# Patient Record
Sex: Female | Born: 1990 | Race: White | Hispanic: No | Marital: Single | Smoking: Current every day smoker
Health system: Southern US, Community
[De-identification: ages and names within clinical notes are randomized; demographics above are authoritative.]

## PROBLEM LIST (undated history)

## (undated) DIAGNOSIS — K589 Irritable bowel syndrome without diarrhea: Secondary | ICD-10-CM

## (undated) DIAGNOSIS — G4700 Insomnia, unspecified: Secondary | ICD-10-CM

## (undated) DIAGNOSIS — F17200 Nicotine dependence, unspecified, uncomplicated: Secondary | ICD-10-CM

## (undated) DIAGNOSIS — F329 Major depressive disorder, single episode, unspecified: Secondary | ICD-10-CM

## (undated) DIAGNOSIS — F32A Depression, unspecified: Secondary | ICD-10-CM

## (undated) DIAGNOSIS — F988 Other specified behavioral and emotional disorders with onset usually occurring in childhood and adolescence: Secondary | ICD-10-CM

## (undated) DIAGNOSIS — F410 Panic disorder [episodic paroxysmal anxiety] without agoraphobia: Secondary | ICD-10-CM

## (undated) DIAGNOSIS — J45909 Unspecified asthma, uncomplicated: Secondary | ICD-10-CM

## (undated) DIAGNOSIS — F419 Anxiety disorder, unspecified: Secondary | ICD-10-CM

## (undated) HISTORY — DX: Major depressive disorder, single episode, unspecified: F32.9

## (undated) HISTORY — PX: COLONOSCOPY: SHX174

## (undated) HISTORY — DX: Insomnia, unspecified: G47.00

## (undated) HISTORY — DX: Unspecified asthma, uncomplicated: J45.909

## (undated) HISTORY — DX: Nicotine dependence, unspecified, uncomplicated: F17.200

## (undated) HISTORY — DX: Irritable bowel syndrome, unspecified: K58.9

## (undated) HISTORY — DX: Other specified behavioral and emotional disorders with onset usually occurring in childhood and adolescence: F98.8

## (undated) HISTORY — DX: Panic disorder (episodic paroxysmal anxiety): F41.0

## (undated) HISTORY — DX: Anxiety disorder, unspecified: F41.9

## (undated) HISTORY — DX: Depression, unspecified: F32.A

---
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
## 2009-05-21 ENCOUNTER — Encounter: Admission: RE | Admit: 2009-05-21 | Discharge: 2009-05-21 | Payer: Self-pay | Admitting: Family Medicine

## 2009-12-24 ENCOUNTER — Ambulatory Visit: Payer: Self-pay | Admitting: Family Medicine

## 2009-12-24 DIAGNOSIS — G4700 Insomnia, unspecified: Secondary | ICD-10-CM | POA: Insufficient documentation

## 2009-12-24 DIAGNOSIS — J45909 Unspecified asthma, uncomplicated: Secondary | ICD-10-CM | POA: Insufficient documentation

## 2009-12-24 DIAGNOSIS — F410 Panic disorder [episodic paroxysmal anxiety] without agoraphobia: Secondary | ICD-10-CM

## 2009-12-24 HISTORY — DX: Insomnia, unspecified: G47.00

## 2009-12-24 HISTORY — DX: Panic disorder (episodic paroxysmal anxiety): F41.0

## 2009-12-24 HISTORY — DX: Unspecified asthma, uncomplicated: J45.909

## 2010-02-08 ENCOUNTER — Ambulatory Visit: Payer: Self-pay | Admitting: Family Medicine

## 2010-02-08 ENCOUNTER — Telehealth: Payer: Self-pay | Admitting: Family Medicine

## 2010-04-22 ENCOUNTER — Encounter: Payer: Self-pay | Admitting: Family Medicine

## 2010-04-22 ENCOUNTER — Ambulatory Visit
Admission: RE | Admit: 2010-04-22 | Discharge: 2010-04-22 | Payer: Self-pay | Source: Home / Self Care | Attending: Family Medicine | Admitting: Family Medicine

## 2010-04-22 ENCOUNTER — Other Ambulatory Visit: Payer: Self-pay | Admitting: Family Medicine

## 2010-04-22 DIAGNOSIS — R1084 Generalized abdominal pain: Secondary | ICD-10-CM | POA: Insufficient documentation

## 2010-04-22 LAB — CBC WITH DIFFERENTIAL/PLATELET
Basophils Absolute: 0 10*3/uL (ref 0.0–0.1)
Basophils Relative: 0.5 % (ref 0.0–3.0)
Eosinophils Absolute: 0.3 10*3/uL (ref 0.0–0.7)
Eosinophils Relative: 3 % (ref 0.0–5.0)
HCT: 43.5 % (ref 36.0–46.0)
Hemoglobin: 14.8 g/dL (ref 12.0–15.0)
Lymphocytes Relative: 33.2 % (ref 12.0–46.0)
Lymphs Abs: 2.8 10*3/uL (ref 0.7–4.0)
MCHC: 34.1 g/dL (ref 30.0–36.0)
MCV: 91.8 fl (ref 78.0–100.0)
Monocytes Absolute: 0.5 10*3/uL (ref 0.1–1.0)
Monocytes Relative: 5.9 % (ref 3.0–12.0)
Neutro Abs: 4.9 10*3/uL (ref 1.4–7.7)
Neutrophils Relative %: 57.4 % (ref 43.0–77.0)
Platelets: 223 10*3/uL (ref 150.0–400.0)
RBC: 4.74 Mil/uL (ref 3.87–5.11)
RDW: 13.2 % (ref 11.5–14.6)
WBC: 8.5 10*3/uL (ref 4.5–10.5)

## 2010-04-22 LAB — SEDIMENTATION RATE: Sed Rate: 4 mm/hr (ref 0–22)

## 2010-04-27 ENCOUNTER — Telehealth: Payer: Self-pay | Admitting: Family Medicine

## 2010-05-05 ENCOUNTER — Encounter: Payer: Self-pay | Admitting: Family Medicine

## 2010-05-05 ENCOUNTER — Ambulatory Visit
Admission: RE | Admit: 2010-05-05 | Discharge: 2010-05-05 | Payer: Self-pay | Source: Home / Self Care | Attending: Family Medicine | Admitting: Family Medicine

## 2010-05-05 ENCOUNTER — Ambulatory Visit
Admission: RE | Admit: 2010-05-05 | Discharge: 2010-05-05 | Payer: Self-pay | Source: Home / Self Care | Attending: Women's Health | Admitting: Women's Health

## 2010-05-05 DIAGNOSIS — R197 Diarrhea, unspecified: Secondary | ICD-10-CM | POA: Insufficient documentation

## 2010-05-05 NOTE — Progress Notes (Unsigned)
  Subjective:    Patient ID: Leslie [name], female    DOB: 17-Dec-1990, 19 y.o.   MRN: [id number]  HPIPatient is seen with persistent frequent loose stools sometimes up to 10 per day. She's had several years of frequent abdominal symptoms including bilateral lower abdominal cramp-like pains in predominantly frequent loose stools. Rare issues of constipation. No bloody stools. Previous EGD and colonoscopy around 2009. We do not have those records at this time. She notices that several things exacerbate her symptoms including smoking, caffeine, carbonated beverages, and dairy products. She has tried align without improvement.  Activity with minimal improvement. She tried dicyclomine with minimal improvement. Had some improvement first couple days but none since then.  Denies any weight changes. Appetite is fair. She is afraid he eats certain foods that exacerbate. Denies any fever, arthralgias, skin rashes.    Review of Systems  Constitutional: Negative for fever, chills, fatigue and unexpected weight change.  Respiratory: Negative for cough and shortness of breath.   Cardiovascular: Negative for chest pain.  Gastrointestinal: Positive for abdominal pain and diarrhea. Negative for nausea (occasional nausea but no vomiting), vomiting, constipation, blood in stool, abdominal distention, anal bleeding and rectal pain.  Musculoskeletal: Negative for arthralgias.  Skin: Negative for color change and rash.       Objective:   Physical Exam  Constitutional: She is oriented to person, place, and time. She appears well-developed and well-nourished.  HENT:  Head: Normocephalic and atraumatic.  Neck: No thyromegaly present.  Cardiovascular: Normal rate and regular rhythm.   Pulmonary/Chest: No respiratory distress. She has no wheezes. She has no rales.  Abdominal: Soft. Bowel sounds are normal. She exhibits no distension and no mass. There is no tenderness. There is no rebound and no guarding.    Lymphadenopathy:    She has no cervical adenopathy.  Neurological: She is alert and oriented to person, place, and time.  Skin: No rash noted.          Assessment & Plan:  Patient has several year history of intermittent abdominal cramping and diarrhea per dominance. Previous endoscopy unrevealing. Question IBS. As previously seen gastroenterologist and not clear if other lab work done. Recent CBC and sedimentation rate unremarkable. No red flags such as weight loss. We'll plan referral back to gastroenterology. Discussed avoidance of dietary triggers. Short-term trial of Lomotil 2 tablets every 6 hours p.r.n. For severe symptoms.  She will call with name of GI she has seen previously.

## 2010-05-05 NOTE — Patient Instructions (Signed)
Irritable Bowel Syndrome   (Spastic Colon)   Irritable Bowel Syndrome (IBS) is caused by a disturbance of normal bowel function. Other terms used are spastic colon, mucous colitis, and irritable colon. It does not require surgery, nor does it lead to cancer. There is no cure for IBS. But with proper diet, stress reduction, and medication, you will find that your problems (symptoms) will gradually disappear or improve. IBS is a common digestive disorder. It usually appears in late adolescence or early adulthood. Women develop it twice as often as men.   CAUSES   After food has been digested and absorbed in the small intestine, waste material is moved into the colon (large intestine). In the colon, water and salts are absorbed from the undigested products coming from the small intestine. The remaining residue, or fecal material, is held for elimination. Under normal circumstances, gentle, rhythmic contractions on the bowel walls push the fecal material along the colon towards the rectum. In IBS, however, these contractions are irregular and poorly coordinated. The fecal material is either retained too long, resulting in constipation, or expelled too soon, producing diarrhea.   SYMPTOMS   The most common symptom of IBS is pain. It is typically in the lower left side of the belly (abdomen). But it may occur anywhere in the abdomen. It can be felt as heartburn, backache, or even as a dull pain in the arms or shoulders. The pain comes from excessive bowel-muscle spasms and from the buildup of gas and fecal material in the colon. This pain:   Can range from sharp belly (abdominal) cramps to a dull, continuous ache.   Usually worsens soon after eating.   Is typically relieved by having a bowel movement or passing gas.   Abdominal pain is usually accompanied by constipation. But it may also produce diarrhea. The diarrhea typically occurs right after a meal or upon arising in the morning. The stools are typically soft and  watery. They are often flecked with secretions (mucus).   Other symptoms of IBS include:   Bloating.   Loss of appetite.  Heartburn.   Feeling sick to your stomach (nausea).  Belching   Vomiting  Gas.    IBS may also cause a number of symptoms that are unrelated to the digestive system:   Fatigue.   Headaches.  Anxiety   Shortness of breath  Difficulty in concentrating.   Dizziness.    These symptoms tend to come and go.   DIAGNOSIS   The symptoms of IBS closely mimic the symptoms of other, more serious digestive disorders. So your caregiver may wish to perform a variety of additional tests to exclude these disorders. He/she wants to be certain of learning what is wrong (diagnosis). The nature and purpose of each test will be explained to you.   TREATMENT   A number of medications are available to help correct bowel function and/or relieve bowel spasms and abdominal pain. Among the drugs available are:   Mild, non-irritating laxatives for severe constipation and to help restore normal bowel habits.   Specific anti-diarrheal medications to treat severe or prolonged diarrhea.   Anti-spasmodic agents to relieve intestinal cramps.   Your caregiver may also decide to treat you with a mild tranquilizer or sedative during unusually stressful periods in your life.   The important thing to remember is that if any drug is prescribed for you, make sure that you take it exactly as directed. Make sure that your caregiver knows how well it worked   for you.   HOME CARE INSTRUCTIONS   Avoid foods that are high in fat or oils. Some examples are:heavy cream, butter, frankfurters, sausage, and other fatty meats.   Avoid foods that have a laxative effect, such as fruit, fruit juice, and dairy products.   Cut out carbonated drinks, chewing gum, and "gassy" foods, such as beans and cabbage. This may help relieve bloating and belching.   Bran taken with plenty of liquids may help relieve constipation.   Keep track of what foods seem to  trigger your symptoms.   Avoid emotionally charged situations or circumstances that produce anxiety.   Start or continue exercising.   Get plenty of rest and sleep.   MAKE SURE YOU:   Understand these instructions.   Will watch your condition.   Will get help right away if you are not doing well or get worse.   Document Released: 03/28/2005 Document Re-Released: 08/14/2008   Exit[employer]® Patient Information ©2011 [employer].

## 2010-05-06 ENCOUNTER — Telehealth: Payer: Self-pay | Admitting: Family Medicine

## 2010-05-10 ENCOUNTER — Telehealth: Payer: Self-pay | Admitting: Family Medicine

## 2010-05-13 NOTE — Progress Notes (Signed)
Summary: wcb FOR APPT 10-31  Phone Note Call from Patient Call back at Home Phone [phone]   Caller: vm Summary of Call: Lately anxiety especially at night.  Have to be up super early for new job.  Hard to take med Xanax & be awake morning.  Requesting Klonipin again, has been on in past.  Don't have same side effects with that.  [employer].     Initial call taken by: Rudy Jew, RN,  February 08, 2010 12:04 PM  Follow-up for Phone Call        needs follow up to discuss. Follow-up by: [name] MD,  February 08, 2010 1:17 PM  Additional Follow-up for Phone Call Additional follow up Details #1::        wcb ABOUT 4:00 OV.   Additional Follow-up by: Rudy Jew, RN,  February 08, 2010 2:04 PM

## 2010-05-13 NOTE — Letter (Signed)
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   [place], [place] [address]   Phone: [phone]  Fax: [phone]    April 22, 2010   Employee:  Kelli Churn, daughter of Katniss Weedman.    To Whom It May Concern:   For Medical reasons, please excuse the above named employee from work for the following dates:  Start:   03/23/2011  End:   03/23/2011  If you need additional information, please feel free to contact our office.         Sincerely,        [name], MD

## 2010-05-13 NOTE — Assessment & Plan Note (Signed)
Summary: med issue/per pam//ccm   Vital Signs:  Patient profile:   20 year old female Menstrual status:  regular Weight:      100 pounds Temp:     98.4 degrees F oral BP sitting:   100 / 64  (left arm) Cuff size:   regular  Vitals Entered By: Sid Falcon LPN (February 08, 2010 4:00 PM)  History of Present Illness: Patient for followup regarding anxiety issues. History of questionable panic attacks. She has been very reluctant to go on antidepressant medication such as sertraline. Has used low-dose alprazolam but recently has felt this made her more sedated when she took at night. In the past she has used clonazepam and had fewer side effects. She is requesting change of medication. No alcohol use.  Allergies (verified): No Known Drug Allergies  Past History:  Past Medical History: Last updated: 12/24/2009 Asthma Panic Attack Insomnia lactose intolerance PMH reviewed for relevance  Review of Systems  The patient denies anorexia, weight loss, and depression.    Physical Exam  General:  Well-developed,well-nourished,in no acute distress; alert,appropriate and cooperative throughout examination Neck:  No deformities, masses, or tenderness noted. Lungs:  Normal respiratory effort, chest expands symmetrically. Lungs are clear to auscultation, no crackles or wheezes. Heart:  Normal rate and regular rhythm. S1 and S2 normal without gallop, murmur, click, rub or other extra sounds. Psych:  normally interactive, good eye contact, not anxious appearing, and not depressed appearing.     Impression & Recommendations:  Problem # 1:  PANIC ATTACK (ICD-300.01)  Her updated medication list for this problem includes:    Clonazepam 0.5 Mg Tabs (Clonazepam) ..... One by mouth two times a day as needed severe anxiety  Complete Medication List: 1)  Clonazepam 0.5 Mg Tabs (Clonazepam) .... One by mouth two times a day as needed severe anxiety  Patient Instructions: 1)  Please schedule  a follow-up appointment in 3 months .  Prescriptions: CLONAZEPAM 0.5 MG TABS (CLONAZEPAM) one by mouth two times a day as needed severe anxiety  #60 x 3   Entered and Authorized by:   [name] MD   Signed by:   [name] MD on 02/08/2010   Method used:   Print then Give to Patient   RxID:   [id number]    Orders Added: 1)  Est. Patient Level III [53664]

## 2010-05-13 NOTE — Letter (Signed)
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         Sincerely,        [name], MD

## 2010-05-13 NOTE — Progress Notes (Signed)
Summary: Ongoing abd pain, GI referral  Phone Note Call from Patient   Caller: Patient Call For: [name] MD Summary of Call: GI MD Charlott Rakes, Deboraha Sprang [facility] Needs appt ASAP to a GI MD Still abd. Pain [phone]  Initial call taken by: [facility] CMA AAMA,  May 06, 2010 3:13 PM  Follow-up for Phone Call        I recommend setting up appt with Dr Bosie Clos to re-evaluate, if she is agreeable.  Will set  up. Follow-up by: [name] MD,  May 06, 2010 5:25 PM  Additional Follow-up for Phone Call Additional follow up Details #1::        Message left fot pt referral has been made Additional Follow-up by: Sid Falcon LPN,  May 06, 2010 5:36 PM

## 2010-05-13 NOTE — Assessment & Plan Note (Signed)
Summary: fup//ccm   Vital Signs:  Patient profile:   20 year old female Menstrual status:  regular Weight:      104 pounds BMI:     18.06 Temp:     98.5 degrees F oral BP sitting:   110 / 70  (left arm) Cuff size:   regular  Vitals Entered By: Sid Falcon LPN (May 05, 2010 4:34 PM) CC: abd pain   History of Present Illness: Patient seen with long history of intermittent lower abdominal cramping sensation and frequent loose stools. Reported workup by gastroenterologist with EGD and colonoscopy 2009 reportedly unremarkable. ? of IBS.  Patient has sometimes up to 10 loose non-watery and nonbloody stools per day. Exacerbated by things like smoking, caffeine, carbonated beverages, and dairy products. She has tried probiotics without improvement. Recent dicyclomine helped for a few days but not subsequently. She has been eating very little for diet recently with crackers, bread, and soup.  She has tried lactose avoidance in past without improvement.  No recent weight change. Denies any bloody stools, fever, arthralgias, or skin rashes.  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    Physical Exam  General:  Well-developed,well-nourished,in no acute distress; alert,appropriate and cooperative throughout examination Mouth:  Oral mucosa and oropharynx without lesions or exudates.  Teeth in good repair. Neck:  No deformities, masses, or tenderness noted. Lungs:  Normal respiratory effort, chest expands symmetrically. Lungs are clear to auscultation, no crackles or wheezes. Heart:  Normal rate and regular rhythm. S1 and S2 normal without gallop, murmur, click, rub or other extra sounds. Abdomen:  soft, non-tender, normal bowel sounds, no distention, no masses, no hepatomegaly, and no splenomegaly.   Skin:  no rashes and no suspicious lesions.   Cervical Nodes:  No lymphadenopathy noted Psych:  normally interactive, good eye contact, not anxious appearing, and not depressed appearing.     Impression & Recommendations:  Problem # 1:  DIARRHEA (ICD-787.91) chronic and intermittent.  ? of IBS.  Discussed possible dietary triggers.  Short term trial of Lomotil and refer back to GI. she will call back with name of GI she has seen previously. Her updated medication list for this problem includes:    Lomotil 2.5-0.025 Mg Tabs (Diphenoxylate-atropine) .Marland Kitchen... 2 tablets qid as needed diarrhea  Complete Medication List: 1)  Clonazepam 0.5 Mg Tabs (Clonazepam) .... One by mouth two times a day as needed severe anxiety 2)  Yaz 3-0.02 Mg Tabs (Drospirenone-ethinyl estradiol) .... Daily as directed 3)  Lomotil 2.5-0.025 Mg Tabs (Diphenoxylate-atropine) .... 2 tablets qid as needed diarrhea  Patient Instructions: 1)  Call with name of gastroenterologist you have seen previously. Prescriptions: LOMOTIL 2.5-0.025 MG TABS (DIPHENOXYLATE-ATROPINE) 2 tablets qid as needed diarrhea  #40 x 1   Entered and Authorized by:  [name] MD   Signed by:   [name] MD on 05/05/2010   Method used:   Print then Give to Patient   RxID:   [id number]    Orders Added: 1)  Est. Patient Level III [14782]

## 2010-05-13 NOTE — Assessment & Plan Note (Signed)
Summary: ab issues//ccm   Vital Signs:  Patient profile:   20 year old female Menstrual status:  regular Weight:      102 pounds Temp:     98.0 degrees F oral BP sitting:   110 / 72  (left arm) Cuff size:   regular  Vitals Entered By: Sid Falcon LPN (April 22, 2010 2:43 PM)  History of Present Illness: Patient seen with abdominal pain somewhat migratory and intermittent. Location is bilateral and lower abdomen. Similar symptoms going back at least 3 years ago. Extensive testing at that time with CAT scan, EGD, and colonoscopy unremarkable. She tends to have more frequent stools sometimes alternating with constipation. Worse with caffeine and dairy products. No bloody stools. Appetite and weight are stable. No fever.  Denies any specific stressors.  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    Physical Exam  General:  Well-developed,well-nourished,in no acute distress; alert,appropriate and  cooperative throughout examination Mouth:  Oral mucosa and oropharynx without lesions or exudates.  Teeth in good repair. Neck:  No deformities, masses, or tenderness noted. Lungs:  Normal respiratory effort, chest expands symmetrically. Lungs are clear to auscultation, no crackles or wheezes. Heart:  Normal rate and regular rhythm. S1 and S2 normal without gallop, murmur, click, rub or other extra sounds. Abdomen:  soft, non-tender, normal bowel sounds, no distention, no masses, no guarding, no rigidity, no rebound tenderness, no abdominal hernia, no hepatomegaly, and no splenomegaly.   Extremities:  No clubbing, cyanosis, edema, or deformity noted with normal full range of motion of all joints.   Neurologic:  alert & oriented X3 and cranial nerves II-XII intact.   Skin:  no rashes.     Impression & Recommendations:  Problem # 1:  ABDOMINAL PAIN, GENERALIZED (ICD-789.07)  suspect probable IBS.  Extensive workup in past.  Alternating constipation and diarrhea go along with likely dx and no  red flags such as weight loss, fever, etc,  Labs as below and trial of Bentyl for as needed use. Avoidance of aggravating foods.  GI refer if persists.  Orders: Venipuncture (04540) Specimen Handling (98119) TLB-CBC Platelet - w/Differential (85025-CBCD) TLB-Sedimentation Rate (ESR) (85652-ESR)  Complete Medication List: 1)  Clonazepam 0.5 Mg Tabs (Clonazepam) .... One by mouth two times a day as needed severe anxiety 2)  Yaz 3-0.02 Mg Tabs (Drospirenone-ethinyl estradiol) .... Daily as directed 3)  Dicyclomine Hcl 20 Mg Tabs (Dicyclomine hcl) .... One by mouth q 6 hours as needed  Patient Instructions: 1)  Follow up promptly for any fever, worsening abdominal pain, bloody stools, or any weight loss Prescriptions: DICYCLOMINE HCL 20 MG TABS (DICYCLOMINE HCL) one by mouth q 6 hours as needed  #40 x 1   Entered and Authorized by:   [name] MD  Signed by:   [name] MD on  04/22/2010   Method used:   Electronically to        [id number]       [address]       [place], [place]  [address]       Ph: [phone] or [phone]       Fax: [phone]   RxID:   [id number]    Orders Added: 1)  Venipuncture [43329] 2)  Specimen Handling [99000] 3)  TLB-CBC Platelet - w/Differential [85025-CBCD] 4)  TLB-Sedimentation Rate (ESR) [85652-ESR] 5)  Est. Patient Level IV [51884]

## 2010-05-13 NOTE — Assessment & Plan Note (Signed)
Summary: BRAND NEW PT/TO EST/CJR   Vital Signs:  Patient profile:   20 year old female Menstrual status:  regular LMP:     12/17/2009 Height:      63.75 inches Weight:      97 pounds Temp:     98.8 degrees F oral Pulse rate:   88 / minute Pulse rhythm:   regular Resp:     12 per minute BP sitting:   118 / 70  (left arm) Cuff size:   regular  Vitals Entered By: Sid Falcon LPN (December 24, 2009 3:25 PM) CC: New to establish, panic attacks LMP (date): 12/17/2009     Menstrual Status regular Enter LMP: 12/17/2009   History of Present Illness: Patient seen to establish care. She has history of reported panic disorder. She's taken alprazolam 1 mg as needed. At one point was on Effexor but had significant side effects. Denies history of depression. She has some chronic intermittent insomnia.  Drinks freq [employer].  Has difficulty falling asleep.   Reported lactose intolerance. Also has history of ADD but does not take Adderall regularly. No known drug allergies.  Family history as recorded.  Social history she is out of school and working. No alcohol use. Smokes less than one quarter pack cigarettes per day.  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 Smoker Alcohol use-yes Regular exercise-no  Risk Factors: Exercise: no (12/24/2009)  Risk Factors: Smoking Status: current  (12/24/2009) Packs/Day: 0.25 (12/24/2009)  Past Medical History: Asthma Panic Attack Insomnia lactose intolerance  Past Surgical History: Colonscopy 2009  endscopy 2009 PMH-FH-SH reviewed for relevance  Family History: Family History of Alcoholism/Addiction, fathers brothers Family History High cholesterol, mother, maternal grandfather Hypertension, heart disease, diabetes, maternal grandfather emotional illness, mother, grandmother  Social History: Occupation:  Conservation officer, 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    Physical Exam  General:  Well-developed,well-nourished,in no acute distress; alert,appropriate and cooperative throughout examination Mouth:  Oral mucosa and oropharynx without lesions or exudates.  Teeth in good repair. Neck:  No deformities, masses, or tenderness noted. Lungs:  Normal respiratory effort, chest expands symmetrically. Lungs are clear to auscultation, no crackles or wheezes. Heart:  Normal rate and regular rhythm. S1 and S2 normal without gallop, murmur, click, rub or other extra sounds. Psych:  normally interactive, good eye contact, not anxious appearing, and not depressed appearing.     Impression & Recommendations:  Problem # 1:  PANIC ATTACK (ICD-300.01) refilled alprazolam but discussed that we need to look at controlling meds such as Sertraline if her anxiety is increasing in freq or severity. Her updated medication list for this problem includes:    Alprazolam 1 Mg Tabs (Alprazolam) ..... One by mouth q 8 hours as needed panic attack  Problem #  2:  ADD (ICD-314.00)  Problem # 3:  INSOMNIA, TRANSIENT  (ICD-780.52) sleep hygiene discussed.  Reduce caffeine gradually.  Complete Medication List: 1)  Alprazolam 1 Mg Tabs (Alprazolam) .... One by mouth q 8 hours as needed panic attack  Patient Instructions: 1)  Please schedule a follow-up appointment in 6 months .  Prescriptions: ALPRAZOLAM 1 MG TABS (ALPRAZOLAM) one by mouth q 8 hours as needed panic attack  #30 x 2   Entered and Authorized by:   [name] MD   Signed by:   [name] MD on 12/24/2009   Method used:   Print then Give to Patient   RxID:   [id number]

## 2010-05-13 NOTE — Progress Notes (Signed)
Summary: no early refill  Phone Note Call from Patient Call back at Home Phone [phone]   Caller: Patient Call For: [name] MD Summary of Call: [facility]) Pt is early on Klonipin 5 days early due to stomach pain? Initial call taken by: [facility] CMA AAMA,  April 27, 2010 11:59 AM  Follow-up for Phone Call        she should not be taking this excessive to prescribed dose. Follow-up by: [name] MD,  April 27, 2010 12:30 PM  Additional Follow-up for Phone Call Additional follow up Details #1::        notified pt. Additional Follow-up by: Lynann Beaver CMA AAMA,  April 27, 2010 1:49 PM

## 2010-05-19 NOTE — Progress Notes (Signed)
  Phone Note Call from Patient Call back at [facility] Phone [phone]   Caller: Patient Call For: [name] MD Summary of Call: Pt needs another appt with someone other than [facility].......they say she owes a bill and she does not want to see them.  Initial call taken by: [facility] CMA AAMA,  May 10, 2010 12:00 PM  Follow-up for Phone Call        go ahead and set up with [facility] if they are willing to see. Follow-up by: [name] MD,  May 10, 2010 1:09 PM  Additional Follow-up for Phone Call Additional follow up Details #1::        Sent request to [facility] to set up consult. Additional Follow-up by: Corky Mull,  May 10, 2010 1:59 PM

## 2010-05-31 ENCOUNTER — Ambulatory Visit (INDEPENDENT_AMBULATORY_CARE_PROVIDER_SITE_OTHER): Payer: Managed Care, Other (non HMO) | Admitting: Family Medicine

## 2010-05-31 ENCOUNTER — Encounter: Payer: Self-pay | Admitting: Family Medicine

## 2010-05-31 DIAGNOSIS — R109 Unspecified abdominal pain: Secondary | ICD-10-CM

## 2010-05-31 MED ORDER — HYDROCODONE-ACETAMINOPHEN 5-325 MG PO TABS: 1.0000 | ORAL_TABLET | Freq: Four times a day (QID) | ORAL | Status: AC | PRN

## 2010-05-31 NOTE — Progress Notes (Signed)
  Subjective:    Patient ID: Leslie [name], female    DOB: 12/12/1990, 20 y.o.   MRN: [id number]  HPI  Patient seen with recurrent diffuse bilateral lower abdominal cramps. Refer to prior visit in January.  She's had previous extensive workup with CT scan, EGD, and colonoscopy a few years ago unrevealing. Recent CBC and sedimentation rate unremarkable.    Symptom location is lower abdomen bilaterally with cramp-like discomfort which is severe at times. Some mild relief with bentyl. Has also tried Lomotil for intermittent diarrhea symptoms with some relief. No significant bloody stools. No appetite or weight changes. No fever or chills. She has tried probiotics occasionally without much improvement. Patient was previously seen gastroenterologist with [facility] but is requesting referral to another group.    family history of endometriosis. Patient does not correlate current symptoms with menstrual cycle. As previously noted exacerbation with dairy products and caffeine which she tries to avoid.   Review of Systems  Constitutional: Negative for fever, chills, diaphoresis, activity change and unexpected weight change.  HENT: Negative for congestion.   Respiratory: Negative for shortness of breath.   Cardiovascular: Negative for chest pain and palpitations.  Gastrointestinal: Positive for nausea, abdominal pain and diarrhea. Negative for vomiting, blood in stool and abdominal distention.  Genitourinary: Negative for dysuria, frequency and hematuria.  Skin: Negative for rash.  Hematological: Negative for adenopathy.       Objective:   Physical Exam  patient is alert thin relatively healthy-appearing female in no distress  Oropharynx is clear with moist mucosa  Neck supple no adenopathy no mass  Chest clear to auscultation Heart regular rhythm and rate with no murmur Abdomen is nondistended and normal bowel sounds. Soft with mild to moderate bilateral lower quadrant tenderness. No guarding or  rebound. No organomegaly noted       Assessment & Plan:   recurrent lower abdominal pain with associated intermittent diarrhea sometimes alternating with constipation. Gastroenterology referral in process. Continue Bentyl for symptomatic relief.   We discussed possible exacerbating factors such as stress and possible dietary factors. She's tried probiotics without relief.

## 2010-06-21 ENCOUNTER — Telehealth: Payer: Self-pay | Admitting: Family Medicine

## 2010-06-21 NOTE — Telephone Encounter (Signed)
Pt needs a referral expedite  to see dr stark for abd pain

## 2010-06-22 ENCOUNTER — Telehealth: Payer: Self-pay | Admitting: *Deleted

## 2010-06-22 NOTE — Telephone Encounter (Signed)
Faxed request to refill Clonazepam 0.5, one po bid as needed for severe anxiety Last OV 05/31/10 [facility]

## 2010-06-23 MED ORDER — CLONAZEPAM 0.5 MG PO TABS: 0.5000 mg | ORAL_TABLET | Freq: Two times a day (BID) | ORAL | Status: DC | PRN

## 2010-06-23 NOTE — Telephone Encounter (Signed)
Rx called in 

## 2010-06-23 NOTE — Telephone Encounter (Signed)
May refill for 6 months.  She should not be exceeding one po bid.

## 2010-06-24 ENCOUNTER — Other Ambulatory Visit: Payer: Self-pay | Admitting: *Deleted

## 2010-06-25 NOTE — Telephone Encounter (Signed)
Opened by mistake.

## 2010-07-01 ENCOUNTER — Telehealth: Payer: Self-pay | Admitting: *Deleted

## 2010-07-01 MED ORDER — HYDROCODONE-ACETAMINOPHEN 5-325 MG PO TABS: 1.0000 | ORAL_TABLET | Freq: Four times a day (QID) | ORAL | Status: DC | PRN

## 2010-07-01 NOTE — Telephone Encounter (Signed)
Pt requesting refill of Hydrocodone 5-325 for stomach problems.  She has been referred to GI, has appt at [facility] on Monday 3/26.  She ran out of meds and is concerned about the weekend

## 2010-07-01 NOTE — Telephone Encounter (Signed)
Rx called in, pt informed

## 2010-07-01 NOTE — Telephone Encounter (Signed)
We can do limited refill of #20 tablets (Vicodin 5/325 mg 1-2 po q 6 hours prn) but should not be using regularly-could constipate and worsen symptoms.

## 2010-07-21 ENCOUNTER — Telehealth: Payer: Self-pay | Admitting: *Deleted

## 2010-07-21 NOTE — Telephone Encounter (Signed)
hydrocodone 5-325 refill request #20.  Last filled 07/01/10, sig 1-2 tabs every 6 hours prn pain Last OV 05/23/10 Faxed request from [facility]

## 2010-07-21 NOTE — Telephone Encounter (Signed)
Pt is asking for refill of Hydrocodone for abd pain.   Saw GI but they will not give her meds until testing is done.  Has appt with Dr. Caryl Never tomorrow for joint pain and anxiety with abd pain.

## 2010-07-21 NOTE — Telephone Encounter (Signed)
Recommend assess for pain meds at that time.

## 2010-07-22 ENCOUNTER — Encounter: Payer: Self-pay | Admitting: Family Medicine

## 2010-07-22 ENCOUNTER — Ambulatory Visit (INDEPENDENT_AMBULATORY_CARE_PROVIDER_SITE_OTHER): Payer: Managed Care, Other (non HMO) | Admitting: Family Medicine

## 2010-07-22 VITALS — BP 112/68 | Temp 98.7°F | Wt 107.0 lb

## 2010-07-22 DIAGNOSIS — R062 Wheezing: Secondary | ICD-10-CM

## 2010-07-22 DIAGNOSIS — L309 Dermatitis, unspecified: Secondary | ICD-10-CM

## 2010-07-22 DIAGNOSIS — L259 Unspecified contact dermatitis, unspecified cause: Secondary | ICD-10-CM

## 2010-07-22 DIAGNOSIS — K589 Irritable bowel syndrome without diarrhea: Secondary | ICD-10-CM

## 2010-07-22 MED ORDER — TRIAMCINOLONE ACETONIDE 0.1 % EX CREA: TOPICAL_CREAM | Freq: Two times a day (BID) | CUTANEOUS | Status: DC

## 2010-07-22 MED ORDER — ALBUTEROL SULFATE HFA 108 (90 BASE) MCG/ACT IN AERS: 2.0000 | INHALATION_SPRAY | Freq: Four times a day (QID) | RESPIRATORY_TRACT | Status: DC | PRN

## 2010-07-22 NOTE — Progress Notes (Signed)
  Subjective:    Patient ID: Leslie [name], female    DOB: 03/24/1991, 20 y.o.   MRN: [id number]  HPI Patient here to discuss several items as follows  History of chronic intermittent abdominal pain. Pain is poorly localized and diffuse and crampy like. Has seen multiple gastroenterologists most recently in [place]. Recent upper GI with small bowel follow-through reportedly normal. She has tried multiple medications including Lomotil, dicyclomine, and hyoscyamine although minimal improvement.  Recently with [facility] and received Toradol injection which did not help much. His IV at 10 or weight changes. Test had more diarrhea than constipation. No bloody diarrhea. She has taken occasional hydrocodone in the past and we have explained is not a good treatment for IBS type symptoms.  She is tried a lactose-free diet without much improvement. Also tried gluten-free diet for a brief while and possibly had slight improvement in symptoms. It is not clear if recent gastroenterology workup included any lab work.  Previous CBC and sedimentation rate here normal  Patient has history of chronic anxiety. Currently on Klonopin 0.5 mg twice daily. She is episodes of anxiety associated with bronchospasm. Has taken albuterol in the past is requesting refill.  Questionable history of asthma. Wheezing episodes or few and far between. She does not appear to have persistent asthma. She does do to smoke some.  New issue of slightly scaly rash upper extremities and scattered on trunk.  Rash is pruritic. One area correlates to area of belt buckle.   Review of Systems  Constitutional: Negative for fever, activity change, appetite change and unexpected weight change.  Respiratory: Negative for cough and shortness of breath.   Cardiovascular: Negative for chest pain, palpitations and leg swelling.  Gastrointestinal: Positive for abdominal pain and diarrhea. Negative for nausea, vomiting, constipation and  blood in stool.  Genitourinary: Negative for dysuria.  Neurological: Negative for dizziness and headaches.  Hematological: Negative for adenopathy.       Objective:   Physical Exam  Constitutional: She is oriented to person, place, and time. She appears well-developed and well-nourished. No distress.  Neck: Neck supple. No thyromegaly present.  Cardiovascular: Normal rate, regular rhythm and normal heart sounds.   No murmur heard. Pulmonary/Chest: Effort normal and breath sounds normal. No respiratory distress. She has no wheezes. She has no rales.  Abdominal: Soft. Bowel sounds are normal. She exhibits no distension and no mass. There is no tenderness. There is no rebound and no guarding.  Musculoskeletal: She exhibits no edema.  Lymphadenopathy:    She has no cervical adenopathy.  Neurological: She is alert and oriented to person, place, and time.  Skin:       Patient has scaly rash and patches scattered on the upper extremities and trunk. No vesicles. No pustules.          Assessment & Plan:  #1 long history of intermittent abdominal pain with intermittent associated diarrhea and occasional constipation. Has been referred to gastroenterologist. Melina Fiddler recommended against further use of hydrocodone. We've discussed conservative measures for treating IBS multiple times in the past. She is encouraged followup with gastroenterologist. #2 eczematous rash. Triamcinolone 0.1% cream twice daily as needed #3 chronic anxiety. Continue Klonopin 0.5 mg twice daily #4 mild intermittent asthma. Refill Provera 2 puffs every 4 hours as needed patient strongly encouraged stop smoking

## 2010-07-27 ENCOUNTER — Other Ambulatory Visit: Payer: Self-pay | Admitting: *Deleted

## 2010-07-27 NOTE — Telephone Encounter (Signed)
Refill once but this should be used sparingly for her abdominal pain.

## 2010-07-27 NOTE — Telephone Encounter (Signed)
Faxed request to fill Hydrocodone 5-325, #20.  Last filled 07/01/10

## 2010-07-28 MED ORDER — HYDROCODONE-ACETAMINOPHEN 5-325 MG PO TABS: 1.0000 | ORAL_TABLET | Freq: Four times a day (QID) | ORAL | Status: DC | PRN

## 2010-08-04 ENCOUNTER — Encounter: Payer: Self-pay | Admitting: Family Medicine

## 2010-08-12 ENCOUNTER — Ambulatory Visit (INDEPENDENT_AMBULATORY_CARE_PROVIDER_SITE_OTHER): Payer: Managed Care, Other (non HMO) | Admitting: Women's Health

## 2010-08-12 DIAGNOSIS — N949 Unspecified condition associated with female genital organs and menstrual cycle: Secondary | ICD-10-CM

## 2010-08-13 ENCOUNTER — Ambulatory Visit: Payer: Managed Care, Other (non HMO) | Admitting: Women's Health

## 2010-08-24 ENCOUNTER — Ambulatory Visit (INDEPENDENT_AMBULATORY_CARE_PROVIDER_SITE_OTHER): Payer: Managed Care, Other (non HMO) | Admitting: Gynecology

## 2010-08-24 DIAGNOSIS — R82998 Other abnormal findings in urine: Secondary | ICD-10-CM

## 2010-08-24 DIAGNOSIS — Z113 Encounter for screening for infections with a predominantly sexual mode of transmission: Secondary | ICD-10-CM

## 2010-08-24 DIAGNOSIS — N949 Unspecified condition associated with female genital organs and menstrual cycle: Secondary | ICD-10-CM

## 2010-08-27 ENCOUNTER — Ambulatory Visit (INDEPENDENT_AMBULATORY_CARE_PROVIDER_SITE_OTHER): Payer: Managed Care, Other (non HMO) | Admitting: Gynecology

## 2010-08-27 ENCOUNTER — Other Ambulatory Visit: Payer: Managed Care, Other (non HMO)

## 2010-08-27 DIAGNOSIS — R1032 Left lower quadrant pain: Secondary | ICD-10-CM

## 2010-08-27 DIAGNOSIS — N949 Unspecified condition associated with female genital organs and menstrual cycle: Secondary | ICD-10-CM

## 2010-08-27 NOTE — Discharge Summary (Signed)
NAME:  Leslie [name], Leslie [name] NO.:  [id number]   MEDICAL RECORD NO.:  [id number]          PATIENT TYPE:  INP   LOCATION:  0199                          FACILITY:  [facility]   PHYSICIAN:  Beverly Milch, MD     DATE OF BIRTH:  Sep 12, 1990   DATE OF ADMISSION:  01/16/2004  DATE OF DISCHARGE:  01/21/2004                                 DISCHARGE SUMMARY   IDENTIFYING DATA:  This 20 year old female, ninth grade student at [employer], was admitted from [facility] Access and Intake  where mother was directed by the patient's therapist, Windee Knox-Hietkamp,  as the patient was being progressively dangerously disruptive in her  behavior.  The patient's depression had resolved since her December 16, 2003  [facility] discharge, though mother felt the patient was more  irritable and impulsive.  Mother felt the Effexor was the cause of the  patient's problems.  The patient stated the medication was working well.  The patient indicated that starting and stopping cigarette smoking was a  problem.  The patient had been carving her boyfriend's initials into her  forearm and refused to cooperate with mother or therapist to stop such  behavior.  She had run away from home requiring the police to locate her.  The patient reported in the intake department that she was hearing voices  that sounded evil as though she could see people around the home or school.  She reported seeing people that may not have been there around home and  school January 14, 2004 and reported that voices were calling her name the  day before admission.  The patient is attempting to maintain a [place] social  style and dress.  She expects to be an actress in the future.  She is truant  with decline in their grades and has in-school suspension and detention.  She was admitted primarily because of the auditory hallucinations with the  inability to other secure and define the safety contract  and more  predictable behavior.  She acknowledged drinking to the point of alcohol  intoxication recently and smoking up to two packs per day of cigarettes.  She does not like school and does not approve of parental limits.   HISTORY OF PRESENT ILLNESS:  The patient had been significantly depressed  when hospitalized December 09, 2003 through December 15, 2003 at the  [facility].  At that time, she had failed to improve on a six-  week trial of Lexapro 10 mg daily with ongoing psychotherapy.  She had a  plan at that time to cut her wrist and bleed to death in the bathtub.  She  had a one-year decline in motivation and capacity to function, organizing  some of the reason for her depression as having a friend move to [place].  She was talking to the friend on the phone about suicide recently.  The  patient has been tested for learning disabilities and ADHD by the school  with the only conclusion being that she would warrant some school-based  interventions.  The patient thinks that she has ADHD  and mother thinks the  patient has some learning problem that could be corrected.  She has some  exercise-induced asthma.  There is a maternal family history of depression  including some suicidality.  The patient has been progressively oppositional  and rebellious.  She failed course work in the eighth grade but did pass the  EOG exam.  She was concluded to have dysthymic disorder during her last  hospitalization with atypical features.   INITIAL MENTAL STATUS EXAM:  Dr. Ladona Ridgel noted that the patient had  significant hysteroid features that undermine problem-solving and  interpersonal relations.  She projects blame rather than solving problems.  She minimized the significance of and displaced responsibility for her  runaway behavior and cigarette smoking.  She expected to be released from  the hospital to continue what she was doing.  She had no psychotic symptoms.  She had no hypomanic  or manic symptoms, though her mood was significantly  improved, having mild dysphoria at the most.  She was noted to casually flip  from one idea to the next with concentration seeming poor by Dr. Ladona Ridgel.   LABORATORY DATA:  Urine drug screen was negative with urine creatinine 76  mg/dl.  Urine hCG was negative.   HOSPITAL COURSE AND TREATMENT:  General medical exam, by Vic Ripper,  P.A.-C., noted that the patient acknowledged alcohol intoxication four days  before and smoking five cigarettes daily.  The patient suggested that mother  is depressed.  The patient reported a daily headache and does wear glasses.  She reported a history of asthma, predominantly exertional, and menarche at  age 66 with regular menses.  She denied sexual activity.  She does have a  thin habitus.  She was Tanner stage 3.  Admission weight was 94 pounds with  height of 63 inches.  Her admission weight, during her last hospitalization  end of August, was 100 with discharge weight 98 pounds and height of 63-3/4  inches.  Her discharge weight, at this time, was 94 pounds.  Admission blood  pressure was 102/71 with heart rate of 75 (supine) and 112/59 with heart  rate of 111 (standing).  Vital signs were normal throughout hospital stay  and, at the time of discharge, her sitting blood pressure was 109/73 with  heart rate of 89 and standing blood pressure 104/71 with heart rate of 119.  The patient was provided inpatient treatment for 48 hours, monitoring the  course of symptoms.  The patient maintained that she was asymptomatic and  ready for discharge.  She continued to maintain that cigarette smoking was  the only conflict for she and mother.  Mother cried over the patient's  behavior in family work and concluded that the medications must be changed.  The patient indicated that she did not intend to change.  She did not manifest mania or hypomania.  Though she had disregard and inattention at  times, she  does not have a typical history for ADHD.  She does seem to  desire treatment for such and seems to glorify her substance use and  disruptive behavior patterns.  The patient had been on Effexor for one month  and abrupt discontinuation was undertaken in order to address mother's  concerns that the patient had side effects from the medication.  The patient  did remain cooperative throughout the treatment program but without self-  directed initiative or intent to affect change.  With persistence, she did  define that her only auditory hallucinations had  been one year ago when she  was feeling depressed and cut her arm and thought she heard voices telling  her to kill herself as she was walking down the hall.  She acknowledged that  she has no other actual hallucinations or misperceptions.  She had no SSRI  discontinuation symptoms.  She was feeling somewhat chronically dysphoric at  the time of discharge but mother was pleased that the patient was off of the  Effexor.  Mother felt that the patient was less impulsive and more socially  appropriate.  Mother did work with school staff and administration for  helping the patient catch up at school with a plan in place by time of  discharge.  The final family therapy session addressed ways for the patient  to return home with behavioral shutdown but to re-earn privileges.  The  family requires the patient to disengage from drug-using friends.  The  patient understood the plan including consequences of juvenile detention for  running away.  She was irritable with father over consequences of losing CDs  and posters.  She cried briefly and decathexis as she disengaged from her  dramatic defiance and agreed to be more responsible at home.  Behavioral and  family work are underway.  She is felt to have some chronic dysthymic  disorder and her mood has been better over the last month but she has used  the improved mood to act out more rather than to  improve family and school  functioning.  She does not present significant risk for bipolar disorder at  this time.  However, she has lost 4-6 pounds on Effexor.  She and mother  conclude with all processing that they must wait several weeks to assess  baseline mood again and behavioral impulsivity before they can make a  decision about other pharmacotherapy.  We discussed Wellbutrin as an option  with mother and patient wishing to focus most on assistance for attention  span.  She was free of suicidal ideation and self-injurious behavior at the  time of discharge and was having no hallucinations.   FINAL DIAGNOSES:   AXIS I:  1.  Dysthymic disorder, early onset, severe with atypical features.  2.  Oppositional defiant disorder with hysteroid features.  3.  Rule out psychoactive substance abuse not otherwise specified      (provisional diagnosis). 4.  Rule out attention-deficit hyperactivity disorder, inattentive-type      (provisional diagnosis).  5.  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  6.  Parent-child problem.  7.  Other specified family circumstances.  8.  Other interpersonal problem.   AXIS II:  Rule out learning disorder not otherwise specified (provisional  diagnosis).   AXIS III:  1.  Thin stature but no evidence of eating disorder.  2.  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  3.  Exercise-induced asthma.  4.  Cigarette smoking.   AXIS IV:  Stressors:  Family--severe, acute and chronic; school--severe,  acute and chronic; phase of life--severe, acute and chronic.   AXIS V:  Global Assessment of Functioning on admission 45; highest in last  year 72; discharge Global Assessment of Functioning 55.   DISCHARGE MEDICATIONS:  1.  The patient did receive Vistaril 50 mg nightly during hospital stay and      is prescribed quantity #30 with no refill to use as needed at bedtime      for insomnia.  2.  She has her albuterol inhaler that she uses p.r.n.  as her own  home      supply directions.   FOLLOW UP:  Her Effexor is discontinued.  She plans appointment with Dr.  Milford Cage January 29, 2004 at 13:45 relative to possible Wellbutrin or  other treatment for attention related and dysthymic disorder symptoms,  though they will monitor over the course of the next two weeks the target  symptoms and consequences for such.  Continues therapy with Sofie Rower-  Hietkamp which is most important based on the course of hospital symptom and  treatment response patterns.  The patient will discontinue cigarettes and  alcohol.  Crisis and safety plans are outlined if needed and behavioral  contracts were established for the home.  The parents plan juvenile  detention if the patient runs away again.  We planned to discontinue CDs and  posters if the patient continues oppositional defiance and consequences.  She follows a weight gain diet and has no restrictions on physical activity  otherwise.  She required no seclusion, restraint or equivalent of such  during hospital stay as documented at the request of nursing administration.     Glen   GJ/MEDQ  D:  01/23/2004  T:  01/23/2004  Job:  [id number]   cc:   Melinda Crutch Knox-Heitkamp  [address]  [place], [place] [address]   Jasmine Pang, M.D.  Fax: [phone]

## 2010-08-27 NOTE — H&P (Signed)
NAME:  Leslie [name], BUEGE NO.:  [id number]   MEDICAL RECORD NO.:  [id number]          PATIENT TYPE:  INP   LOCATION:  0199                          FACILITY:  [facility]   PHYSICIAN:  Carolanne Grumbling, M.D.    DATE OF BIRTH:  [birth year]   DATE OF ADMISSION:  01/16/2004  DATE OF DISCHARGE:                         PSYCHIATRIC ADMISSION ASSESSMENT   CHIEF COMPLAINT:  Leslie [name] is a 20 year old female.  Leslie [name] was admitted to  the hospital after making cuts on her arm.   HISTORY LEADING UP TO THE PRESENT ILLNESS:  Leslie [name] said she was carving her  boyfriend's name in her arm.  She was not trying to kill herself.  She has  no suicidal ideation or thoughts.  She says she does not even feel  depressed.  Since she was here before, she is feeling much better and  happier.  She says that her mother says that she made a contract when she  left the last time not to cut on herself.  She denies that she ever made  such a contract and she thinks it is unfair that she is back in the hospital  when she does not want to kill herself and is actually feeling better.   FAMILY, SCHOOL AND SOCIAL ISSUES:  She says she has a new boyfriend who she  thinks is very supportive of her.  It was his name she was carving on her  arm.  She says do not really approve of the boy but they are trying to get  to know him and willing to give him a chance.  She continues to do fairly  dramatic and irresponsible things such as cutting school on a fairly regular  basis it sounds like.  She was out one day drinking with her friends, which  she has never done before, enough to get drunk she says.  She continues to  smoke cigarettes she says, anywhere from 2 cigarettes to a pack a day if she  can get them.  She is not doing her school work.  She says she thinks she is  attention deficit disorder and is going to get medicines, probably next week  if she were not here, to see if that would help.  She apparently continues  to  be oppositional and defiant towards her parents.  She does not like  school, she said, because it seems to her teachers do not like her and they  seem to blame everything she does not even do, and consequently she has no  great interest in going.  She denied any history of abuse, physically or  sexually.   PREVIOUS PSYCHIATRIC TREATMENT:  She was an inpatient here in September.  She is seeing an outpatient psychiatrist and an outpatient therapist.   ALCOHOL, DRUG AND LEGAL ISSUES:  She smokes up to a pack of cigarettes per  day, she says.  She says she drank alcohol last week for the first time and  only time.   MEDICAL PROBLEMS, ALLERGIES, AND MEDICATIONS:  She has asthma historically.  She has no known allergies to medications.  She takes albuterol as  needed,  Effexor and hydroxyzine.   MENTAL STATUS EXAM:  At the time of the initial evaluation revealed an  alert, oriented girl  who came to the interview willingly and was  cooperative.  She was appropriately dressed and groomed.  She was fairly  histrionic.  She seemed to not make any great connections between her  behavior and the consequences.  She tended to put the blame on other people  rather than on herself.  She denied any suicidal thoughts.  She admitted to  making cuts on her arm.  She denied making a contract not to cut on herself.  She admitted running away.  There was no evidence of any thought disorder or  other psychosis.  Short term and long term memory were intact as measured by  her ability to recall recent and remote events in her own life..  Judgment  currently seemed impaired by her general attitude and lack of  responsibility.  Insight was minimal.  Intellectual functioning seemed at  least average.  Concentration was poor as she tended to be distracted and to  flip from idea to idea.   PATIENT ASSETS:  Leslie [name] says she will be cooperative.   ADMISSION DIAGNOSIS:   AXIS I:  1.  Mood disorder not otherwise  specified.  2.  Rule out attention deficit hyperactivity disorder.  3.  Oppositional-defiant disorder.   AXIS II:  Deferred.   AXIS III:  Asthma.   AXIS IV:  Moderate.   AXIS V:  45/60.   INITIAL PLAN OF CARE:  Estimated length of hospitalization is about 3 to 5  days.  The plan is to stabilize to the point where she is taking some  responsibility for her behavior and has a plan for dealing with her stress  more effectively by the time of discharge.  Dr. Marlyne Beards will be the  attending.     Maree Erie   GT/MEDQ  D:  01/17/2004  T:  01/17/2004  Job:  [id number]

## 2010-08-27 NOTE — H&P (Signed)
NAME:  Leslie [name], Leslie [name]                      ACCOUNT NO.:  [id number]   MEDICAL RECORD NO.:  [id number]                   PATIENT TYPE:  INP   LOCATION:  0101                                 FACILITY:  [facility]   PHYSICIAN:  Beverly Milch, MD                  DATE OF BIRTH:  1991/03/09   DATE OF ADMISSION:  12/09/2003  DATE OF DISCHARGE:                         PSYCHIATRIC ADMISSION ASSESSMENT   IDENTIFYING DATA:  This 20 year old female, ninth grade student at [employer], is admitted emergently voluntarily on referral from [facility] for inpatient stabilization of suicide risk and depression.  The  patient had two psychotherapy sessions with psychotherapist following a six-  week trial of Lexapro and five appointments with Dr. Lucrezia Starch, which was  not successful for depression.  The patient had significant stress and  conflict with family, particularly over school failure last year, passing  her EOGs, though not her grades.  School is now starting again and the  patient is staying in her room at home except to come to meals.   HISTORY OF PRESENT ILLNESS:  The patient has at least a one-year history of  decline in motivation and capacity to function.  This seems to be  significantly emotional, though she has also apparently been tested for  learning disabilities and possibly ADHD.  The patient describes  hypersensitivity to the comments or actions of others and rejection  sensitivity.  She seems to have some leaden fatigue and diminished energy.  Her sleeping and eating are preserved even though she is thin.  She reports  sleeping six hours nightly and awakens readily.  She became more severely  depressed in the mid to late winter of 2005.  A good female friend moved to  [place] at which time the patient considered killing herself by overdose.  The patient started cutting herself in March of 2005 and has scars on the  left forearm from such.  The patient  currently describes that she was  imminent in her plan to die by cutting her wrist deeply in the bathtub but  was talking to her friend in [place] on the phone and the friend talked her  out of it.  The patient will not contract for safety currently.  She  indicates that both she and mother attend the therapy appointments and that  they talk separately with the therapist.  The patient suggests that therapy  is getting started well but she is still depressed even though she discounts  the significance or assistance from Lexapro as being of no benefit at all.  The patient is therefore ambivalent about any other pharmacotherapy.  She  seems somewhat confident that this school year will go better, though she  cannot say why.  She seems to exhibit denial and distortion at times.  She  is stressed that brother has stated he hates her and, when he stated this,  December 06, 2003, the patient became even more suicidal.  The patient stays  in her room at home except to come out for meals.  She has significant  conflict with both parents, who she interprets yell and cuss at her.  The  patient reports an illusion of hearing her mother saying things to her even  when mother is not there.  The patient does not acknowledge manic symptoms.  She does not have other psychotic symptoms or paranoia.  She does not  acknowledge other specific anxiety.  In fact, she states she wants to be an  actress, singer or model.  The patient does not acknowledge any substance  abuse including no tobacco.  She has had no contact with alcohol or illicit  drugs.  She has had no organic central nervous system trauma.   PAST MEDICAL HISTORY:  The patient does wear eyeglasses for reading  suggesting hyperopia.  She has exercise-induced asthma treated with as-  needed albuterol inhaler.  She had chicken pox in 1999.  She had menarche at  age 59 with menses regular and she is not sexually active.  Last menses was  December 09, 2003.   She has some scars on her left forearm which are very  faint and pink.  She reports frequent headaches but does have glasses.  She  has a thin body habitus and does not acknowledge purposely restricting food  or purging.  She does not acknowledge other eating or body image compulsions  or obsessions.  She has no medication allergies.  She is off of Lexapro  currently, having taken it for six weeks without benefit from Dr. Piedad Climes.  She does have an albuterol inhaler.  She uses 2 puffs every 4-6 hours as  needed for exercise-induced asthma.  She has had no seizures or syncope.  She has had no heart murmur or arrhythmia.   REVIEW OF SYSTEMS:  The patient denies difficulty with gait, gaze or  countenance.  She denies exposure to communicable disease or toxins.  She  denies rash, jaundice or purpura.  She denies chest pain, palpitations or  presyncope.  She denies abdominal pain, nausea, vomiting or diarrhea.  There  is no dysuria or arthralgia.   IMMUNIZATIONS:  Up to date.   FAMILY HISTORY:  The patient has a family history of depression on the  maternal side of the family, apparently the extended family.  She has a 66-  year-old brother with whom she is having conflict and who apparently has  stated he hated her according to the patient.  She feels that parents yell  at her and swear at her at times.  They do not acknowledge other family  history of major psychiatric disorder.  The parents would perceive the  patient to be progressively rebellious and oppositional over the last 6-12  months.   SOCIAL AND DEVELOPMENTAL HISTORY:  The patient has no known complications or  consequences of gestation, delivery or neonatal period.  She has no  developmental delays known, though she reportedly has had some testing more  recently that suggested the need for some services for learning disorder symptoms.  The patient implies that she may have had generated some concern  that ADD needs to be ruled  out.  However, it seems that the patient's  academic failure has surrounding the eighth grade school year during a time  when she has been having identity conflicts and dysthymic symptoms.  Still,  oppositional defiant disorder must also be considered.  She will attend the  ninth grade at [employer] this fall.  She did pass her EOG exams  last eighth grade school year even though she failed her course grades.  She  denies sexual activity.  She denies use of tobacco, alcohol or illicit  drugs.   ASSETS:  The patient is social outside the family.   MENTAL STATUS EXAM:  Height is 63-3/4 inches and weight is 100 pounds.  Blood pressure is 132/87 with heart rate of 101 (sitting) and 119/75 with  heart rate of 115 (standing).  Neurological screen reveals no significant  abnormalities.  She is alert and oriented with speech intact.  Gait and gaze  are intact.  She has no abnormal involuntary movements.  She has no  neurologic soft signs or pathologic reflexes.  Muscle strengths and tone are  normal.  Alternating motion rates are intact.  The patient has more ability  to discuss affective and family issues than she does academic issues,  becoming distant and disengaging as school discussions are attempted.  She  has denial and likely distortion present as part of her atypical depressive  and/or borderline/hysteroid traits.  She has no definite eating disorder  diathesis.  She has rejection sensitivity and hypersensitivity to the  comments or actions of others.  Eating and sleep are described as being  preserved, though she is thin.  She aspires to be an Musician, singer or  model.  She has moderate to severe dysphoria, though with reactive mood.  She becomes severely dysphoric as school is discussed.  She seems  hypersensitive about school issues.  Although she can discuss family issues  and affective concerns more, she does not establish understanding of the  cause and course of  suicide decompensation.  The patient does not contract  for safety.  She has specific suicidal ideation and plan.  She does not have  manic or psychotic symptoms at this time.  She has more internalizing than  externalizing symptoms, though with the family she has significant  externalizing symptoms.   IMPRESSION:   AXIS I:  1.  Dysthymic disorder, early onset, severe with atypical features.  2.  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  3.  Rule out attention-deficit hyperactivity disorder, inattentive-type      (provisional diagnosis).  4.  Rule out oppositional defiant disorder (provisional diagnosis).  5.  Parent-child problem.  6.  Other specified family circumstances.   AXIS II:  Rule out possible learning disorder not otherwise specified  (provisional diagnosis).   AXIS III:  1.  Thin stature.  2.  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  3.  Exercise-induced asthma.   AXIS IV:  Stressors:  Family--severe, acute and subacute; school--severe,  subacute; phase of life--severe, acute and chronic.   AXIS V:  Global Assessment of Functioning 38; highest in last year 72.   PLAN:  The patient is admitted for inpatient adolescent psychiatric and  multidisciplinary, multimodal behavioral health treatment in a team-based  program at a locked psychiatric unit.  Wellbutrin or Effexor will be  considered though Wellbutrin may be best considering the academic  difficulties.  The patient is not immediately able to be confident or  capable about Wellbutrin.  It appears necessary to initiate cognitive  behavioral therapy, identity consolidation therapies, individuation and  separation therapies, anger management and family therapy.  Wellbutrin and  Effexor can be addressed during the course of that and likely Wellbutrin  started if willing.  There is no evidence  of eating disorder  contraindication.   ESTIMATED LENGTH OF STAY:  Five to seven days.  Target symptoms for   discharge include stabilization of suicide risk and mood, stabilization of  family communication and containment, generalization of the capacity for  safe, effective participation in outpatient treatment, and clarification of  optimal approach to school initiation.                                               Beverly Milch, MD    GJ/MEDQ  D:  12/10/2003  T:  12/10/2003  Job:  [id number]

## 2010-08-27 NOTE — Discharge Summary (Signed)
NAME:  Leslie [name], Leslie [name]                      ACCOUNT NO.:  [id number]   MEDICAL RECORD NO.:  [id number]                   PATIENT TYPE:  INP   LOCATION:  0101                                 FACILITY:  [facility]   PHYSICIAN:  Beverly Milch, MD                  DATE OF BIRTH:  [birth year]   DATE OF ADMISSION:  12/09/2003  DATE OF DISCHARGE:  12/15/2003                                 DISCHARGE SUMMARY   IDENTIFICATION:  A 20 year old female, 9th grade student at [employer], was admitted emergently, voluntarily on referral from [facility] for inpatient stabilization of suicide risk and depression.  The  patient had a s6-week trial of Lexapro 10 mg daily with Dr. Lucrezia Starch and  psychotherapy twice with the referring therapist, still manifesting  depression fixation, now planning to cut her wrist in the bathtub to bleed  to death.  For full details please see the typed admission assessment.   SYNOPSIS OF PRESENT ILLNESS:  The patient has a one year decline in  motivation and capacity to function of chronic depressive origin, though  exacerbated particularly over the last 6 months.  The patient had a best  female friend move to [place] at which time she planned to kill herself by  overdose in the winter of 2005.  The patient started cutting after that and  has scars on the left forearm from such.  The patient's friend in [place]  talked her out of suicide now by phone and the patient is brought by family  for hospitalization.  The patient has been tested for learning disabilities  and possibly ADHD and is apparently felt to warrant school-based  interventions as well.  However her depressive symptoms have been the most  consequential and dangerous.  Sleeping and eating were preserved, though  self deprecation and irritable despair are episodically and intermittently  intense and overwhelming.  The patient has reactive mood but no manic  symptoms.  She has no psychotic  symptoms but has some identity diffusion and  conflict.  The patient wants to be an actress, singer or model, however she  is overwhelmed that her brother has stated he hates her and parents are  yelling at her to try to facilitate functioning.  The patient thinks she  will do well in school but is overwhelmed starting school.  She has a thin  body habitus but does not having eating disorder symptoms, though she is  asked about this frequently.  Father had the same.  She has an albuterol  inhaler if needed for exercise induced asthma.  She has frequent headaches  and does wear eyeglasses.  There is family history of depression on the  maternal side of extended family.  She has become progressively rebellious  and oppositional over the last 6-12 months.  She did pass EOG exams in the  8th grade even though she failed her course  work.   INITIAL MENTAL STATUS EXAM:  The patient is more able to discuss family and  emotional issues than academic issues, becoming irritable and overwhelmed as  academic issues are addressed.  She has no eating disorder diathesis  clinically.  She has prominent rejection sensitivity as well as  hypersensitivity to the comments or actions of others.  She has moderate to  severe atypical dysphoria with reactive mood and easy outbursts of anger.  However she generally suppresses her anger, resulting in more despair.  She  has had recurrent suicidal ideation and plans.   LABORATORY FINDINGS:  CBC on admission was normal, with MCHC borderline  elevated at 34.1 with upper limit of normal 34.  White count was normal at  8800, hemoglobin 14.6, MCV 87 and platelet count 285,000.  Comprehensive  metabolic panel was normal, with sodium 137, potassium 3.6, fasting glucose  88, creatinine 0.7, calcium 9.7, albumin 4.1, AST 20 and ALT 13.  GGT was  normal at 13.  Free T4 was normal at 1.26 and TSH at 2.243.  Urine HCG was  negative.  Urine drug screen was negative, with  creatinine of 44 mg/dl.  Urinalysis was normal with specific gravity of 1.009.  RPR was nonreactive.  Urine probes for gonorrhea and chlamydia trichomatous by DNA amplification  were both negative.   HOSPITAL COURSE AND TREATMENT:  General medical exam by Vic Ripper,  PA-C noted that the patient told her counselor that she felt like killing  herself after conflicts with brother.  There are no medication allergies.  She has no current asthma as it is usually exercise induced.  She had  menarche at age 5 and is not sexually active, though menses are regular.  She is Tanner stage 5.  She has healed scars on the left forearm from  previous self cutting.  Admission height was 63.75 inches and weight to 100  pounds, with discharge weight 98 pounds.  Admission blood pressure was  132/87 with heart rate of 101 sitting and 119/75 with heart rate of 114  standing.  At the time of discharge, supine blood pressure was 102/71 with  heart rate of 75 and standing blood pressure 112/59 with heart rate of 111.  The patient and mother became overwhelmed about the patient's hospital stay,  similar to being overwhelmed prior to admission with school and family  issues.  Mother requested pharmacotherapy quickly, though the patient was  somewhat initially ambivalent.  She did start Effexor XR which was titrated  up to 150 mg every morning over the course of the hospital stay and was  tolerated well.  She had no side effects from the medication and she did  gradually improve in mood.  She maintained the histrionic and passive-  aggressive style interpersonally, particularly about continued  hospitalization as well as well as treatment goals and expectations.  She  initially resolved family conflicts but then restored these somewhat over  issues of aftercare in the course of her termination work.  The patient  tends to be controlling in this way while seeking the security of someone to take charge and  get her better.  We addressed all these issues that  undermined therapeutic success over time with the patient and family over  the course of hospital stay.  By the time of discharge, the patient was  capable of identifying the source of her decompensation and ongoing  treatment need.  She could accept that she would attend therapy sooner than  later, though she could not self initiate such.  She thought she should wait  3 weeks before attending therapy, but the family will set it up sooner.  She  participated in group, milieu, behavioral, individual, family, special  education, occupational and therapeutic recreational as well as anger  management and substance abuse prevention during her hospital stay.  She did  not require her albuterol inhaler during the hospital stay.  The patient's  mood and interpersonal problem solving were improved by the time of  discharge but still awaiting long-term resolution and ongoing therapy.  She  did require Vistaril for sleep the evening prior to discharge.  She  described some right knee para patellar pain on the day before discharge  after and during recreational therapy, although she stated the pain has been  there for a long time, well before admission and just flares up sometimes  with activity.  This suggested the need for quadriceps strengthening as  tolerated, though she was encouraged to attend an appointment with her  primary care physician to address understanding and treatment for such.  She  was asymptomatic by the time of discharge in regard to all these issues  except for ambivalence and passive-aggressive approach to family resolution  of conflicts generated over time.  She initially pledged to return to school  but mother called the following day that the patient had over eaten and was  vomiting from such following Labor Day and could not attend school on  December 16, 2003.  We addressed mother's authority as a parent to  investigate  and clarify what needed to be done and that she could add to the  school excuse already given, though we understand that she cannot attend  school when she is vomiting.  Steps toward resolution from parent management  training were addressed.  The patient had no suicide related or other side  effects from the Effexor otherwise and was tolerating it well by the time of  discharge.  Dose was advanced, targeting physiologic and initial symptoms  response indicators that would predict pharmacotherapeutic efficacy over  time after Lexapro had already failed.  Mother disapproved of Wellbutrin  pharmacotherapy because mother had taken Wellbutrin in the past and found  that it gave her suicide related side effects.  The patient was discharged  free of suicidal ideation, in improved condition, agreeable to aftercare,  with the family somewhat over determined in pleasing the patient at the time  of discharge as they were very pleased with her progress in communication and relations with all family members including younger brother by the time  of discharge.   FINAL DIAGNOSES:  AXIS 1:  1.  Dysthymic disorder, early onset, severe, with atypical features.  2.  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  3.  Rule out attention deficit hyperactivity disorder, inattentive type -      doubtful (provisional diagnosis).  4.  Rule out evolving oppositional-defiant disorder (provisional diagnosis).  5.  Parent-child problem.  6.  Other specified family circumstances.  7.  Other interpersonal problem.  AXIS II:  Rule out learning disorder not otherwise specified (provisional diagnosis).  AXIS III:  1.  Thin stature.  2.  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  3.  Exercised induced asthma.  4.  Patellar knee pain possibly associated with under developed quadriceps      mechanism.  AXIS IV:  Stressors:  Family - severe, acute and subacute; school - severe, subacute;  phase of life -  severe, acute and chronic; friend moving away - severe,  chronic.  AXIS V:  Global assessment of function on admission 38 with highest in last year  estimated at 72 and discharge global assessment of function was 55.   PLAN:  The patient was discharged to both parents in improved condition,  free of suicidal ideation.  They were educated on the diagnoses as well as  medications, including side effects, risks and proper use, including FDA  guidelines.  She is prescribed the following:  1.  Effexor XR 150 mg XR every morning, quantity #30 with 1 refill written.  2.  Hydroxyzine 50 mg capsule, to take 1 at bedtime if needed for insomnia,      quantity #30 with no refill prescribed.  3.  Albuterol inhaler 2 puffs every 4-6 hours as needed for exercise induced      asthma.  She follows a regular healthy nutrition weight gain diet and has no  restrictions on physical activity.  Crisis and safety plans are outlined  including for the re integration to school and family.  She will see [facility] for individual and family therapy, having to call for an  appointment at [phone] after release as the family is required to call.  She will see Dr. Milford Cage similarly for medication follow-up, with  mother having to call to make the arrangements.  Nursing service requests  for administrative reasons that I document that the patient required no  seclusion, restraint or equivalent of such during the hospital stay.                                               Beverly Milch, MD    GJ/MEDQ  D:  12/16/2003  T:  12/16/2003  Job:  [id number]   cc:   [facility]  [address]  [place], [place] [address]   Jasmine Pang, M.D.  Fax: [phone]

## 2010-09-15 ENCOUNTER — Encounter: Payer: Self-pay | Admitting: Family Medicine

## 2010-09-15 ENCOUNTER — Ambulatory Visit (INDEPENDENT_AMBULATORY_CARE_PROVIDER_SITE_OTHER): Payer: Managed Care, Other (non HMO) | Admitting: Family Medicine

## 2010-09-15 VITALS — BP 110/72 | Temp 98.6°F | Wt 108.0 lb

## 2010-09-15 DIAGNOSIS — K589 Irritable bowel syndrome without diarrhea: Secondary | ICD-10-CM

## 2010-09-15 MED ORDER — DIPHENOXYLATE-ATROPINE 2.5-0.025 MG PO TABS: 1.0000 | ORAL_TABLET | Freq: Four times a day (QID) | ORAL | Status: DC | PRN

## 2010-09-15 NOTE — Patient Instructions (Signed)
Follow up if bloody stools persist or if you develop any fever or worsening abdominal pain.

## 2010-09-15 NOTE — Progress Notes (Signed)
  Subjective:    Patient ID: Leslie [name], female    DOB: [birth year], 19 y.o.   MRN: [id number]  HPI Patient has history of chronic abdominal pain and probable  IBS, diarrhea predominant. She's been seen recently by gastroenterologist. She reportedly had several labs and stool studies normal and what sounds like upper GI with small bowel follow-through. No evidence for inflammatory bowel disease. Presumed irritable bowel syndrome. Prescribed Xifaxan but did not take secondary to cost. Actually doing much better during the past month until past couple days. She's had some watery stools. Occasional bright red blood. No pain with stools. No fever. Appetite is poor generally but no weight changes.  Mother has endometriosis. There've been discussion of exploratory laparoscopy to rule out endometriosis but she has high deductible and cannot afford at this time. Recent pelvic ultrasound which showed no ovarian cyst. She has migratory abdominal pains intermittently.   Review of Systems  Cardiovascular: Negative for chest pain and leg swelling.  Gastrointestinal: Positive for diarrhea and blood in stool. Negative for nausea, vomiting, abdominal pain, constipation and rectal pain.  Genitourinary: Negative for dysuria.       Objective:   Physical Exam  Constitutional: She appears well-developed and well-nourished.  HENT:  Mouth/Throat: Oropharynx is clear and moist.  Cardiovascular: Normal rate, regular rhythm and normal heart sounds.   Pulmonary/Chest: Effort normal and breath sounds normal. No respiratory distress. She has no wheezes. She has no rales.  Abdominal: Soft. Bowel sounds are normal. She exhibits no distension and no mass. There is no tenderness. There is no rebound and no guarding.          Assessment & Plan:  Recurrent diarrhea in a patient with probable IBS. Refilled Lomotil 1 tablet every 6 hours as needed. She is encouraged to follow up with gastroenterologist. Follow up  especially if she has any recurrent bloody stools or new symptoms.

## 2010-09-23 ENCOUNTER — Other Ambulatory Visit: Payer: Self-pay | Admitting: *Deleted

## 2010-09-23 MED ORDER — CLONAZEPAM 0.5 MG PO TABS: 0.5000 mg | ORAL_TABLET | Freq: Two times a day (BID) | ORAL | Status: DC | PRN

## 2010-09-23 NOTE — Telephone Encounter (Signed)
Refill on klonopin. Pt is going on vacation 6/28 and will be out of medication while on vacation. Can we call in a rx for her?

## 2010-09-23 NOTE — Telephone Encounter (Signed)
Pt requested written Rx to take to [place], written, will pick up tomorrow

## 2010-09-23 NOTE — Telephone Encounter (Signed)
May refill #30 

## 2010-09-27 ENCOUNTER — Other Ambulatory Visit: Payer: Self-pay | Admitting: *Deleted

## 2010-09-27 MED ORDER — HYDROCODONE-ACETAMINOPHEN 5-325 MG PO TABS: 1.0000 | ORAL_TABLET | Freq: Four times a day (QID) | ORAL | Status: DC | PRN

## 2010-09-27 NOTE — Telephone Encounter (Signed)
Rx called in 

## 2010-09-27 NOTE — Telephone Encounter (Signed)
Faxed request to fill Hydrocodone 5-325, last filled 07-28-10 #20 with 0 refills

## 2010-09-27 NOTE — Telephone Encounter (Signed)
May refill #20 only.  She has used this very sparingly.

## 2010-09-28 ENCOUNTER — Telehealth: Payer: Self-pay | Admitting: *Deleted

## 2010-09-28 NOTE — Telephone Encounter (Signed)
Pt has question about klonopin and request to speak with nurse.

## 2010-09-29 NOTE — Telephone Encounter (Signed)
Attempt to call - VM - LMTCB if questions that I can help with - Dr. Caryl Never and Harriett Sine out of office until tuesday

## 2010-10-04 ENCOUNTER — Telehealth: Payer: Self-pay | Admitting: *Deleted

## 2010-10-04 NOTE — Telephone Encounter (Signed)
Faxed refill request for Hydrocodone 5-325 #20, take 1 tab every 6 hours as needed for pain, last filled 09-27-10

## 2010-10-05 NOTE — Telephone Encounter (Signed)
This is too soon.  She should not be using this regularly for her IBS symptoms.  If poorly controlled we need her to follow up with her GI physician.

## 2010-10-06 NOTE — Telephone Encounter (Signed)
Pt informed on VM

## 2010-11-03 ENCOUNTER — Ambulatory Visit (INDEPENDENT_AMBULATORY_CARE_PROVIDER_SITE_OTHER): Payer: Managed Care, Other (non HMO) | Admitting: Family Medicine

## 2010-11-03 ENCOUNTER — Encounter: Payer: Self-pay | Admitting: Family Medicine

## 2010-11-03 VITALS — BP 102/70 | Temp 98.6°F | Wt 103.0 lb

## 2010-11-03 DIAGNOSIS — F988 Other specified behavioral and emotional disorders with onset usually occurring in childhood and adolescence: Secondary | ICD-10-CM

## 2010-11-03 MED ORDER — AMPHETAMINE-DEXTROAMPHETAMINE 20 MG PO TABS: 20.0000 mg | ORAL_TABLET | Freq: Two times a day (BID) | ORAL | Status: DC

## 2010-11-03 MED ORDER — CLONAZEPAM 0.5 MG PO TABS: 0.5000 mg | ORAL_TABLET | Freq: Two times a day (BID) | ORAL | Status: DC

## 2010-11-03 NOTE — Progress Notes (Signed)
  Subjective:    Patient ID: Leslie [name], female    DOB: [birth year], 20 y.o.   MRN: [id number]  HPI Patient has a long history of ADD diagnosed several years ago and is requesting consideration for getting back on Adderall which has taken previously. No history or appetite suppression. She is preparing to start school this fall also be working full-time. She has major problems with focusing and completing tasks.  Has previously taken Adderall 20 mg one half to one tablet twice daily.  Weight is stable. She has history of probable IBS. She has seen multiple gastroenterologists an extensive workup previously. Symptoms are relatively stable at this time.   Review of Systems  Constitutional: Negative for appetite change and unexpected weight change.  Respiratory: Negative for shortness of breath.   Cardiovascular: Negative for chest pain.  Gastrointestinal: Negative for blood in stool.  Neurological: Negative for headaches.  Psychiatric/Behavioral: Negative for dysphoric mood.       Objective:   Physical Exam  Constitutional: She is oriented to person, place, and time. She appears well-developed and well-nourished.  HENT:  Mouth/Throat: Oropharynx is clear and moist.  Neck: Neck supple. No thyromegaly present.  Cardiovascular: Normal rate, regular rhythm and normal heart sounds.  Exam reveals no gallop.   No murmur heard. Lymphadenopathy:    She has no cervical adenopathy.  Neurological: She is alert and oriented to person, place, and time. No cranial nerve deficit.  Psychiatric: She has a normal mood and affect. Her behavior is normal.          Assessment & Plan:  ADD. Agree to refill of Adderall 20 mg twice daily. Followup in 3 months to reassess weight. No hx of misuse.

## 2010-12-02 ENCOUNTER — Other Ambulatory Visit: Payer: Self-pay | Admitting: *Deleted

## 2010-12-02 MED ORDER — DIPHENOXYLATE-ATROPINE 2.5-0.025 MG PO TABS: 1.0000 | ORAL_TABLET | Freq: Four times a day (QID) | ORAL | Status: DC | PRN

## 2010-12-02 NOTE — Telephone Encounter (Signed)
Rx called in 

## 2010-12-02 NOTE — Telephone Encounter (Signed)
Refill once 

## 2010-12-02 NOTE — Telephone Encounter (Signed)
Faxed refill request for diphen/atropine tab, take 1 tab 4 times a day as needed #60 with 1 refill Last filled 11/03/10

## 2010-12-14 ENCOUNTER — Telehealth: Payer: Self-pay | Admitting: *Deleted

## 2010-12-14 MED ORDER — HYDROCODONE-ACETAMINOPHEN 5-325 MG PO TABS: 1.0000 | ORAL_TABLET | Freq: Four times a day (QID) | ORAL | Status: DC | PRN

## 2010-12-14 NOTE — Telephone Encounter (Signed)
Refill once 

## 2010-12-14 NOTE — Telephone Encounter (Signed)
Faxed refill request for hydrocodone 5-325, #20 with 0 refills Last filled 09/27/10

## 2010-12-30 ENCOUNTER — Telehealth: Payer: Self-pay | Admitting: *Deleted

## 2010-12-30 NOTE — Telephone Encounter (Signed)
Patient called c/o abdominal pain around belly button and into back.  Advised patient to make appointment.

## 2011-01-05 ENCOUNTER — Ambulatory Visit (INDEPENDENT_AMBULATORY_CARE_PROVIDER_SITE_OTHER): Payer: Managed Care, Other (non HMO) | Admitting: Women's Health

## 2011-01-05 VITALS — BP 130/80

## 2011-01-05 DIAGNOSIS — B373 Candidiasis of vulva and vagina: Secondary | ICD-10-CM

## 2011-01-05 MED ORDER — FLUCONAZOLE 150 MG PO TABS: 150.0000 mg | ORAL_TABLET | Freq: Once | ORAL | Status: AC

## 2011-01-05 NOTE — Progress Notes (Signed)
  Presents with several  complaints of abdominal pain that she states was severe in her bellybutton region 2 days ago, today states is less severe rates 4. She states she does have low abdominal pain that she does relate to IBS. She had been on Loestrin 1/20 but had stopped and states her low abdominal pain and cramping has increased since then. Same partner, states scant discharge. Denies fever or urinary symptoms.  Exam, no rebound or radiation of pain, no pain with palpation to the umbilicus. Slight discomfort greater in the left lower quadrant. External genitalia is within normal limits, speculum exam scant white discharge wet prep is positive for yeast. Bimanual no CMT no adnexal fullness or tenderness although slight discomfort in the lower left quadrant.  Abdominal pain, probable IBS and dysmenorrhea  Plan: Diflucan 150 by mouth x1 dose with a refill. Start back on the Loestrin 1/20 start first day of next cycle, condoms encouraged until permanent partner and especially first month. She was contemplating exploratory laparoscopic surgery /?endometriosis but has put on hold at this time.

## 2011-01-07 ENCOUNTER — Other Ambulatory Visit: Payer: Self-pay | Admitting: *Deleted

## 2011-01-07 MED ORDER — DIPHENOXYLATE-ATROPINE 2.5-0.025 MG PO TABS: 1.0000 | ORAL_TABLET | Freq: Four times a day (QID) | ORAL | Status: DC | PRN

## 2011-01-07 NOTE — Telephone Encounter (Signed)
Faxed refill request for Lomotil,

## 2011-01-07 NOTE — Telephone Encounter (Signed)
Last filled 09/15/10, #60 with 1 refill

## 2011-01-08 NOTE — Telephone Encounter (Signed)
Refill OK

## 2011-01-17 ENCOUNTER — Other Ambulatory Visit: Payer: Self-pay | Admitting: *Deleted

## 2011-01-17 NOTE — Telephone Encounter (Signed)
She should not be taking regularly.  Just filled last month.  May refill once but if requiring for abdominal pain this often needs follow up with GI.

## 2011-01-17 NOTE — Telephone Encounter (Signed)
Hydrocodone 5-325 refill request, last filled 12-14-10, #20 with 0 refills

## 2011-01-18 MED ORDER — HYDROCODONE-ACETAMINOPHEN 5-325 MG PO TABS: 1.0000 | ORAL_TABLET | Freq: Four times a day (QID) | ORAL | Status: DC | PRN

## 2011-01-18 NOTE — Telephone Encounter (Signed)
Pt informed on mobile VM she should not be taking med regularly.  If requiring for abd pain, she should follow-up with GI, one refill called in

## 2011-01-20 LAB — COMPREHENSIVE METABOLIC PANEL
ALT: 12
Alkaline Phosphatase: 53
CO2: 29
Glucose, Bld: 98
Potassium: 4.1
Sodium: 138
Total Bilirubin: 0.3

## 2011-01-20 LAB — URINALYSIS, ROUTINE W REFLEX MICROSCOPIC
Bilirubin Urine: NEGATIVE
Hgb urine dipstick: NEGATIVE
Ketones, ur: NEGATIVE
Protein, ur: NEGATIVE
Urobilinogen, UA: 0.2

## 2011-01-20 LAB — AMYLASE: Amylase: 80

## 2011-01-20 LAB — DIFFERENTIAL
Basophils Relative: 1
Eosinophils Absolute: 0.2
Neutrophils Relative %: 52

## 2011-01-20 LAB — CBC
Hemoglobin: 13.4
RBC: 4.49

## 2011-01-20 LAB — LIPASE, BLOOD: Lipase: 20

## 2011-02-03 ENCOUNTER — Ambulatory Visit: Payer: Managed Care, Other (non HMO) | Admitting: Family Medicine

## 2011-02-17 ENCOUNTER — Ambulatory Visit: Payer: Managed Care, Other (non HMO) | Admitting: Family Medicine

## 2011-03-08 ENCOUNTER — Telehealth: Payer: Self-pay | Admitting: *Deleted

## 2011-03-08 NOTE — Telephone Encounter (Signed)
Patient called her pharmacy and requested pain medication.  She requested this medication because she went to a new GI doctor and had a colonoscopy.  They found some inflammation and the report was faxed to our office.  She now is having some pain.  She would like [place] to call for more information.

## 2011-03-09 MED ORDER — HYDROCODONE-ACETAMINOPHEN 5-325 MG PO TABS: 1.0000 | ORAL_TABLET | Freq: Four times a day (QID) | ORAL | Status: DC | PRN

## 2011-03-09 NOTE — Telephone Encounter (Signed)
LMTCB 11/28 

## 2011-03-09 NOTE — Telephone Encounter (Signed)
GI switched her from Lomotil to Imodium. She needs to followup with GI if she's having any issues related to recent colonoscopy. may refill hydrocodone once

## 2011-03-09 NOTE — Telephone Encounter (Signed)
Rx called in, pt informed of Lomotil and Immodium

## 2011-03-09 NOTE — Telephone Encounter (Signed)
Pt is treating with [name].  She had a colonscopy on Monday, 11/26.  Colon was very inflamed, therefore bleeding with procedure, not clear if it is colitis or not at this point.  Dr Noe Gens put her on Lialda 1.2 gm tab, 2 tabs daily for inflimation.  Pt was instructed to F/U with Dr Noe Gens in 4 weeks, however Dec. Schedule is full, was told to call back in January.  Pt C/O pain and was instructed to contact PCP for pain meds.  Pt requesting Lomotil (last filled 01-07-11, #60 with 1 refill) and Hydrocodone (last filled 01/18/11 #30 with 0 refills)  [facility]

## 2011-03-09 NOTE — Telephone Encounter (Signed)
patient  Returning call.

## 2011-03-10 ENCOUNTER — Telehealth: Payer: Self-pay

## 2011-03-10 NOTE — Telephone Encounter (Signed)
[facility] Triage Call Report Triage Record Num: [id number] Operator: Hillary Bow Patient Name: Leslie [name] Call Date & Time: 03/09/2011 6:09:01PM Patient Phone: [phone] PCP: [name] Patient Gender: Female PCP Fax : [phone] Patient DOB: 04/18/1990 [facility] Name[facility] Reason for Call: Caller: Clorene/Patient; PCP: Caryl Never (Burr-shet), Elberta Fortis.; CB#: [phone]; Call Reason: RX not at Pharmacy for Abdominal Pain After Colonscopy; Sx Onset: 03/09/2011; Sx Notes: [facility] [place], [phone], RX for [place] there. Advised Pt. Protocol(s) Used: Medication Question Calls, No Triage (Adults) Recommended Outcome per Protocol: Provide Information or Advice Only Reason for Outcome: Caller has medication question only and triager answers question Care Advice: ~ 03/09/2011 6:16:46PM Page 1 of 1 [email or web address]

## 2011-03-17 ENCOUNTER — Other Ambulatory Visit: Payer: Self-pay | Admitting: Family Medicine

## 2011-03-19 ENCOUNTER — Emergency Department (HOSPITAL_COMMUNITY)
Admission: EM | Admit: 2011-03-19 | Discharge: 2011-03-19 | Disposition: A | Discharge status: Home / Self Care | Payer: Managed Care, Other (non HMO) | Attending: Emergency Medicine | Admitting: Emergency Medicine

## 2011-03-19 ENCOUNTER — Encounter (HOSPITAL_COMMUNITY): Payer: Self-pay | Admitting: *Deleted

## 2011-03-19 ENCOUNTER — Emergency Department (HOSPITAL_COMMUNITY): Payer: Managed Care, Other (non HMO)

## 2011-03-19 DIAGNOSIS — J45909 Unspecified asthma, uncomplicated: Secondary | ICD-10-CM | POA: Insufficient documentation

## 2011-03-19 DIAGNOSIS — F17200 Nicotine dependence, unspecified, uncomplicated: Secondary | ICD-10-CM | POA: Insufficient documentation

## 2011-03-19 DIAGNOSIS — F341 Dysthymic disorder: Secondary | ICD-10-CM | POA: Insufficient documentation

## 2011-03-19 DIAGNOSIS — K589 Irritable bowel syndrome without diarrhea: Secondary | ICD-10-CM | POA: Insufficient documentation

## 2011-03-19 DIAGNOSIS — R1084 Generalized abdominal pain: Secondary | ICD-10-CM | POA: Insufficient documentation

## 2011-03-19 LAB — COMPREHENSIVE METABOLIC PANEL
ALT: 10 U/L (ref 0–35)
AST: 15 U/L (ref 0–37)
Albumin: 4.4 g/dL (ref 3.5–5.2)
CO2: 24 mEq/L (ref 19–32)
Calcium: 10.3 mg/dL (ref 8.4–10.5)
GFR calc non Af Amer: >90 mL/min (ref >90)
Sodium: 138 mEq/L (ref 135–145)
Total Protein: 7.9 g/dL (ref 6.0–8.3)

## 2011-03-19 LAB — CBC
MCV: 87.8 fL (ref 78.0–100.0)
Platelets: 228 10*3/uL (ref 150–400)
RDW: 12.5 % (ref 11.5–15.5)
WBC: 8.6 10*3/uL (ref 4.0–10.5)

## 2011-03-19 LAB — URINALYSIS, ROUTINE W REFLEX MICROSCOPIC
Glucose, UA: NEGATIVE mg/dL
Hgb urine dipstick: NEGATIVE
Specific Gravity, Urine: 1.011 (ref 1.005–1.030)
pH: 7 (ref 5.0–8.0)

## 2011-03-19 LAB — DIFFERENTIAL
Basophils Absolute: 0.1 10*3/uL (ref 0.0–0.1)
Eosinophils Relative: 2 % (ref 0–5)
Lymphocytes Relative: 40 % (ref 12–46)
Neutro Abs: 4.4 10*3/uL (ref 1.7–7.7)

## 2011-03-19 LAB — POCT PREGNANCY, URINE
Preg Test, Ur: NEGATIVE
Preg Test, Ur: NEGATIVE

## 2011-03-19 MED ORDER — MORPHINE SULFATE 2 MG/ML IJ SOLN
INTRAMUSCULAR | Status: AC
  Administered 2011-03-19: 4 mg via INTRAVENOUS
  Filled 2011-03-19: qty 2

## 2011-03-19 MED ORDER — MORPHINE SULFATE 4 MG/ML IJ SOLN: 4.0000 mg | Freq: Once | INTRAMUSCULAR | Status: DC

## 2011-03-19 MED ORDER — ONDANSETRON HCL 4 MG/2ML IJ SOLN
4.0000 mg | Freq: Once | INTRAMUSCULAR | Status: AC
Start: 2011-03-19 — End: 2011-03-19
  Administered 2011-03-19: 4 mg via INTRAVENOUS
  Filled 2011-03-19: qty 2

## 2011-03-19 MED ORDER — SODIUM CHLORIDE 0.9 % IV BOLUS (SEPSIS)
1000.0000 mL | Freq: Once | INTRAVENOUS | Status: AC
  Administered 2011-03-19: 1000 mL via INTRAVENOUS

## 2011-03-19 NOTE — ED Provider Notes (Signed)
History     CSN: [id number] Arrival date & time: 03/19/2011  4:51 PM   First MD Initiated Contact with Patient 03/19/11 1951      Chief Complaint  Patient presents with  . Abdominal Pain    HPI  History provided by the patient. Patient presents with complaints of abdominal pains. Patient reports history of similar abdominal pains off and on for over the past year. She has been worked up for this by her primary care provider and recently by GI specialist in [place], Dr. Noe Gens.  Pt reports having colonscopy performed over 2 wks ago by Dr. Noe Gens with non-specific colitis and biopsies taken. Patient has not heard back on results of biopsies. Today patient states pain has been more severe and had her doubled over at times. She denies any aggravating or alleviating factors. Pain waxes and wanes and is sharp at times. She has associated nausea. Patient reports history of off and on diarrhea that is unchanged. She denies any blood or melena in stool. Patient denies any fever, chills, sweats. She denies any dysuria, hematuria, urinary frequency, vaginal discharge, vaginal bleeding or vaginal pain.   Past Medical History  Diagnosis Date  . PANIC ATTACK 12/24/2009  . ADD 04/12/2006  . ASTHMA 12/24/2009  . INSOMNIA, TRANSIENT 12/24/2009  . Anxiety   . Depression   . IBS (irritable bowel syndrome)     NEGATIVE COLONSCOPY AND ENDOSCOPY  . Smoker   . IBD (inflammatory bowel disease)     Past Surgical History  Procedure Date  . Upper gastrointestinal endoscopy 2009    Family History  Problem Relation Age of Onset  . Alcohol abuse Paternal Uncle   . Mental retardation Maternal Grandmother   . Hyperlipidemia Maternal Grandfather   . Diabetes Paternal Grandfather   . Hypertension Paternal Grandfather   . Heart disease Paternal Grandfather     History  Substance Use Topics  . Smoking status: Current Everyday Smoker -- 0.5 packs/day for 7 years    Types: Cigarettes  . Smokeless  tobacco: Current User  . Alcohol Use: No    OB History    Grav Para Term Preterm Abortions TAB SAB Ect Mult Living                  Review of Systems  Constitutional: Negative for fever and chills.  Respiratory: Negative for cough and shortness of breath.   Cardiovascular: Negative for chest pain.  Gastrointestinal: Positive for nausea, abdominal pain and diarrhea. Negative for vomiting, constipation and blood in stool.  Genitourinary: Negative for dysuria, frequency, hematuria, flank pain, vaginal bleeding, vaginal discharge, vaginal pain and pelvic pain.  All other systems reviewed and are negative.    Allergies  Review of patient's allergies indicates no known allergies.  Home Medications   Current Outpatient Rx  Name Route Sig Dispense Refill  . ALBUTEROL SULFATE HFA 108 (90 BASE) MCG/ACT IN AERS Inhalation Inhale 2 puffs into the lungs every 6 (six) hours as needed for wheezing. 1 Inhaler 2  . AMPHETAMINE-DEXTROAMPHETAMINE 20 MG PO TABS Oral Take 10 mg by mouth daily as needed. For lethargy.     . CLONAZEPAM 0.5 MG PO TABS Oral Take 1 tablet (0.5 mg total) by mouth 2 (two) times daily. For severe anxiety 60 tablet 5  . DICYCLOMINE HCL 20 MG PO TABS Oral Take 20 mg by mouth every 6 (six) hours as needed. For cramping.     Marland Kitchen HYDROCODONE-ACETAMINOPHEN 5-325 MG PO TABS Oral Take 1 tablet  by mouth every 6 (six) hours as needed for pain. 20 tablet 0  . MESALAMINE 1.2 G PO TBEC Oral Take 2,400 mg by mouth daily.      Azzie Roup ACE-ETH ESTRAD-FE 1-20 MG-MCG PO TABS Oral Take 1 tablet by mouth daily.      Marland Kitchen ONDANSETRON HCL 8 MG PO TABS Oral Take 8 mg by mouth every 8 (eight) hours as needed. For nausea.      BP 120/75  Pulse 117  Temp(Src) 98.5 F (36.9 C) (Oral)  Resp 18  SpO2 100%  Physical Exam  Nursing note and vitals reviewed. Constitutional: She is oriented to person, place, and time. She appears well-developed and well-nourished. No distress.  HENT:  Head:  Normocephalic.  Mouth/Throat: Oropharynx is clear and moist.  Cardiovascular: Normal rate, regular rhythm and normal heart sounds.   Pulmonary/Chest: Effort normal and breath sounds normal. No respiratory distress. She has no wheezes. She has no rales.  Abdominal: Soft. Normal appearance. There is no hepatosplenomegaly. There is tenderness in the right upper quadrant, epigastric area, left upper quadrant and left lower quadrant. There is no rebound, no guarding, no CVA tenderness, no tenderness at McBurney's point and negative Murphy's sign.  Neurological: She is alert and oriented to person, place, and time.  Skin: Skin is warm. No rash noted. No erythema.  Psychiatric: She has a normal mood and affect. Her behavior is normal.    ED Course  Procedures (including critical care time)  Labs Reviewed  COMPREHENSIVE METABOLIC PANEL - Abnormal; Notable for the following:    Potassium 3.3 (*)    BUN 5 (*)    Total Bilirubin 0.2 (*)    All other components within normal limits  CBC  DIFFERENTIAL  LIPASE, BLOOD  URINALYSIS, ROUTINE W REFLEX MICROSCOPIC  POCT PREGNANCY, URINE   Results for orders placed during the hospital encounter of 03/19/11  CBC      Component Value Range   WBC 8.6  4.0 - 10.5 (K/uL)   RBC 4.77  3.87 - 5.11 (MIL/uL)   Hemoglobin 14.6  12.0 - 15.0 (g/dL)   HCT 14.7  82.9 - 56.2 (%)   MCV 87.8  78.0 - 100.0 (fL)   MCH 30.6  26.0 - 34.0 (pg)   MCHC 34.8  30.0 - 36.0 (g/dL)   RDW 13.0  86.5 - 78.4 (%)   Platelets 228  150 - 400 (K/uL)  DIFFERENTIAL      Component Value Range   Neutrophils Relative 51  43 - 77 (%)   Neutro Abs 4.4  1.7 - 7.7 (K/uL)   Lymphocytes Relative 40  12 - 46 (%)   Lymphs Abs 3.4  0.7 - 4.0 (K/uL)   Monocytes Relative 6  3 - 12 (%)   Monocytes Absolute 0.6  0.1 - 1.0 (K/uL)   Eosinophils Relative 2  0 - 5 (%)   Eosinophils Absolute 0.2  0.0 - 0.7 (K/uL)   Basophils Relative 1  0 - 1 (%)   Basophils Absolute 0.1  0.0 - 0.1 (K/uL)    COMPREHENSIVE METABOLIC PANEL      Component Value Range   Sodium 138  135 - 145 (mEq/L)   Potassium 3.3 (*) 3.5 - 5.1 (mEq/L)   Chloride 102  96 - 112 (mEq/L)   CO2 24  19 - 32 (mEq/L)   Glucose, Bld 83  70 - 99 (mg/dL)   BUN 5 (*) 6 - 23 (mg/dL)   Creatinine, Ser 6.96  0.50 - 1.10 (mg/dL)   Calcium 16.1  8.4 - 10.5 (mg/dL)   Total Protein 7.9  6.0 - 8.3 (g/dL)   Albumin 4.4  3.5 - 5.2 (g/dL)   AST 15  0 - 37 (U/L)   ALT 10  0 - 35 (U/L)   Alkaline Phosphatase 46  39 - 117 (U/L)   Total Bilirubin 0.2 (*) 0.3 - 1.2 (mg/dL)   GFR calc non Af Amer >90  >90 (mL/min)   GFR calc Af Amer >90  >90 (mL/min)  LIPASE, BLOOD      Component Value Range   Lipase 25  11 - 59 (U/L)  URINALYSIS, ROUTINE W REFLEX MICROSCOPIC      Component Value Range   Color, Urine YELLOW  YELLOW    APPearance CLEAR  CLEAR    Specific Gravity, Urine 1.011  1.005 - 1.030    pH 7.0  5.0 - 8.0    Glucose, UA NEGATIVE  NEGATIVE (mg/dL)   Hgb urine dipstick NEGATIVE  NEGATIVE    Bilirubin Urine NEGATIVE  NEGATIVE    Ketones, ur NEGATIVE  NEGATIVE (mg/dL)   Protein, ur NEGATIVE  NEGATIVE (mg/dL)   Urobilinogen, UA 0.2  0.0 - 1.0 (mg/dL)   Nitrite NEGATIVE  NEGATIVE    Leukocytes, UA NEGATIVE  NEGATIVE   POCT PREGNANCY, URINE      Component Value Range   Preg Test, Ur NEGATIVE    POCT PREGNANCY, URINE      Component Value Range   Preg Test, Ur NEGATIVE       Dg Abd Acute W/chest  03/19/2011  *RADIOLOGY REPORT*  Clinical Data: Left-sided and upper abdominal pain.  Nausea. Diarrhea.  ACUTE ABDOMEN SERIES (ABDOMEN 2 VIEW & CHEST 1 VIEW) 03/19/2011:  Comparison: CT abdomen and pelvis 01/23/2007 [facility].  Findings: Bowel gas pattern unremarkable without evidence of obstruction or significant ileus.  No evidence of free intraperitoneal air or significant air fluid levels on the erect image.  No abnormal calcifications.  Visible psoas margins. Regional skeleton unremarkable.  Cardiomediastinal  silhouette unremarkable.  Lungs clear.  No pleural effusions.  IMPRESSION: No acute abdominal or pulmonary abnormality.  Original Report Authenticated By: Arnell Sieving, M.D.     1. Abdominal pain, generalized       MDM  8:00 PM patient seen and evaluated. Patient in no acute distress.  9:00 PM patient reports feeling much better after pain medications. Labs are unremarkable so far. Patient reports being on Cipro and Flagyl multiple times in the past without changes in her symptoms.  Pt reports being ready to return home.  At this time will d/c back home with continued PCP and specialist follow up for her chronic symptoms.      Angus Seller, [place] 03/20/11 [phone]

## 2011-03-19 NOTE — ED Notes (Signed)
Pt states she woke this morning with severe abd pain, nausea present, pt without noted distress in triage

## 2011-03-21 NOTE — ED Provider Notes (Signed)
Medical screening examination/treatment/procedure(s) were performed by non-physician practitioner and as supervising physician I was immediately available for consultation/collaboration.   Briza Bark M Daleiza Bacchi, DO 03/21/11 1738 

## 2011-04-20 ENCOUNTER — Telehealth: Payer: Self-pay | Admitting: *Deleted

## 2011-04-20 MED ORDER — HYDROCODONE-ACETAMINOPHEN 5-325 MG PO TABS: 1.0000 | ORAL_TABLET | Freq: Four times a day (QID) | ORAL | Status: DC | PRN

## 2011-04-20 NOTE — Telephone Encounter (Signed)
Hydrocodone 5-325 refill request.  Last filled on 03-09-11, #20 with 0 refills

## 2011-04-20 NOTE — Telephone Encounter (Signed)
May call in one refill 

## 2011-04-25 ENCOUNTER — Telehealth: Payer: Self-pay | Admitting: *Deleted

## 2011-04-25 NOTE — Telephone Encounter (Signed)
Refill on clonazepam 0.5mg 

## 2011-04-25 NOTE — Telephone Encounter (Signed)
Refill for 6 months. 

## 2011-04-26 MED ORDER — CLONAZEPAM 0.5 MG PO TABS: 0.5000 mg | ORAL_TABLET | Freq: Two times a day (BID) | ORAL | Status: DC

## 2011-04-26 NOTE — Telephone Encounter (Signed)
Rx called in 

## 2011-05-02 ENCOUNTER — Other Ambulatory Visit: Payer: Self-pay | Admitting: Family Medicine

## 2011-05-13 ENCOUNTER — Other Ambulatory Visit: Payer: Self-pay | Admitting: *Deleted

## 2011-05-13 NOTE — Telephone Encounter (Signed)
Hydrocodone 1-2 tabs every 6 hours prn pain, last filled #20 RF 0 on 04-20-11

## 2011-05-15 NOTE — Telephone Encounter (Signed)
Should not be taking regularly.  I suggest she follow up with GI if her IBS is acting up to this extent.

## 2011-05-16 NOTE — Telephone Encounter (Signed)
I tried to call pt phone seneral times, "this phone is not accepting calls or messages at this time"  We also received faxed request for same.  I wrote Dr Lucie Leather message on the fax for pharmacy to inform pt along with denial and faxed back.

## 2011-05-18 ENCOUNTER — Encounter: Payer: Self-pay | Admitting: Family Medicine

## 2011-05-18 ENCOUNTER — Ambulatory Visit (INDEPENDENT_AMBULATORY_CARE_PROVIDER_SITE_OTHER): Payer: Managed Care, Other (non HMO) | Admitting: Family Medicine

## 2011-05-18 ENCOUNTER — Ambulatory Visit: Payer: Managed Care, Other (non HMO) | Admitting: Family Medicine

## 2011-05-18 DIAGNOSIS — K529 Noninfective gastroenteritis and colitis, unspecified: Secondary | ICD-10-CM | POA: Insufficient documentation

## 2011-05-18 DIAGNOSIS — F410 Panic disorder [episodic paroxysmal anxiety] without agoraphobia: Secondary | ICD-10-CM

## 2011-05-18 DIAGNOSIS — M79609 Pain in unspecified limb: Secondary | ICD-10-CM

## 2011-05-18 DIAGNOSIS — M79673 Pain in unspecified foot: Secondary | ICD-10-CM

## 2011-05-18 DIAGNOSIS — K5289 Other specified noninfective gastroenteritis and colitis: Secondary | ICD-10-CM

## 2011-05-18 DIAGNOSIS — R110 Nausea: Secondary | ICD-10-CM

## 2011-05-18 MED ORDER — PROMETHAZINE HCL 25 MG PO TABS: 25.0000 mg | ORAL_TABLET | Freq: Three times a day (TID) | ORAL | Status: DC | PRN

## 2011-05-18 MED ORDER — CLONAZEPAM 1 MG PO TABS: 1.0000 mg | ORAL_TABLET | Freq: Two times a day (BID) | ORAL | Status: DC | PRN

## 2011-05-18 MED ORDER — CLONAZEPAM 0.5 MG PO TABS: 0.5000 mg | ORAL_TABLET | Freq: Two times a day (BID) | ORAL | Status: DC

## 2011-05-18 NOTE — Progress Notes (Signed)
  Subjective:    Patient ID: Leslie [name], female    DOB: [birth year], 21 y.o.   MRN: [id number]  HPI  Patient seen for followup medication refills and also for acute issue of right heel pain for 3 days. This occurred after doing some dancing. Her pain location is right lateral heel. No bruising. Possibly some mild swelling. Denies acute traumatic injury. No Achilles pain. No plantar fascial pain. Pain with ambulation. Icing seem to make pain worse. Pain 8/10 with ambulation  Patient has history of IBS and nonspecific colitis. Has been seen by gastroenterologist with recent colonoscopy. She was treated with Lialda but had problems with cost. That did seem to be helping with her abdominal symptoms. She has intermittent recurrent nausea. She has used both Zofran and promethazine the past and felt promethazine was more effective. Requesting refill.  Patient has a long history of anxiety. She's been on clonazepam 0.5 mg twice a day for several years. She has noted that her GI symptoms seem much improved when she was taking 1 mg dosage. She is requesting an increase from 0.5 to1 mg twice a day. No history of misuse. No alcohol use. She has some chronic intermittent diarrhea which is unchanged. No recent bloody diarrhea. Weight stable. Appetite fair. No fever or chills.   Review of Systems  Constitutional: Negative for fever, chills and unexpected weight change.  Respiratory: Negative for cough and shortness of breath.   Cardiovascular: Negative for chest pain.  Gastrointestinal: Positive for diarrhea. Negative for vomiting, constipation, blood in stool and abdominal distention.  Musculoskeletal: Negative for back pain.  Skin: Negative for rash.  Neurological: Negative for weakness.       Objective:   Physical Exam  Constitutional: She is oriented to person, place, and time. She appears well-developed and well-nourished.  HENT:  Mouth/Throat: Oropharynx is clear and moist.  Neck: Neck  supple. No thyromegaly present.  Cardiovascular: Normal rate and regular rhythm.   Pulmonary/Chest: Effort normal and breath sounds normal. No respiratory distress. She has no wheezes. She has no rales.  Abdominal: Soft. Bowel sounds are normal. She exhibits no distension and no mass. There is no tenderness. There is no rebound and no guarding.  Musculoskeletal: She exhibits no edema.       Right foot reveals no ecchymosis. No warmth. No Achilles tenderness. No plantar fascial tenderness. Minimal tenderness right lateral aspect of heel. No obvious swelling. No bony tenderness in right foot  Lymphadenopathy:    She has no cervical adenopathy.  Neurological: She is alert and oriented to person, place, and time.          Assessment & Plan:  #1 right heel pain. Suspect contusion. No evidence for Achilles injury. No evidence for retrocalcaneal bursitis. Reassurance and give this time. No significant bony injury suspected #2 history of IBS. Refill promethazine for her as needed use #3 history of chronic anxiety. Increase clonazepam 1 mg twice a day and refilled for 6 months

## 2011-05-20 ENCOUNTER — Other Ambulatory Visit: Payer: Self-pay

## 2011-05-20 NOTE — Telephone Encounter (Signed)
rx denial called into pharmacy

## 2011-05-20 NOTE — Telephone Encounter (Signed)
Fax refill request from [facility] for norco  Last seen 05/18/11  Last written 04/20/11 # 20  0RF Please advise

## 2011-05-20 NOTE — Telephone Encounter (Signed)
Denied.  She should not be taking this regularly.

## 2011-05-23 ENCOUNTER — Other Ambulatory Visit: Payer: Self-pay | Admitting: *Deleted

## 2011-05-23 NOTE — Telephone Encounter (Signed)
Pt saw GI Marney Setting MD at [facility] on 05/12/11.  They prescribed Colazal (balsalazive).  She could not afford the med until 4 days ago when she had it filled.  She broke out in a reall bad skin rash and their office told her to stop taking it and when her rash goes away call back.  Pt requested Norco and they explained they do not perscribe pain meds, he leaves that to the PCP.

## 2011-05-23 NOTE — Telephone Encounter (Signed)
Denied.  She should not be on chronic opioids for GI pain.  I will be happy to refer to pain management, but I don't think they will use opioids for chronic GI pain either.

## 2011-05-24 NOTE — Telephone Encounter (Signed)
Attempted to call, I was not able to leave a message on VM

## 2011-05-25 NOTE — Telephone Encounter (Signed)
Attempted to call again, not able to leave a message.  I called pt mother [name], emergency contact.  Message given, daughters phone has been turned off.

## 2011-05-27 ENCOUNTER — Other Ambulatory Visit: Payer: Self-pay | Admitting: Women's Health

## 2011-05-29 ENCOUNTER — Ambulatory Visit (INDEPENDENT_AMBULATORY_CARE_PROVIDER_SITE_OTHER): Payer: Managed Care, Other (non HMO) | Admitting: Family Medicine

## 2011-05-29 ENCOUNTER — Telehealth: Payer: Self-pay

## 2011-05-29 ENCOUNTER — Ambulatory Visit: Payer: Managed Care, Other (non HMO)

## 2011-05-29 VITALS — BP 126/78 | HR 106 | Temp 98.0°F | Resp 16 | Ht 64.0 in | Wt 99.0 lb

## 2011-05-29 DIAGNOSIS — M79672 Pain in left foot: Secondary | ICD-10-CM

## 2011-05-29 DIAGNOSIS — S9032XA Contusion of left foot, initial encounter: Secondary | ICD-10-CM

## 2011-05-29 DIAGNOSIS — M79609 Pain in unspecified limb: Secondary | ICD-10-CM

## 2011-05-29 DIAGNOSIS — S93629A Sprain of tarsometatarsal ligament of unspecified foot, initial encounter: Secondary | ICD-10-CM

## 2011-05-29 DIAGNOSIS — S9030XA Contusion of unspecified foot, initial encounter: Secondary | ICD-10-CM

## 2011-05-29 MED ORDER — TRAMADOL HCL 50 MG PO TABS: 50.0000 mg | ORAL_TABLET | Freq: Three times a day (TID) | ORAL | Status: AC | PRN

## 2011-05-29 NOTE — Telephone Encounter (Signed)
Pt states that she was seen today and that the rx that was given to her is on her allergy list per pharmacist

## 2011-05-29 NOTE — Progress Notes (Signed)
[facility]:  Office Visit  Chief Complaint:  Chief Complaint  Patient presents with  . Foot Pain    dropped something from the freezer on right foot x 2 days ago    HPI: Leslie [name] is a 21 y.o. female who complains of  Left foot pain x 2 weeks, started having dorsal foot pain after dancing but 2 days ago she dropped a frozen 5 lb-10 lb chicken on her foot and started having pain and swelling. Throbbing and sharp pain. She has also throbbing pain on the arch of her foot and also her achilles tendon. NO prior ankle sprains, trauma, broken foot. NO antibiotic or steroid use. NO osteoporosis.  Tried Aleve, rx strength Ibuprofen without relief.   Past Medical History  Diagnosis Date  . PANIC ATTACK 12/24/2009  . ADD 04/12/2006  . ASTHMA 12/24/2009  . INSOMNIA, TRANSIENT 12/24/2009  . Anxiety   . Depression   . IBS (irritable bowel syndrome)     NEGATIVE COLONSCOPY AND ENDOSCOPY  . Smoker   . IBD (inflammatory bowel disease)    Past Surgical History  Procedure Date  . Upper gastrointestinal endoscopy 2009   History   Social History  . Marital Status: Single    Spouse Name: N/A    Number of Children: N/A  . Years of Education: N/A   Social History Main Topics  . Smoking status: Current Everyday Smoker -- 0.5 packs/day for 7 years    Types: Cigarettes  . Smokeless tobacco: Current User  . Alcohol Use: No  . Drug Use: No  . Sexually Active: Not on file   Other Topics Concern  . Not on file   Social History Narrative  . No narrative on file   Family History  Problem Relation Age of Onset  . Alcohol abuse Paternal Uncle   . Mental retardation Maternal Grandmother   . Hyperlipidemia Maternal Grandfather   . Diabetes Paternal Grandfather   . Hypertension Paternal Grandfather   . Heart disease Paternal Grandfather    No Known Allergies Prior to Admission medications   Medication Sig Start Date End Date Taking? Authorizing Provider  albuterol  (PROVENTIL HFA;VENTOLIN HFA) 108 (90 BASE) MCG/ACT inhaler Inhale 2 puffs into the lungs every 6 (six) hours as needed for wheezing. 07/22/10 07/22/11 Yes Kristian Covey, MD  amphetamine-dextroamphetamine (ADDERALL) 20 MG tablet Take 10 mg by mouth daily as needed. For lethargy.    Yes Historical Provider, MD  clonazePAM (KLONOPIN) 1 MG tablet Take 1 tablet (1 mg total) by mouth 2 (two) times daily as needed for anxiety. 05/18/11 06/17/11 Yes Kristian Covey, MD  dicyclomine (BENTYL) 20 MG tablet TAKE 1 TABLET EVERY 6 HOURS AS NEEDED 05/02/11  Yes Kristian Covey, MD  norethindrone-ethinyl estradiol (JUNEL FE 1/20) 1-20 MG-MCG per tablet Take 1 tablet by mouth daily.     Yes Historical Provider, MD  HYDROcodone-acetaminophen (NORCO) 5-325 MG per tablet Take 1 tablet by mouth every 6 (six) hours as needed for pain. 04/20/11 04/19/12  Kristian Covey, MD     ROS: The patient denies fevers, chills, night sweats, unintentional weight loss, chest pain, palpitations, wheezing, dyspnea on exertion, nausea, vomiting, abdominal pain, dysuria, hematuria, melena, numbness, weakness, or tingling. + foot pain  All other systems have been reviewed and were otherwise negative with the exception of those mentioned in the HPI and as above.    PHYSICAL EXAM: Filed Vitals:   05/29/11 1641  BP: 126/78  Pulse: 106  Temp: 98 F (36.7 C)  Resp: 16   Filed Vitals:   05/29/11 1641  Height: 5\' 4"  (1.626 m)  Weight: 99 lb (44.906 kg)   Body mass index is 16.99 kg/(m^2).  General: Alert, no acute distress, thin white female HEENT:  Normocephalic, atraumatic, oropharynx patent. Cardiovascular:  Slight tachy rate due to ADD but regular rhythm, no rubs murmurs or gallops.  No Carotid bruits, radial pulse intact. No pedal edema.  Respiratory: Clear to auscultation bilaterally.  No wheezes, rales, or rhonchi.  No cyanosis, no use of accessory musculature GI: No organomegaly, abdomen is soft and non-tender, positive  bowel sounds.  No masses. Skin: No rashes. Neurologic: Facial musculature symmetric. Psychiatric: Patient is appropriate throughout our interaction. Lymphatic: No cervical lymphadenopathy Musculoskeletal: Gait intact. Normal   ROM, nl DTR Left foot: + dorsalis pedis, posterior tib artery. Neg anterior drawer. + pain of navicular bone. ROM intact, dorsi and plantar flexion intact   EKG/XRAY:   Primary read interpreted by Dr. Conley Rolls at [facility]. No fx or dislocation of left foot.   ASSESSMENT/PLAN: Encounter Diagnoses  Name Primary?  . Left foot pain Yes  . Contusion of left foot    I told patient that she may take Tramadol for her pain as long as she is not using it with her rx Norco. If she has breakthrough pain she may take the Tramadol. She has IBD ? Colitis and her GI doctors do not want her to take NSAIDs. Cam walker given.  ? Plantar fasciitis as well along the bottom of her foot and achilles. ADvise to do ROM exercise before getting out of bed and during day, ice prn.   F/u in 2 weeks if no improvement.    Hamilton Capri PHUONG, DO 05/29/2011 6:53 PM

## 2011-05-30 ENCOUNTER — Telehealth: Payer: Self-pay

## 2011-05-30 NOTE — Telephone Encounter (Signed)
Spoke with patient, she states she doesn't have anymore of this.  Would like refill.  I advised we typically do not rx narcotics for this, but will ask provider.  Her GI doesn't want her taking Ibuprofen, and pt states that Tylenol doesn't help.  What else can we recommend?

## 2011-05-30 NOTE — Telephone Encounter (Signed)
According to Dr. Irwin Brakeman note she has Norco that she can take so she should use this.

## 2011-05-30 NOTE — Telephone Encounter (Signed)
Patient called again to let Dr Conley Rolls know that tramadol is on her allergy list and is requesting another pain medication

## 2011-05-31 MED ORDER — HYDROCODONE-ACETAMINOPHEN 5-325 MG PO TABS: 1.0000 | ORAL_TABLET | Freq: Four times a day (QID) | ORAL | Status: AC | PRN

## 2011-05-31 NOTE — Telephone Encounter (Signed)
norco at [facility] written and signed - she should use sparingly

## 2011-05-31 NOTE — Telephone Encounter (Signed)
Left message on VM that Rx ready at front desk for p/u.

## 2011-06-14 ENCOUNTER — Other Ambulatory Visit: Payer: Self-pay | Admitting: Family Medicine

## 2011-06-14 NOTE — Telephone Encounter (Signed)
Refill each once.   

## 2011-06-14 NOTE — Telephone Encounter (Signed)
Last seen 05/18/11 med RFs and heel pain Last written  phenergan 05/02/11 #40  0RF Benty 05/18/11 #20  0RF  Please advise

## 2011-06-15 ENCOUNTER — Other Ambulatory Visit (HOSPITAL_COMMUNITY)
Admission: RE | Admit: 2011-06-15 | Discharge: 2011-06-15 | Disposition: A | Discharge status: Home / Self Care | Payer: Managed Care, Other (non HMO) | Source: Ambulatory Visit | Attending: Obstetrics and Gynecology | Admitting: Obstetrics and Gynecology

## 2011-06-15 ENCOUNTER — Encounter: Payer: Self-pay | Admitting: Women's Health

## 2011-06-15 ENCOUNTER — Ambulatory Visit (INDEPENDENT_AMBULATORY_CARE_PROVIDER_SITE_OTHER): Payer: Managed Care, Other (non HMO) | Admitting: Women's Health

## 2011-06-15 VITALS — BP 110/72 | Ht 64.25 in | Wt 98.0 lb

## 2011-06-15 DIAGNOSIS — Z01419 Encounter for gynecological examination (general) (routine) without abnormal findings: Secondary | ICD-10-CM | POA: Insufficient documentation

## 2011-06-15 DIAGNOSIS — IMO0001 Reserved for inherently not codable concepts without codable children: Secondary | ICD-10-CM

## 2011-06-15 DIAGNOSIS — Z309 Encounter for contraceptive management, unspecified: Secondary | ICD-10-CM

## 2011-06-15 MED ORDER — NORETHINDRONE ACET-ETHINYL EST 1-20 MG-MCG PO TABS: 1.0000 | ORAL_TABLET | Freq: Every day | ORAL | Status: DC

## 2011-06-15 NOTE — Patient Instructions (Signed)
Health Maintenance, 18- to 21-Year-Old SCHOOL PERFORMANCE After high school completion, the Leslie [name] adult may be attending college, technical or vocational school, or entering the military or the work force. SOCIAL AND EMOTIONAL DEVELOPMENT The Leslie [name] adult establishes adult relationships and explores sexual identity. Leslie [name] adults may be living at home or in a college dorm or apartment. Increasing independence is important with Leslie [name] adults. Throughout adolescence, teens should assume responsibility of their own health care. IMMUNIZATIONS Most Leslie [name] adults should be fully vaccinated. A booster dose of Tdap (tetanus, diphtheria, and pertussis, or "whooping cough"), a dose of meningococcal vaccine to protect against a certain type of bacterial meningitis, hepatitis A, human papillomarvirus (HPV), chickenpox, or measles vaccines may be indicated, if not given at an earlier age. Annual influenza or "flu" vaccination should be considered during flu season.  TESTING Annual screening for vision and hearing problems is recommended. Vision should be screened objectively at least once between 18 and 21 years of age. The Leslie [name] adult may be screened for anemia or tuberculosis. Leslie [name] adults should have a blood test to check for high cholesterol during this time period. Leslie [name] adults should be screened for use of alcohol and drugs. If the Leslie [name] adult is sexually active, screening for sexually transmitted infections, pregnancy, or HIV may be performed. Screening for cervical cancer should be performed within 3 years of beginning sexual activity. NUTRITION AND ORAL HEALTH  Adequate calcium intake is important. Consume 3 servings of low-fat milk and dairy products daily. For those who do not drink milk or consume dairy products, calcium enriched foods, such as juice, bread, or cereal, dark, leafy greens, or canned fish are alternate sources of calcium.   Drink plenty of water. Limit fruit juice to 8 to 12 ounces per day.  Avoid sugary beverages or sodas.   Discourage skipping meals, especially breakfast. Teens should eat a good variety of vegetables and fruits, as well as lean meats.   Avoid high fat, high salt, and high sugar foods, such as candy, chips, and cookies.   Encourage Leslie [name] adults to participate in meal planning and preparation.   Eat meals together as a family whenever possible. Encourage conversation at mealtime.   Limit fast food choices and eating out at restaurants.   Brush teeth twice a day and floss.   Schedule dental exams twice a year.  SLEEP Regular sleep habits are important. PHYSICAL, SOCIAL, AND EMOTIONAL DEVELOPMENT  One hour of regular physical activity daily is recommended. Continue to participate in sports.   Encourage Leslie [name] adults to develop their own interests and consider community service or volunteerism.   Provide guidance to the Leslie [name] adult in making decisions about college and work plans.   Make sure that Leslie [name] adults know that they should never be in a situation that makes them uncomfortable, and they should tell partners if they do not want to engage in sexual activity.   Talk to the Leslie [name] adult about body image. Eating disorders may be noted at this time. Leslie [name] adults may also be concerned about being overweight. Monitor the Leslie [name] adult for weight gain or loss.   Mood disturbances, depression, anxiety, alcoholism, or attention problems may be noted in Leslie [name] adults. Talk to the caregiver if there are concerns about mental illness.   Negotiate limit setting and independent decision making.   Encourage the Leslie [name] adult to handle conflict without physical violence.   Avoid loud noises which may impair hearing.   Limit television and computer time to 2 hours per day.   Individuals who engage in excessive sedentary activity are more likely to become overweight.  RISK BEHAVIORS  Sexually active Leslie [name] adults need to take precautions against pregnancy and sexually  transmitted infections. Talk to Leslie [name] adults about contraception.   Provide a tobacco-free and drug-free environment for the Leslie [name] adult. Talk to the Leslie [name] adult about drug, tobacco, and alcohol use among friends or at friends' homes. Make sure the Leslie [name] adult knows that smoking tobacco or marijuana and taking drugs have health consequences and may impact brain development.   Teach the Leslie [name] adult about appropriate use of over-the-counter or prescription medicines.   Establish guidelines for driving and for riding with friends.   Talk to Leslie [name] adults about the risks of drinking and driving or boating. Encourage the Leslie [name] adult to call you if he or she or friends have been drinking or using drugs.   Remind Leslie [name] adults to wear seat belts at all times in cars and life vests in boats.   Leslie [name] adults should always wear a properly fitted helmet when they are riding a bicycle.   Use caution with all-terrain vehicles (ATVs) or other motorized vehicles.   Do not keep handguns in the home. (If you do, the gun and ammunition should be locked separately and out of the Leslie [name] adult's access.)   Equip your home with smoke detectors and change the batteries regularly. Make sure all family members know the fire escape plans for your home.   Teach Leslie [name] adults not to swim alone and not to dive in shallow water.   All individuals should wear sunscreen that protects against UVA and UVB light with at least a sun protection factor (SPF) of 30 when out in the sun. This minimizes sun burning.  WHAT'S NEXT? Leslie [name] adults should visit their pediatrician or family physician yearly. By Leslie [name] adulthood, health care should be transitioned to a family physician or internal medicine specialist. Sexually active females may want to begin annual physical exams with a gynecologist. Document Released: 06/23/2006 Document Revised: 03/17/2011 Document Reviewed: 07/13/2006 ExitCare Patient Information 2012 [employer]. 

## 2011-06-15 NOTE — Telephone Encounter (Signed)
done

## 2011-06-15 NOTE — Progress Notes (Signed)
Leslie [name] 04-Nov-1990 [id number]    History:    The patient presents for annual exam.  Rare or light monthly cycle on Loestrin 1/20. Same partner. Gardasil series completed in 08. History of anxiety/depression, pelvic pain, IBS/ questionable colitis. States cyclic pelvic pain/dysmenorrhea less.  Past medical history, past surgical history, family history and social history were all reviewed and documented in the EPIC chart. Colonoscopy in 2012 showed inflammation questionable colitis.  History of a normal colonoscopy endoscopy in 08.   ROS:  A  ROS was performed and pertinent positives and negatives are included in the history.  Exam:  Filed Vitals:   06/15/11 1548  BP: 110/72    General appearance: Numerous piercings and tattoos Head/Neck:  Normal, without cervical or supraclavicular adenopathy. Thyroid:  Symmetrical, normal in size, without palpable masses or nodularity. Respiratory  Effort:  Normal  Auscultation:  Clear without wheezing or rhonchi Cardiovascular  Auscultation:  Regular rate, without rubs, murmurs or gallops  Edema/varicosities:  Not grossly evident Abdominal  Soft,nontender, without masses, guarding or rebound.  Liver/spleen:  No organomegaly noted  Hernia:  None appreciated  Skin  Inspection:  Grossly normal  Palpation:  Grossly normal Neurologic/psychiatric  Orientation:  Normal with appropriate conversation.  Mood/affect:  Normal  Genitourinary    Breasts: Examined lying and sitting.     Right: Without masses, retractions, discharge or axillary adenopathy.     Left: Without masses, retractions, discharge or axillary adenopathy.   Inguinal/mons:  Normal without inguinal adenopathy  External genitalia:  Normal  BUS/Urethra/Skene's glands:  Normal  Bladder:  Normal  Vagina:  Normal  Cervix:  Normal  Uterus:   normal in size, shape and contour.  Midline and mobile  Adnexa/parametria:     Rt: Without masses or tenderness.   Lt: Without  masses or tenderness.  Anus and perineum: Normal  Digital rectal exam:   Assessment/Plan:  21 y.o. SWF G0 for annual exam.    IBS/questionable colitis-continue care with gastroenterologist Pelvic pain/dysmenorrhea better Anxiety/depression-primary care  Plan: Loestrin 1/20 prescription, proper use, slight risk for blood clots and strokes reviewed. Smokes one quarter pack cigarettes per day, reviewed hazards of smoking for health. SBE's, exercise, calcium rich diet, MVI daily encouraged. Has had numerous labs with GI doctor as well as primary care, states all have been normal. Pap only.      Harrington Challenger WHNP, 5:10 PM 06/15/2011

## 2011-07-04 ENCOUNTER — Other Ambulatory Visit: Payer: Self-pay | Admitting: Family Medicine

## 2011-07-15 NOTE — Telephone Encounter (Signed)
Please see previous message from 2/17 for details.

## 2011-08-03 ENCOUNTER — Other Ambulatory Visit: Payer: Self-pay | Admitting: Family Medicine

## 2011-08-08 ENCOUNTER — Other Ambulatory Visit: Payer: Self-pay | Admitting: Family Medicine

## 2011-08-09 NOTE — Telephone Encounter (Signed)
Refill OK

## 2011-08-09 NOTE — Telephone Encounter (Signed)
done

## 2011-08-09 NOTE — Telephone Encounter (Signed)
Last seen 05/18/11 Last written 06/14/11 # 40  0RF Please advise

## 2011-09-21 ENCOUNTER — Ambulatory Visit: Payer: Managed Care, Other (non HMO) | Admitting: Family Medicine

## 2011-09-22 ENCOUNTER — Ambulatory Visit: Payer: Managed Care, Other (non HMO) | Admitting: Family Medicine

## 2011-09-23 ENCOUNTER — Other Ambulatory Visit: Payer: Self-pay | Admitting: Family Medicine

## 2011-09-27 ENCOUNTER — Encounter: Payer: Self-pay | Admitting: Family Medicine

## 2011-09-27 ENCOUNTER — Ambulatory Visit (INDEPENDENT_AMBULATORY_CARE_PROVIDER_SITE_OTHER): Payer: Managed Care, Other (non HMO) | Admitting: Family Medicine

## 2011-09-27 VITALS — BP 98/70 | Temp 98.1°F | Wt 105.5 lb

## 2011-09-27 DIAGNOSIS — K589 Irritable bowel syndrome without diarrhea: Secondary | ICD-10-CM | POA: Insufficient documentation

## 2011-09-27 DIAGNOSIS — F410 Panic disorder [episodic paroxysmal anxiety] without agoraphobia: Secondary | ICD-10-CM

## 2011-09-27 MED ORDER — CLONAZEPAM 1 MG PO TABS: 1.0000 mg | ORAL_TABLET | Freq: Two times a day (BID) | ORAL | Status: DC | PRN

## 2011-09-27 MED ORDER — HYDROCODONE-ACETAMINOPHEN 5-325 MG PO TABS: 1.0000 | ORAL_TABLET | Freq: Four times a day (QID) | ORAL | Status: DC | PRN

## 2011-09-27 NOTE — Progress Notes (Signed)
  Subjective:    Patient ID: Leslie [name], female    DOB: [birth year], 21 y.o.   MRN: [id number]  HPI  Patient seen for medical followup. She has history of IBS followed by gastroenterology. History of panic disorder. Previously intolerant of serotonin reuptake inhibitors. Takes clonazepam 1 mg twice a day and tolerating well. No history of misuse.  She has IBS and was recently placed on Lialda but did not stay on this because of cost issues. She is rarely taking hydrocodone for severe pain and she uses very sparingly. She's had mostly diarrhea- predominant IBS symptoms. Had extensive workup including colonoscopy recently. She had inconclusive biopsies with nonspecific colitis. Weight and appetite are stable.  Review of Systems  Constitutional: Negative for fever, chills, appetite change and unexpected weight change.  Respiratory: Negative for shortness of breath.   Cardiovascular: Negative for chest pain.  Gastrointestinal: Positive for diarrhea. Negative for nausea and vomiting.       Objective:   Physical Exam  Constitutional: She appears well-developed and well-nourished.  Neck: Neck supple. No thyromegaly present.  Cardiovascular: Normal rate and regular rhythm.   Pulmonary/Chest: Effort normal and breath sounds normal. No respiratory distress. She has no wheezes. She has no rales.  Musculoskeletal: She exhibits no edema.  Lymphadenopathy:    She has no cervical adenopathy.  Psychiatric: She has a normal mood and affect. Her behavior is normal. Judgment and thought content normal.          Assessment & Plan:  #1 history of anxiety disorder. Probable panic disorder. Refill clonazepam. Patient intolerant of serotonin reuptake inhibitors in the past #2 history of IBS. She is encouraged to followup closely with gastroenterology. Limited hydrocodone 5 mg #20 with no refill

## 2011-09-29 ENCOUNTER — Other Ambulatory Visit: Payer: Self-pay | Admitting: Family Medicine

## 2011-10-28 ENCOUNTER — Telehealth: Payer: Self-pay | Admitting: Family Medicine

## 2011-10-28 NOTE — Telephone Encounter (Signed)
Patient called stating that she would like to pick up a written refill for her klonopin, preferably today. Please advise/assist.

## 2011-10-28 NOTE — Telephone Encounter (Signed)
May refill times 3 

## 2011-10-31 ENCOUNTER — Telehealth: Payer: Self-pay | Admitting: Family Medicine

## 2011-10-31 MED ORDER — CLONAZEPAM 1 MG PO TABS: 1.0000 mg | ORAL_TABLET | Freq: Two times a day (BID) | ORAL | Status: DC | PRN

## 2011-10-31 NOTE — Telephone Encounter (Signed)
Pt called to check on status of getting to Klonopin and Hydrocodone called in to [facility] on [address]. Pt leaving on Wednesday to go out of town. Pls call in asap today or in morning tomorrow.

## 2011-10-31 NOTE — Telephone Encounter (Signed)
Pt is needs written rx [place] beach

## 2011-10-31 NOTE — Telephone Encounter (Signed)
Pt informed on her VM she should be contacting GI for abd pain concerns.

## 2011-10-31 NOTE — Telephone Encounter (Signed)
Leslie, [name]; DOB April 28, 1990; Phone number [phone]; MD: [name];  Pt is calling to have a written Rx for Klonopin and Hydrocodone; Pt called 10/28/11 and would like to have this written;  EPIC states approval for the Klonopin;  Office called and [name] instructed that it would be quicker to call in Rx than to have a written Rx; pt agreed to call in Rx to [facility] on [address]; explained to pt that Hydrocodone needs approval from MD andto check at [place] later today for Rx; will comply

## 2011-10-31 NOTE — Telephone Encounter (Signed)
Last Friday Dr Caryl Never authorized Klonopin refill for 3 months, I did call Klonopin into [facility] on [address] as requested, this was done in an earlier phone note.  Now pt also requesting Hydrocodone refill

## 2011-10-31 NOTE — Telephone Encounter (Signed)
She should not be taking the hydrocodone regularly, as previously discussed, for her IBS symptoms of abdominal pain.  She is seeing GI for this (IBS).  We had given refill of Vicodin in June.  If pain not controlled, encourage her to f/u with GI.

## 2011-10-31 NOTE — Telephone Encounter (Signed)
Rx called in, pt aware as Leslie [name] took the call and said I would call into [facility] [address] with 2 refills

## 2011-11-28 ENCOUNTER — Telehealth: Payer: Self-pay | Admitting: Family Medicine

## 2011-11-28 NOTE — Telephone Encounter (Signed)
Pt requesting early refill of controlled med (caller did not give the name of the medication).  Please call back to advise if ok to fill early.

## 2011-11-28 NOTE — Telephone Encounter (Signed)
Spoke with patient and she is having a IBD flare up.  She is having left lower abdominal pain with diarrhea and nausea.  She has tried imodium with no relief.  I offered an office visit tomorrow but she is unable to come in until the afternoon. Please advise.

## 2011-11-28 NOTE — Telephone Encounter (Signed)
Spoke with patient she will try to get in touch with GI.  Meantime an appointment made with Dr Artist Pais.

## 2011-11-28 NOTE — Telephone Encounter (Signed)
Patient sees a gastroenterologist regularly. If she is out of Bentyl, can refill .  Has she checked with GI?

## 2011-11-28 NOTE — Telephone Encounter (Signed)
Please obtain further information regarding specific med request

## 2011-11-29 ENCOUNTER — Ambulatory Visit (INDEPENDENT_AMBULATORY_CARE_PROVIDER_SITE_OTHER): Payer: Managed Care, Other (non HMO) | Admitting: Internal Medicine

## 2011-11-29 ENCOUNTER — Encounter: Payer: Self-pay | Admitting: Internal Medicine

## 2011-11-29 VITALS — BP 122/82 | Temp 98.3°F | Wt 104.0 lb

## 2011-11-29 DIAGNOSIS — K5289 Other specified noninfective gastroenteritis and colitis: Secondary | ICD-10-CM

## 2011-11-29 DIAGNOSIS — R1084 Generalized abdominal pain: Secondary | ICD-10-CM

## 2011-11-29 DIAGNOSIS — K529 Noninfective gastroenteritis and colitis, unspecified: Secondary | ICD-10-CM

## 2011-11-29 NOTE — Patient Instructions (Addendum)
Please go to the emergency room at [facility]  (Dr. Lynelle Doctor)

## 2011-11-29 NOTE — Progress Notes (Signed)
Subjective:    Patient ID: Leslie [name], female    DOB: [birth year], 21 y.o.   MRN: [id number]  HPI  21 year old white female with history of nonspecific colitis presents with one week of severe abdominal pain and diffuse diarrhea. Patient has been followed by gastroenterologist-Dr. Noe Gens at [facility]. Her last colonoscopy was in November of 2012. It showed nonspecific colitis.  Patient reports 15 bowel movements per day. 2 days ago she noticed a little blood in her stools. She has chronic mucus with her stools. She is feeling weak and lightheaded.   Review of Systems Nausea but no vomiting, she denies unusual rash or eye problems  Past Medical History  Diagnosis Date  . PANIC ATTACK 12/24/2009  . ADD 04/12/2006  . ASTHMA 12/24/2009  . INSOMNIA, TRANSIENT 12/24/2009  . Anxiety   . Depression   . IBS (irritable bowel syndrome)     NEGATIVE COLONSCOPY AND ENDOSCOPY  . Smoker   . IBD (inflammatory bowel disease)     History   Social History  . Marital Status: Single    Spouse Name: N/A    Number of Children: N/A  . Years of Education: N/A   Occupational History  . Not on file.   Social History Main Topics  . Smoking status: Current Everyday Smoker -- 0.5 packs/day for 7 years    Types: Cigarettes  . Smokeless tobacco: Current User  . Alcohol Use: No  . Drug Use: No  . Sexually Active: Yes    Birth Control/ Protection: Pill   Other Topics Concern  . Not on file   Social History Narrative  . No narrative on file    Past Surgical History  Procedure Date  . Upper gastrointestinal endoscopy 2009    Family History  Problem Relation Age of Onset  . Alcohol abuse Paternal Uncle   . Mental retardation Maternal Grandmother   . Hyperlipidemia Maternal Grandfather   . Diabetes Paternal Grandfather   . Hypertension Paternal Grandfather   . Heart disease Paternal Grandfather     Allergies  Allergen Reactions  . Tramadol Anxiety    Current Outpatient  Prescriptions on File Prior to Visit  Medication Sig Dispense Refill  . clonazePAM (KLONOPIN) 1 MG tablet Take 1 tablet (1 mg total) by mouth 2 (two) times daily as needed for anxiety.  60 tablet  2  . dicyclomine (BENTYL) 20 MG tablet TAKE 1 TABLET EVERY 6 HOURS AS NEEDED  40 tablet  0  . HYDROcodone-acetaminophen (NORCO) 5-325 MG per tablet Take 1 tablet by mouth every 6 (six) hours as needed for pain.  20 tablet  0  . norethindrone-ethinyl estradiol (JUNEL 1/20) 1-20 MG-MCG tablet Take 1 tablet by mouth daily.  21 tablet  12  . promethazine (PHENERGAN) 25 MG tablet TAKE 1 TABLET BY MOUTH EVERY 8 HOURS AS NEEDED FOR NAUSEA  20 tablet  0  . DISCONTD: albuterol (PROVENTIL HFA;VENTOLIN HFA) 108 (90 BASE) MCG/ACT inhaler Inhale 2 puffs into the lungs every 6 (six) hours as needed for wheezing.  1 Inhaler  2  . DISCONTD: clonazePAM (KLONOPIN) 1 MG tablet Take 1 tablet (1 mg total) by mouth 2 (two) times daily as needed for anxiety.  60 tablet  5    BP 122/82  Temp 98.3 F (36.8 C) (Oral)  Wt 104 lb (47.174 kg)       Objective:   Physical Exam  Constitutional: She is oriented to person, place, and time. She appears well-developed and well-nourished.  HENT:  Head: Normocephalic and atraumatic.  Eyes: Conjunctivae are normal. Pupils are equal, round, and reactive to light.  Cardiovascular: Regular rhythm.        Tachycardic  Pulmonary/Chest: Effort normal. She has no wheezes.  Abdominal: Soft. She exhibits no mass. There is tenderness. There is rebound.       Increasing bowel sounds  Neurological: She is alert and oriented to person, place, and time.  Skin: Skin is warm and dry.  Psychiatric: She has a normal mood and affect. Her behavior is normal.       Assessment & Plan:

## 2011-11-29 NOTE — Assessment & Plan Note (Addendum)
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 Patient has rebound tenderness and is tachycardic. I suggest ER evaluation. I spoke with Dr. Lynelle Doctor.  I suggest IV fluids, CBCD, Sed rate, BMET.  CT of abdomin and pelvis should be considered.  Consider start empiric cipro, flagyl.  We will arrange [facility] follow up.

## 2011-12-16 ENCOUNTER — Encounter: Payer: Self-pay | Admitting: Family Medicine

## 2011-12-16 ENCOUNTER — Telehealth: Payer: Self-pay | Admitting: Internal Medicine

## 2011-12-16 ENCOUNTER — Ambulatory Visit (INDEPENDENT_AMBULATORY_CARE_PROVIDER_SITE_OTHER): Payer: Managed Care, Other (non HMO) | Admitting: Family Medicine

## 2011-12-16 VITALS — BP 120/70 | Temp 98.0°F | Wt 103.0 lb

## 2011-12-16 DIAGNOSIS — K529 Noninfective gastroenteritis and colitis, unspecified: Secondary | ICD-10-CM

## 2011-12-16 DIAGNOSIS — K5289 Other specified noninfective gastroenteritis and colitis: Secondary | ICD-10-CM

## 2011-12-16 DIAGNOSIS — K589 Irritable bowel syndrome without diarrhea: Secondary | ICD-10-CM

## 2011-12-16 MED ORDER — HYDROCODONE-ACETAMINOPHEN 5-325 MG PO TABS: 1.0000 | ORAL_TABLET | Freq: Four times a day (QID) | ORAL | Status: DC | PRN

## 2011-12-16 MED ORDER — DIPHENOXYLATE-ATROPINE 2.5-0.025 MG PO TABS: ORAL_TABLET | ORAL | Status: DC

## 2011-12-16 NOTE — Telephone Encounter (Signed)
Dr. Juanda Chance reviewed records and declined to accept this patient.

## 2011-12-16 NOTE — Progress Notes (Signed)
  Subjective:    Patient ID: Leslie [name], female    DOB: 1991/03/26, 21 y.o.   MRN: [id number]  HPI  Patient has history of probable IBS and nonspecific coli by most recent colonoscopy 2012. She is seen today with recurrence of diffuse abdominal cramping and diarrhea. She's had several years of diarrhea predominance. Never had any constipation. Her abdominal pain is somewhat migratory but mostly lower abdomen. No fever. No bloody stools. No recent weight changes. She has previously seen gastroneurologist [facility] but recently had some difficulties getting in touch with them. She is requesting change of provider if possible to [facility].  Her gastroneurologist previously was trying to get coverage for Lialda but she could not afford. She's tried medications such as dicyclomine in the past without much relief. She's recently been taking Imodium without much relief. She has sometimes up to 10 or more watery nonbloody stools per day. She has used Lomotil in the past with better success. She is requesting refill of hydrocodone though we've been very reluctant to prescribe for chronic use in the past. She states this is the only thing that seems to provide relief of her diarrhea and abdominal pain. We've using very sparingly in the past.   Review of Systems  Constitutional: Negative for fever, appetite change and unexpected weight change.  Respiratory: Negative for cough and shortness of breath.   Gastrointestinal: Positive for abdominal pain and diarrhea. Negative for nausea, vomiting, blood in stool and abdominal distention.       Objective:   Physical Exam  Constitutional: She appears well-developed and well-nourished.  Cardiovascular: Normal rate and regular rhythm.   Pulmonary/Chest: Effort normal and breath sounds normal. No respiratory distress. She has no wheezes. She has no rales.  Abdominal: Soft. Bowel sounds are normal. She exhibits no distension and no mass. There is  tenderness. There is no rebound and no guarding.          Assessment & Plan:  History of chronic intermittent abdominal pain and diarrhea with no recent change of symptoms. Presumed IBS. Previous nonspecific colitis by colonoscopy. We gave 1 refill of Lomotil and discontinue Imodium. Patient has been referred to local gastroneurologist

## 2011-12-16 NOTE — Telephone Encounter (Signed)
Dr. Juanda Chance, I have printed records from media and placed them in your basket. Patient saw [facility] in 2008 then [facility].

## 2011-12-22 ENCOUNTER — Telehealth: Payer: Self-pay | Admitting: *Deleted

## 2011-12-22 NOTE — Telephone Encounter (Signed)
Received a message from patient wanting an appointment scheduled. Per previous phone note Dr. Juanda Chance declines to accept patient. Left a voice mail for patient to call me.

## 2011-12-23 ENCOUNTER — Telehealth: Payer: Self-pay | Admitting: Family Medicine

## 2011-12-23 NOTE — Telephone Encounter (Signed)
Told her Dr. Juanda Chance declined to accept her as a patient.

## 2011-12-23 NOTE — Telephone Encounter (Signed)
I see two referrals, one I think is canceled. Please let pt know status. Thanks.

## 2011-12-23 NOTE — Telephone Encounter (Signed)
Caller: Leslie [name]/Patient; Phone: [phone]; Reason for Call: Request to speak with Dr. Caryl Never or Memory Argue. Patient states she was referred to [facility] for stomach issues.  States she called their office and they stated she was denied as a patient.  States they did not tell her why, wants to know if she can be referred somewhere else.  Please call her back.  Thanks

## 2011-12-27 NOTE — Telephone Encounter (Signed)
lmom for pt to call  °

## 2012-01-09 ENCOUNTER — Telehealth: Payer: Self-pay | Admitting: *Deleted

## 2012-01-09 NOTE — Telephone Encounter (Signed)
We are short on options unless she is interested in referral to tertiary center such as [facility] or [facility].  I suggest she keep follow up with [facility].

## 2012-01-09 NOTE — Telephone Encounter (Signed)
Per staff message from Gardiner Ramus, scheduling coordinator, she is having trouble getting pt scheduled with GI.  Nicole sent her info to [facility] and they will not accept her.  Pt is not in the network for Dr Kinnie Scales.  Pt has been discharged from [facility].  Pt has been seen by [facility].  Please advise.

## 2012-01-10 NOTE — Telephone Encounter (Signed)
I spoke with pt yesterday, informed her of Nicole's attempts as assist in scheduling.  Pt stated she had an issue with [facility] re: a misunderstanding over a bill and she planned to contact them and try to work things out.  I did inform pt Dr Caryl Never recommended she follow-up with [facility].  Nichole, FYI.

## 2012-01-15 ENCOUNTER — Other Ambulatory Visit: Payer: Self-pay | Admitting: Family Medicine

## 2012-01-16 NOTE — Telephone Encounter (Signed)
Clonazepam, one tab bid prn last filled 10-31-11, #60 with 2 refills

## 2012-01-16 NOTE — Telephone Encounter (Signed)
Refill for 3 months. 

## 2012-01-19 ENCOUNTER — Encounter: Payer: Self-pay | Admitting: Family Medicine

## 2012-01-19 ENCOUNTER — Ambulatory Visit (INDEPENDENT_AMBULATORY_CARE_PROVIDER_SITE_OTHER): Payer: Managed Care, Other (non HMO) | Admitting: Family Medicine

## 2012-01-19 VITALS — BP 110/64 | Temp 98.4°F | Wt 106.0 lb

## 2012-01-19 DIAGNOSIS — F988 Other specified behavioral and emotional disorders with onset usually occurring in childhood and adolescence: Secondary | ICD-10-CM

## 2012-01-19 DIAGNOSIS — Z23 Encounter for immunization: Secondary | ICD-10-CM

## 2012-01-19 MED ORDER — AMPHETAMINE-DEXTROAMPHETAMINE 20 MG PO TABS: 20.0000 mg | ORAL_TABLET | Freq: Two times a day (BID) | ORAL | Status: DC

## 2012-01-19 NOTE — Progress Notes (Signed)
  Subjective:    Patient ID: Leslie [name], female    DOB: May 14, 1990, 21 y.o.   MRN: [id number]  HPI  Patient here to discuss ADD issues. Diagnosed several years ago. Previously on Adderall. She taken herself off and on in the past. She's had issues with focusing both with school and work. She is working part-time. She's never had any intolerance previously. No associated headaches. No major appetite or weight changes. She's previously used Adderall 20 mg one half to one tablet twice daily. She has history of easy distractibility and difficulty completing tasks. Symptoms of inattention occur in more than one environment. Frequently takes longer to complete projects.   Review of Systems  Constitutional: Negative for appetite change and unexpected weight change.  Neurological: Negative for headaches.       Objective:   Physical Exam  Constitutional: She appears well-developed and well-nourished.  Neck: Neck supple. No thyromegaly present.  Cardiovascular: Normal rate and regular rhythm.   No murmur heard. Pulmonary/Chest: Effort normal and breath sounds normal. No respiratory distress. She has no wheezes. She has no rales.  Musculoskeletal: She exhibits no edema.  Lymphadenopathy:    She has no cervical adenopathy.          Assessment & Plan:  ADD. Start back Adderall 20 mg twice daily. Monitor weights closely-has done well previously with no weight loss.. 1 prescription written for #60 tablets. Flu vaccine given

## 2012-01-19 NOTE — Patient Instructions (Addendum)
Attention Deficit Hyperactivity Disorder Attention deficit hyperactivity disorder (ADHD) is a problem with behavior issues based on the way the brain functions (neurobehavioral disorder). It is a common reason for behavior and academic problems in school. CAUSES  The cause of ADHD is unknown in most cases. It may run in families. It sometimes can be associated with learning disabilities and other behavioral problems. SYMPTOMS  There are 3 types of ADHD. The 3 types and some of the symptoms include:  Inattentive  Gets bored or distracted easily.  Loses or forgets things. Forgets to hand in homework.  Has trouble organizing or completing tasks.  Difficulty staying on task.  An inability to organize daily tasks and school work.  Leaving projects, chores, or homework unfinished.  Trouble paying attention or responding to details. Careless mistakes.  Difficulty following directions. Often seems like is not listening.  Dislikes activities that require sustained attention (like chores or homework).  Hyperactive-impulsive  Feels like it is impossible to sit still or stay in a seat. Fidgeting with hands and feet.  Trouble waiting turn.  Talking too much or out of turn. Interruptive.  Speaks or acts impulsively.  Aggressive, disruptive behavior.  Constantly busy or on the go, noisy.  Combined  Has symptoms of both of the above. Often children with ADHD feel discouraged about themselves and with school. They often perform well below their abilities in school. These symptoms can cause problems in home, school, and in relationships with peers. As children get older, the excess motor activities can calm down, but the problems with paying attention and staying organized persist. Most children do not outgrow ADHD but with good treatment can learn to cope with the symptoms. DIAGNOSIS  When ADHD is suspected, the diagnosis should be made by professionals trained in ADHD.  Diagnosis will  include:  Ruling out other reasons for the child's behavior.  The caregivers will check with the child's school and check their medical records.  They will talk to teachers and parents.  Behavior rating scales for the child will be filled out by those dealing with the child on a daily basis. A diagnosis is made only after all information has been considered. TREATMENT  Treatment usually includes behavioral treatment often along with medicines. It may include stimulant medicines. The stimulant medicines decrease impulsivity and hyperactivity and increase attention. Other medicines used include antidepressants and certain blood pressure medicines. Most experts agree that treatment for ADHD should address all aspects of the child's functioning. Treatment should not be limited to the use of medicines alone. Treatment should include structured classroom management. The parents must receive education to address rewarding good behavior, discipline, and limit-setting. Tutoring or behavioral therapy or both should be available for the child. If untreated, the disorder can have long-term serious effects into adolescence and adulthood. HOME CARE INSTRUCTIONS   Often with ADHD there is a lot of frustration among the family in dealing with the illness. There is often blame and anger that is not warranted. This is a life long illness. There is no way to prevent ADHD. In many cases, because the problem affects the family as a whole, the entire family may need help. A therapist can help the family find better ways to handle the disruptive behaviors and promote change. If the child is young, most of the therapist's work is with the parents. Parents will learn techniques for coping with and improving their child's behavior. Sometimes only the child with the ADHD needs counseling. Your caregivers can help   you make these decisions.  Children with ADHD may need help in organizing. Some helpful tips include:  Keep  routines the same every day from wake-up time to bedtime. Schedule everything. This includes homework and playtime. This should include outdoor and indoor recreation. Keep the schedule on the refrigerator or a bulletin board where it is frequently seen. Mark schedule changes as far in advance as possible.  Have a place for everything and keep everything in its place. This includes clothing, backpacks, and school supplies.  Encourage writing down assignments and bringing home needed books.  Offer your child a well-balanced diet. Breakfast is especially important for school performance. Children should avoid drinks with caffeine including:  Soft drinks.  Coffee.  Tea.  However, some older children (adolescents) may find these drinks helpful in improving their attention.  Children with ADHD need consistent rules that they can understand and follow. If rules are followed, give small rewards. Children with ADHD often receive, and expect, criticism. Look for good behavior and praise it. Set realistic goals. Give clear instructions. Look for activities that can foster success and self-esteem. Make time for pleasant activities with your child. Give lots of affection.  Parents are their children's greatest advocates. Learn as much as possible about ADHD. This helps you become a stronger and better advocate for your child. It also helps you educate your child's teachers and instructors if they feel inadequate in these areas. Parent support groups are often helpful. A national group with local chapters is called CHADD (Children and Adults with Attention Deficit Hyperactivity Disorder). PROGNOSIS  There is no cure for ADHD. Children with the disorder seldom outgrow it. Many find adaptive ways to accommodate the ADHD as they mature. SEEK MEDICAL CARE IF:  Your child has repeated muscle twitches, cough or speech outbursts.  Your child has sleep problems.  Your child has a marked loss of  appetite.  Your child develops depression.  Your child has new or worsening behavioral problems.  Your child develops dizziness.  Your child has a racing heart.  Your child has stomach pains.  Your child develops headaches. Document Released: 03/18/2002 Document Revised: 06/20/2011 Document Reviewed: 10/29/2007 ExitCare Patient Information 2013 [employer].  

## 2012-01-24 ENCOUNTER — Other Ambulatory Visit: Payer: Self-pay | Admitting: Family Medicine

## 2012-02-29 ENCOUNTER — Other Ambulatory Visit: Payer: Self-pay | Admitting: Family Medicine

## 2012-02-29 NOTE — Telephone Encounter (Signed)
Refill times three. 

## 2012-02-29 NOTE — Telephone Encounter (Signed)
Clonazepam last filled 10-31-14, one tab BID prn anxiety #60 with 2 refills

## 2012-03-23 ENCOUNTER — Other Ambulatory Visit: Payer: Self-pay | Admitting: Family Medicine

## 2012-03-23 NOTE — Telephone Encounter (Signed)
Pt needs refill of clonazePAM 1 mg.  Pt request a different pharm this script:: [facility]/ [address] [place]. (pt is out)

## 2012-03-23 NOTE — Telephone Encounter (Signed)
Pt informed she was given 2 refills with the 11/20 13 refill

## 2012-03-26 ENCOUNTER — Other Ambulatory Visit: Payer: Self-pay | Admitting: Women's Health

## 2012-04-23 ENCOUNTER — Other Ambulatory Visit: Payer: Self-pay | Admitting: Family Medicine

## 2012-04-24 NOTE — Telephone Encounter (Signed)
Hydrocodone last filled at [facility] 12-16-11, #20 with 0 refills.

## 2012-04-24 NOTE — Telephone Encounter (Signed)
Refill once 

## 2012-05-18 ENCOUNTER — Other Ambulatory Visit: Payer: Self-pay | Admitting: Family Medicine

## 2012-05-30 ENCOUNTER — Telehealth: Payer: Self-pay | Admitting: *Deleted

## 2012-05-30 NOTE — Telephone Encounter (Signed)
Hydrocodone refill request, last filled 04-23-12, #20 with 0 refills

## 2012-05-31 MED ORDER — HYDROCODONE-ACETAMINOPHEN 5-325 MG PO TABS: 1.0000 | ORAL_TABLET | Freq: Four times a day (QID) | ORAL | Status: DC | PRN

## 2012-05-31 NOTE — Telephone Encounter (Signed)
We have discussed previously.  Should not be taking regularly for IBS symptoms.  Refill once but she should use very sparingly.

## 2012-05-31 NOTE — Telephone Encounter (Signed)
Pt informed on VM to use sparingly

## 2012-06-12 ENCOUNTER — Telehealth: Payer: Self-pay | Admitting: *Deleted

## 2012-06-12 MED ORDER — CLONAZEPAM 1 MG PO TABS: ORAL_TABLET | ORAL | Status: DC

## 2012-06-12 NOTE — Telephone Encounter (Signed)
Refill for 3 months. 

## 2012-06-12 NOTE — Telephone Encounter (Signed)
Clonazepam one tab BID PRN last filled 05-18-12, #60 with 0 refills

## 2012-07-27 IMAGING — CR DG ABDOMEN ACUTE W/ 1V CHEST
3 series · 3 of 3 positions shown · non-contrast
Comparison: CT abdomen and pelvis 01/23/2007 [REDACTED].

CLINICAL DATA: Left-sided and upper abdominal pain.  Nausea.
Diarrhea.

ACUTE ABDOMEN SERIES (ABDOMEN 2 VIEW & CHEST 1 VIEW) 03/19/2011:

[w chest pa]
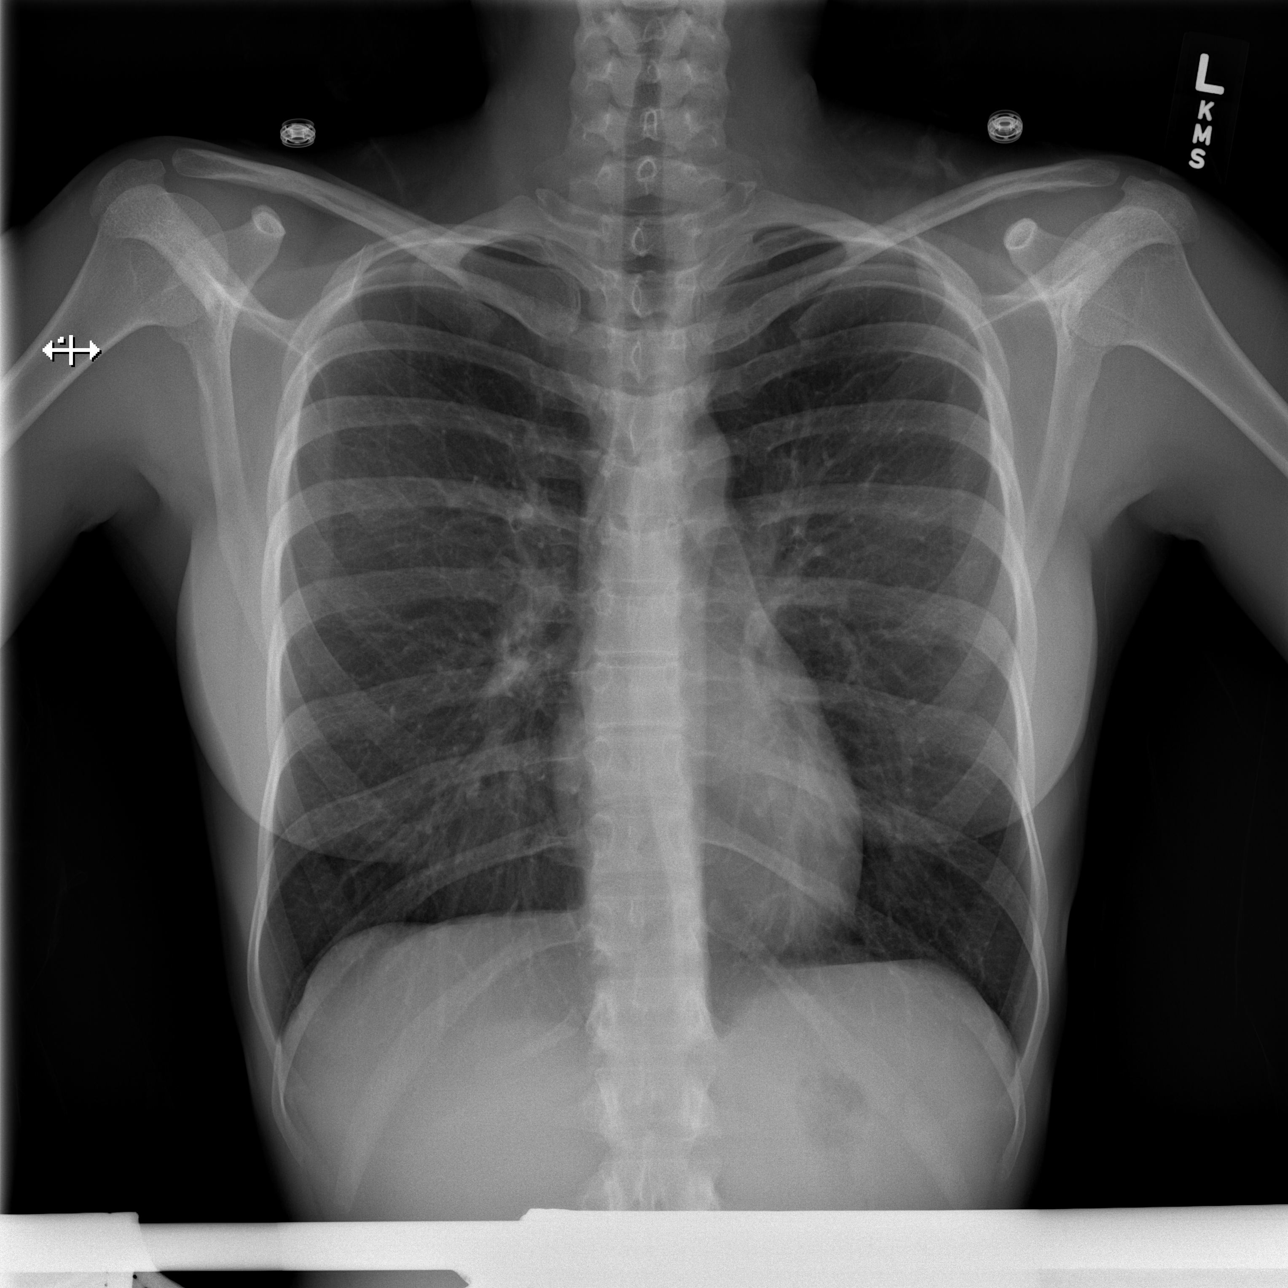

[w abdomen upright]
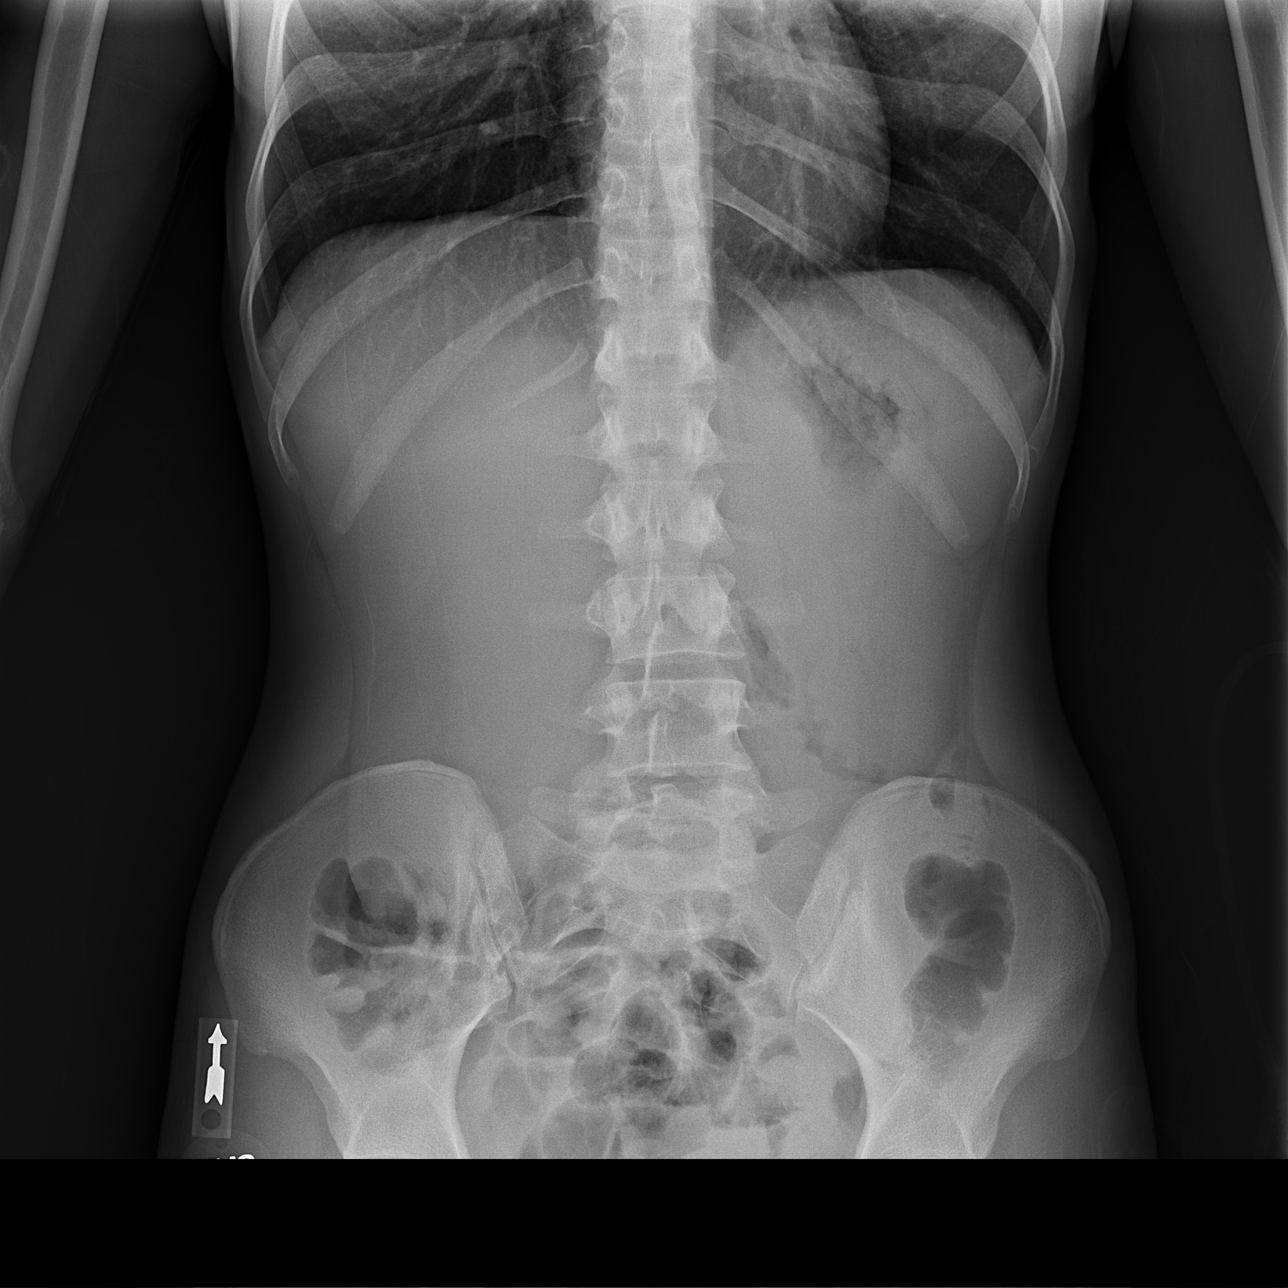

[t abdomen supine]
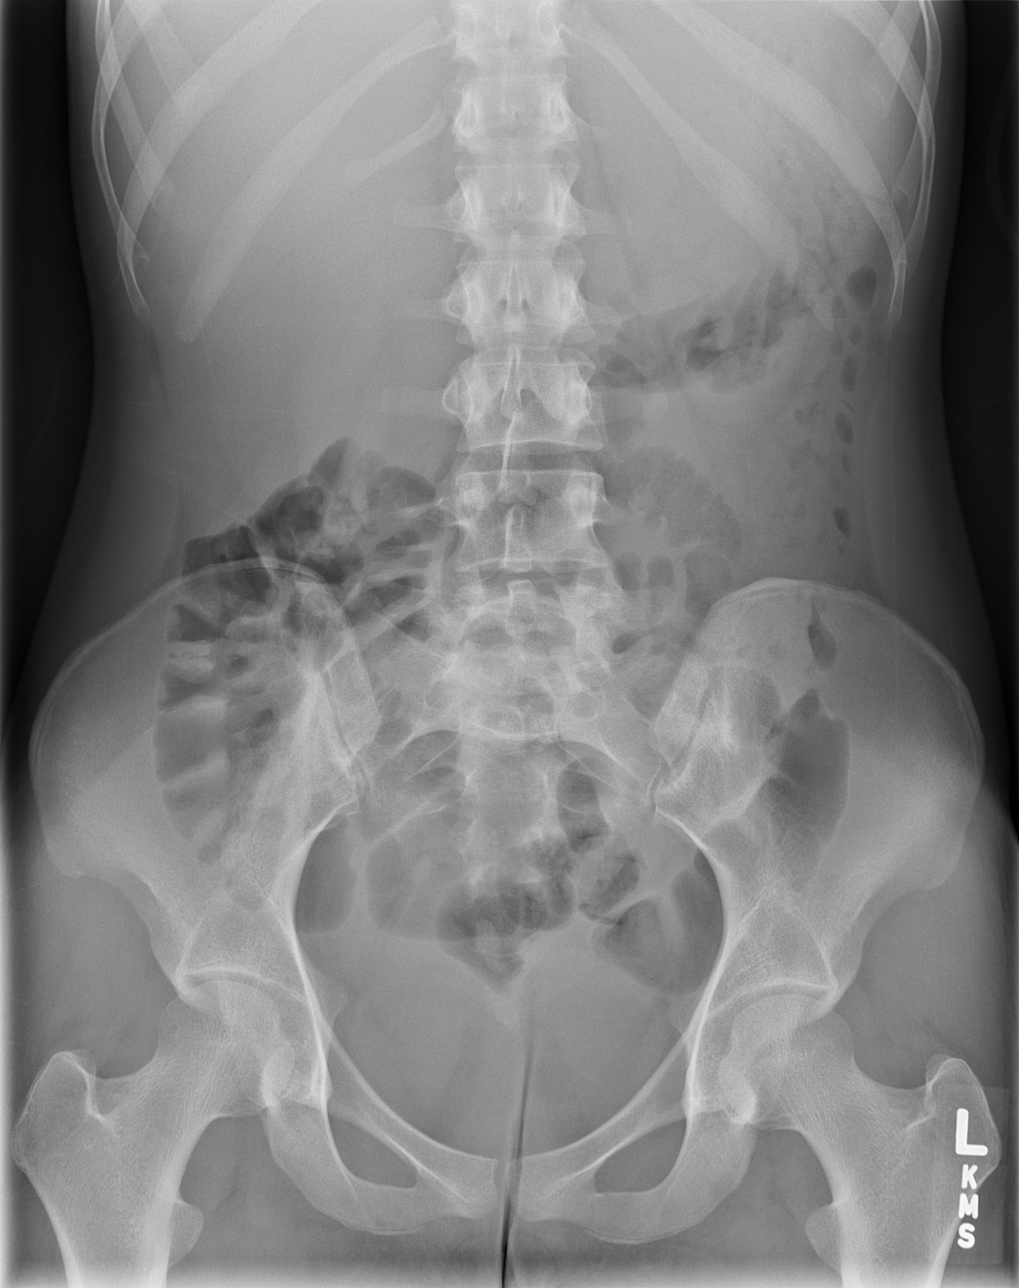

[3 of 3 positions shown; findings below may reference images not displayed]

FINDINGS: Bowel gas pattern unremarkable without evidence of
obstruction or significant ileus.  No evidence of free
intraperitoneal air or significant air fluid levels on the erect
image.  No abnormal calcifications.  Visible psoas margins.
Regional skeleton unremarkable.

Cardiomediastinal silhouette unremarkable.  Lungs clear.  No
pleural effusions.
IMPRESSION: No acute abdominal or pulmonary abnormality.

## 2012-08-27 ENCOUNTER — Other Ambulatory Visit: Payer: Self-pay | Admitting: Family Medicine

## 2012-08-27 NOTE — Telephone Encounter (Signed)
Clonazepam BID PRN  last filled 06-12-12, #60 with 2 refills Last OV 01-19-12

## 2012-08-27 NOTE — Telephone Encounter (Signed)
Refill for 3 months. 

## 2012-08-31 ENCOUNTER — Other Ambulatory Visit: Payer: Self-pay | Admitting: *Deleted

## 2012-08-31 MED ORDER — HYDROCODONE-ACETAMINOPHEN 5-325 MG PO TABS: 1.0000 | ORAL_TABLET | Freq: Four times a day (QID) | ORAL | Status: DC | PRN

## 2012-09-10 ENCOUNTER — Telehealth: Payer: Self-pay | Admitting: Family Medicine

## 2012-09-10 NOTE — Telephone Encounter (Signed)
PT is calling to request a refill of her amphetamine-dextroamphetamine (ADDERALL) 20 MG tablet. Please assist.

## 2012-09-10 NOTE — Telephone Encounter (Signed)
Refill once.  Needs office follow up for weight recheck since started this back last October.

## 2012-09-11 MED ORDER — AMPHETAMINE-DEXTROAMPHETAMINE 20 MG PO TABS: 20.0000 mg | ORAL_TABLET | Freq: Two times a day (BID) | ORAL | Status: DC

## 2012-09-11 NOTE — Addendum Note (Signed)
Addended by: Kern Reap B on: 09/11/2012 10:15 AM   Modules accepted: Orders

## 2012-09-11 NOTE — Telephone Encounter (Signed)
Left message on voicemail to call office. Pt needs office visit.

## 2012-09-11 NOTE — Telephone Encounter (Signed)
Pt notified Rx ready for pickup 

## 2012-10-23 ENCOUNTER — Telehealth: Payer: Self-pay | Admitting: Family Medicine

## 2012-10-23 NOTE — Telephone Encounter (Signed)
Pt needs new rx generic adderall 20 mg #60. Pt just want one month supply

## 2012-10-24 MED ORDER — AMPHETAMINE-DEXTROAMPHETAMINE 20 MG PO TABS: 20.0000 mg | ORAL_TABLET | Freq: Two times a day (BID) | ORAL | Status: DC

## 2012-10-24 NOTE — Telephone Encounter (Signed)
Rx at the front to picked up

## 2012-10-24 NOTE — Telephone Encounter (Signed)
Patient was last seen 01/19/12 Last refill on 09/11/12 #60 no refills

## 2012-10-24 NOTE — Telephone Encounter (Signed)
Refill OK

## 2012-11-18 ENCOUNTER — Other Ambulatory Visit: Payer: Self-pay | Admitting: Family Medicine

## 2012-11-19 NOTE — Telephone Encounter (Signed)
Refill once.  Needs office follow up. 

## 2012-11-19 NOTE — Telephone Encounter (Signed)
Last refill on 08/27/12 #60 2 refills Last visit 01/2012

## 2012-11-26 ENCOUNTER — Telehealth: Payer: Self-pay | Admitting: Family Medicine

## 2012-11-26 NOTE — Telephone Encounter (Signed)
Refill once OK. 

## 2012-11-26 NOTE — Telephone Encounter (Signed)
Patient calling about prescription for Adderall 20 mg.  No longer has insurance and told at last refill that she needs appointment before Rx can be refilled and cost of OV is $145.00 up front.  Boyfriend's parents have agreed to pay the cost of her OV but then told that charge card information can not be taken over phone but actual card will need to be swipped.  This couple area out of town at [employer] until Sun 8/24, will be able to come to office after that to take care of the bill. Patient requesting prescription for Adderall for a few days supply until Appointment can be made for next week.  Would like to pick prescription today and make her Appt for next week. Please review, contact patient at house phone [phone] for the next 30 minutes (until 3:45 pm) or on cell  [phone] after that.

## 2012-11-27 MED ORDER — AMPHETAMINE-DEXTROAMPHETAMINE 20 MG PO TABS: 20.0000 mg | ORAL_TABLET | Freq: Two times a day (BID) | ORAL | Status: DC

## 2012-11-27 NOTE — Telephone Encounter (Signed)
Left message on pt VM that rx will be at the front for pickup

## 2012-11-28 ENCOUNTER — Ambulatory Visit: Payer: Managed Care, Other (non HMO) | Admitting: Family Medicine

## 2012-12-05 ENCOUNTER — Encounter: Payer: Self-pay | Admitting: Family Medicine

## 2012-12-05 ENCOUNTER — Ambulatory Visit (INDEPENDENT_AMBULATORY_CARE_PROVIDER_SITE_OTHER): Payer: Self-pay | Admitting: Family Medicine

## 2012-12-05 VITALS — BP 102/60 | HR 88 | Temp 98.2°F | Wt 99.0 lb

## 2012-12-05 DIAGNOSIS — F988 Other specified behavioral and emotional disorders with onset usually occurring in childhood and adolescence: Secondary | ICD-10-CM

## 2012-12-05 NOTE — Patient Instructions (Addendum)
We need to get weight recheck within two months.

## 2012-12-05 NOTE — Progress Notes (Signed)
  Subjective:    Patient ID: Leslie [name], female    DOB: 01-29-1991, 22 y.o.   MRN: [id number]  HPI Followup regarding attention deficit issues. She also has long history of anxiety, nonspecific colitis, IBS, mild intermittent asthma. Currently taking Adderall for ADD. This is working well. She denies any significant appetite suppression. However, her weight is currently 99 pounds which is down 7 pounds from last visit almost 1 year ago. She denies depressive symptoms. She states she is eating regular meals. She feels good overall. No history of any eating disorder.  She does not have any tremors palpitations or other symptoms to suggest thyroid issues. She has chronic loose stools for several years and had extensive GI workup in the past  Past Medical History  Diagnosis Date  . PANIC ATTACK 12/24/2009  . ADD 04/12/2006  . ASTHMA 12/24/2009  . INSOMNIA, TRANSIENT 12/24/2009  . Anxiety   . Depression   . IBS (irritable bowel syndrome)     NEGATIVE COLONSCOPY AND ENDOSCOPY  . Smoker   . IBD (inflammatory bowel disease)    Past Surgical History  Procedure Laterality Date  . Upper gastrointestinal endoscopy  2009    reports that she has been smoking Cigarettes.  She has a 3.5 pack-year smoking history. She uses smokeless tobacco. She reports that she does not drink alcohol or use illicit drugs. family history includes Alcohol abuse in her paternal uncle; Diabetes in her paternal grandfather; Heart disease in her paternal grandfather; Hyperlipidemia in her maternal grandfather; Hypertension in her paternal grandfather; Mental retardation in her maternal grandmother. Allergies  Allergen Reactions  . Tramadol Anxiety      Review of Systems  Constitutional: Negative for appetite change and fatigue.  Eyes: Negative for visual disturbance.  Respiratory: Negative for cough, chest tightness, shortness of breath and wheezing.   Cardiovascular: Negative for chest pain, palpitations and  leg swelling.  Neurological: Negative for dizziness, seizures, syncope, weakness, light-headedness and headaches.       Objective:   Physical Exam  Constitutional: She appears well-developed and well-nourished.  Neck: Neck supple. No thyromegaly present.  Cardiovascular: Normal rate and regular rhythm.  Exam reveals no gallop.   No murmur heard. Pulmonary/Chest: Effort normal and breath sounds normal. No respiratory distress. She has no wheezes. She has no rales.  Musculoskeletal: She exhibits no edema.          Assessment & Plan:  Attention deficit disorder. We expressed our concerns her weight has gone down some. She states she has good appetite. We've recommended weight reassess in 2 months. If she is dropping for the weight at that time will need to discontinue her stimulant medication.

## 2012-12-06 ENCOUNTER — Telehealth: Payer: Self-pay | Admitting: Family Medicine

## 2012-12-06 NOTE — Telephone Encounter (Signed)
Last visit 12/05/12 Last refill 11/18/12 #60 0 refills  Pt is early she should have some left

## 2012-12-06 NOTE — Telephone Encounter (Signed)
Pt request refill of clonazePAM (KLONOPIN) 1 MG tablet  1/bid  (60 tabs)  Pt would like to p/u RX on Frday 8/28 b/c she is going out of town that day.

## 2012-12-06 NOTE — Telephone Encounter (Signed)
This was just filled on the 10th of August.  Cannot be filled early .   Due 12-19-12

## 2012-12-07 ENCOUNTER — Telehealth: Payer: Self-pay | Admitting: Family Medicine

## 2012-12-07 MED ORDER — CLONAZEPAM 1 MG PO TABS: ORAL_TABLET | ORAL | Status: DC

## 2012-12-07 NOTE — Telephone Encounter (Signed)
Pt is calling back in reference to the refill request of Klonopin.  Pt states that she is going out of on 12/10/12 and she will return on 12/16/12.  RN informed patient that she could not get the prescription refilled until 12/19/12 per Dr Caryl Never.  Pt states she was hoping to get it filled a couple of days early and she wanted to pick up the hard copy of the RX today.  RN again informed patient that RX would have the date that prescription could get filled and the pharmacy would not refill it early.  Pt still asked for message to be sent to Dr Caryl Never. Office please follow up with patient.

## 2012-12-07 NOTE — Telephone Encounter (Signed)
Pt aware that rx will be at the front for pick up. Pt is aware that medication can get filled on 12-19-12

## 2012-12-07 NOTE — Telephone Encounter (Signed)
May refill on 12-19-12 for 3 months.

## 2012-12-07 NOTE — Telephone Encounter (Signed)
Pt informed per Dr. Burchette 

## 2012-12-24 ENCOUNTER — Telehealth: Payer: Self-pay | Admitting: Family Medicine

## 2012-12-24 NOTE — Telephone Encounter (Signed)
ED Notification 

## 2012-12-24 NOTE — Telephone Encounter (Signed)
Patient Information:  Caller Name: Carrin  Phone: [phone]  Patient: Leslie, [name]  Gender: Female  DOB: 28-May-1990  Age: 22 Years  PCP: [name] [facility])  Pregnant: No  Office Follow Up:  Does the office need to follow up with this patient?: No  Instructions For The Office: N/A  RN Note:  Abdominal pain and palpable "lumps" or swelling in right upper quadrant and left lower quadrant. Pain is worse in left lower quadrant; rated 3-4/10; last night pain was rated 8-9/10.  History of IBS. Feels fatigued.  Ambulatory. Reported black diarrhea with mucus present up to 9X/day. Concerned about cost of ED; suggested [facility] for immediate evaluation.  Symptoms  Reason For Call & Symptoms: Stomach pain ranging from dull cramping or tightness restricting movement and causing nausea.  Feels chills or hot at times; no thermometer. Intermittent episodes of feeling faint "with tunnel vision"; last episode 12/24/12.  Not using Bentyl because it makes her feel "strange." No insurance currently; asking if should be seen or if can wait until new insurance effective in Jan 2015.  Reviewed Health History In EMR: Yes  Reviewed Medications In EMR: Yes  Reviewed Allergies In EMR: Yes  Reviewed Surgeries / Procedures: Yes  Date of Onset of Symptoms: 12/21/2012  Treatments Tried: > water and food intake.  Treatments Tried Worked: No OB / GYN:  LMP: 12/19/2012  Guideline(s) Used:  Abdominal Pain - Female  Disposition Per Guideline:   Go to ED Now  Reason For Disposition Reached:   Bloody, black, or tarry bowel movements  Advice Given:  N/A  Patient Will Follow Care Advice:  YES

## 2012-12-24 NOTE — Telephone Encounter (Signed)
Agree with ER or Urgent care assessment if having "black" stools.

## 2012-12-26 ENCOUNTER — Telehealth: Payer: Self-pay | Admitting: Family Medicine

## 2012-12-26 NOTE — Telephone Encounter (Signed)
PT calling to request a 1 month refill of her amphetamine-dextroamphetamine (ADDERALL) 20 MG tablet. Please assist.

## 2012-12-26 NOTE — Telephone Encounter (Signed)
Pt last seen 12/05/12.  Rx last filled 11/27/2012.  Pls advise.

## 2012-12-27 MED ORDER — AMPHETAMINE-DEXTROAMPHETAMINE 20 MG PO TABS: 20.0000 mg | ORAL_TABLET | Freq: Two times a day (BID) | ORAL | Status: DC

## 2012-12-27 NOTE — Telephone Encounter (Signed)
Called and left a message for pt; noted on envelope for pt to make 1 month f/u for weight check.

## 2012-12-27 NOTE — Telephone Encounter (Signed)
Refill once.  She will need follow up in one month for weight recheck.

## 2013-01-09 ENCOUNTER — Telehealth: Payer: Self-pay | Admitting: Family Medicine

## 2013-01-09 NOTE — Telephone Encounter (Signed)
Pt states she advised by PCP to come in montly for a weight check.  Pt has no insurance and states she can not afford to come in every month for an office visit.  Pt requesting approval to come in to get weighted only each month.  Please advise if patients actually needs to be seen or if she can come in for weight check.

## 2013-01-09 NOTE — Telephone Encounter (Signed)
Weight check is sufficient.

## 2013-01-24 ENCOUNTER — Telehealth: Payer: Self-pay | Admitting: Family Medicine

## 2013-01-24 NOTE — Telephone Encounter (Signed)
As per previous notes, we need to get weight check first.  She could come in for this without full office visit.  If she is still losing weight, we will be unable to refill her Adderall.

## 2013-01-24 NOTE — Telephone Encounter (Signed)
Pt request refill of amphetamine-dextroamphetamine (ADDERALL) 20 MG tablet 1/ BID

## 2013-01-24 NOTE — Telephone Encounter (Signed)
Last visit 12/05/12 Last refill 12/27/12 #60 0 refill

## 2013-01-25 ENCOUNTER — Encounter: Payer: Self-pay | Admitting: Family Medicine

## 2013-01-25 MED ORDER — AMPHETAMINE-DEXTROAMPHETAMINE 20 MG PO TABS: 20.0000 mg | ORAL_TABLET | Freq: Two times a day (BID) | ORAL | Status: DC

## 2013-01-25 NOTE — Telephone Encounter (Signed)
Left message on patient VM, informing the patient that she would need to come get a weight check before i can give her, her medication refill

## 2013-01-25 NOTE — Telephone Encounter (Signed)
Pt weight was 103lb

## 2013-02-07 ENCOUNTER — Telehealth: Payer: Self-pay | Admitting: Family Medicine

## 2013-02-07 NOTE — Telephone Encounter (Signed)
Pt states dr Caryl Never will fill her hydrocodone when she has stomach pain.  Pt is having a flair up and would like to know if someone could fill her rx HYDROcodone-acetaminophen (NORCO) 5-325 MG per tablet pls advise.  Pt states if she could have enough to get her through today and tomorrow.

## 2013-02-08 NOTE — Telephone Encounter (Signed)
Left patient a message on VM informing patient that Dr. Caryl Never is out of the office today and that he will be back on monday

## 2013-02-27 ENCOUNTER — Telehealth: Payer: Self-pay | Admitting: Family Medicine

## 2013-02-27 NOTE — Telephone Encounter (Signed)
Last refill 01/25/13 Last visit 12/05/12

## 2013-02-27 NOTE — Telephone Encounter (Signed)
Refill OK and get weight day of picking up rx.

## 2013-02-27 NOTE — Telephone Encounter (Signed)
Pt would like a refill of adderal 20 mg. Pt states she must get  weight ck each time we and would like to know when would be a good time to do that?

## 2013-02-28 ENCOUNTER — Encounter: Payer: Self-pay | Admitting: Family Medicine

## 2013-02-28 MED ORDER — AMPHETAMINE-DEXTROAMPHETAMINE 20 MG PO TABS: 20.0000 mg | ORAL_TABLET | Freq: Two times a day (BID) | ORAL | Status: DC

## 2013-02-28 NOTE — Telephone Encounter (Signed)
Patient weight today is 102

## 2013-03-12 ENCOUNTER — Telehealth: Payer: Self-pay

## 2013-03-12 NOTE — Telephone Encounter (Signed)
Refill for 3 months. 

## 2013-03-12 NOTE — Telephone Encounter (Signed)
Last visit 12/05/12 Last refill 12-19-12 #60 2  Clonazepam  [facility] [address]

## 2013-03-13 MED ORDER — CLONAZEPAM 1 MG PO TABS: ORAL_TABLET | ORAL | Status: DC

## 2013-03-13 NOTE — Telephone Encounter (Signed)
Called in RX for patient

## 2013-03-13 NOTE — Telephone Encounter (Signed)
Pt is calling to see if her refill on Clonazepam has been OK'd. She called her pharmacy but the refill isn't there yet. Advised Dr. Caryl Never has OK'd a refill x 3 months. Please fax this to [facility] [address]

## 2013-04-01 ENCOUNTER — Telehealth: Payer: Self-pay | Admitting: Family Medicine

## 2013-04-01 NOTE — Telephone Encounter (Signed)
Yes.  We will refill if weight not dropping.

## 2013-04-01 NOTE — Telephone Encounter (Signed)
Pt request rx amphetamine-dextroamphetamine (ADDERALL) 20 MG tablet Also, when do you want pt in for weight check?

## 2013-04-01 NOTE — Telephone Encounter (Signed)
Last visit 8/27/214 Last refill 02/28/13 #60 0 refill

## 2013-04-02 ENCOUNTER — Encounter: Payer: Self-pay | Admitting: Family Medicine

## 2013-04-02 MED ORDER — AMPHETAMINE-DEXTROAMPHETAMINE 20 MG PO TABS: 20.0000 mg | ORAL_TABLET | Freq: Two times a day (BID) | ORAL | Status: DC

## 2013-04-02 NOTE — Telephone Encounter (Signed)
Left message for patient. That she can come up to office to get her weight check before she can get her RX

## 2013-04-02 NOTE — Telephone Encounter (Signed)
Pt was given RX her weight today was 100lb. Dr. Caryl Never is aware.

## 2013-04-30 ENCOUNTER — Telehealth: Payer: Self-pay | Admitting: Family Medicine

## 2013-04-30 NOTE — Telephone Encounter (Signed)
Pt is requesting a refill of her amphetamine-dextroamphetamine (ADDERALL) 20 MG tablet.  Pt also states she normally does a weight check as well, wants to come in on Friday for that. Please advise.

## 2013-05-01 NOTE — Telephone Encounter (Signed)
OK 

## 2013-05-01 NOTE — Telephone Encounter (Signed)
Last visit 12/05/12 Last refill 04/02/13 #60 0 refill

## 2013-05-02 NOTE — Telephone Encounter (Signed)
Pt states to call her on her cell when rx ready, she previosly left an alternate number because her phone was not working.  Her cell number is [phone]

## 2013-05-02 NOTE — Telephone Encounter (Signed)
Left message on patient VM, RX will be ready but will have to a weight check first

## 2013-05-03 ENCOUNTER — Ambulatory Visit (INDEPENDENT_AMBULATORY_CARE_PROVIDER_SITE_OTHER): Payer: Self-pay | Admitting: Family Medicine

## 2013-05-03 ENCOUNTER — Encounter: Payer: Self-pay | Admitting: Family Medicine

## 2013-05-03 VITALS — BP 100/80 | HR 126 | Temp 97.8°F | Wt 98.1 lb

## 2013-05-03 DIAGNOSIS — F988 Other specified behavioral and emotional disorders with onset usually occurring in childhood and adolescence: Secondary | ICD-10-CM

## 2013-05-03 MED ORDER — AMPHETAMINE-DEXTROAMPHETAMINE 20 MG PO TABS: 20.0000 mg | ORAL_TABLET | Freq: Two times a day (BID) | ORAL | Status: DC

## 2013-05-03 NOTE — Patient Instructions (Signed)
Try app such as MyFitnessPal to help with tracking calories and energy expenditures.

## 2013-05-03 NOTE — Telephone Encounter (Signed)
Pt came into office. Pt made appt to see Dr. Caryl NeverBurchette

## 2013-05-03 NOTE — Progress Notes (Signed)
Pre visit review using our clinic review tool, if applicable. No additional management support is needed unless otherwise documented below in the visit note. 

## 2013-05-03 NOTE — Progress Notes (Signed)
   Subjective:    Patient ID: Leslie [name], female    DOB: January 27, 1991, 23 y.o.   MRN: [id number]  HPI Patient seen for followup regarding attention deficit disorder. We have had her come in for weeks each month and she has had some slight decline. Her current weight is 98 pounds. She has generally fluctuated between 98 pounds in 105 pounds for the past few years. She states she does not have any appetite suppression with Adderall. She is eating about 4-5 small meals per day. She has recently started exercising some. She has never had any history of anorexia or bulimia issues. She would like to gain a couple pounds but is fairly happy with her current weight. She is not tracking her calorie intake.  Past Medical History  Diagnosis Date  . PANIC ATTACK 12/24/2009  . ADD 04/12/2006  . ASTHMA 12/24/2009  . INSOMNIA, TRANSIENT 12/24/2009  . Anxiety   . Depression   . IBS (irritable bowel syndrome)     NEGATIVE COLONSCOPY AND ENDOSCOPY  . Smoker   . IBD (inflammatory bowel disease)    Past Surgical History  Procedure Laterality Date  . Upper gastrointestinal endoscopy  2009    reports that she has been smoking Cigarettes.  She has a 3.5 pack-year smoking history. She uses smokeless tobacco. She reports that she does not drink alcohol or use illicit drugs. family history includes Alcohol abuse in her paternal uncle; Diabetes in her paternal grandfather; Heart disease in her paternal grandfather; Hyperlipidemia in her maternal grandfather; Hypertension in her paternal grandfather; Mental retardation in her maternal grandmother. Allergies  Allergen Reactions  . Tramadol Anxiety      Review of Systems  Constitutional: Negative for chills, appetite change and unexpected weight change.  Cardiovascular: Negative for chest pain.  Gastrointestinal: Negative for nausea, vomiting, abdominal pain and diarrhea.       Objective:   Physical Exam  Constitutional: She appears well-developed and  well-nourished.  Cardiovascular: Normal rate.   Pulmonary/Chest: Effort normal and breath sounds normal. No respiratory distress. She has no wheezes. She has no rales.  Psychiatric: She has a normal mood and affect. Her behavior is normal.          Assessment & Plan:  Attention deficit disorder. Had a long talk with patient regarding concern about additional weight loss. We have strongly suggested that she track her calorie intake and energy expenditure.  We gave her some suggestions for healthy weight gain. Agreed to one refill of Adderall but if she has any further weight loss we will not do refill this again.

## 2013-05-30 ENCOUNTER — Telehealth: Payer: Self-pay | Admitting: Family Medicine

## 2013-05-30 NOTE — Telephone Encounter (Signed)
Last visit 05/03/13 Last refill 05/03/13 #60 0 refill

## 2013-05-30 NOTE — Telephone Encounter (Signed)
Pt needs new rx generic adderall 20 mg. Please call pt when rx is when.

## 2013-05-30 NOTE — Telephone Encounter (Signed)
Refill not due until the 22nd and she will have to come in for weight check.

## 2013-05-31 ENCOUNTER — Encounter: Payer: Self-pay | Admitting: Family Medicine

## 2013-05-31 MED ORDER — AMPHETAMINE-DEXTROAMPHETAMINE 20 MG PO TABS: 20.0000 mg | ORAL_TABLET | Freq: Two times a day (BID) | ORAL | Status: DC

## 2013-05-31 NOTE — Telephone Encounter (Signed)
RX printed. Pt weight 99.6

## 2013-06-02 ENCOUNTER — Other Ambulatory Visit: Payer: Self-pay | Admitting: Family Medicine

## 2013-06-03 NOTE — Telephone Encounter (Signed)
Last visit 05/03/13 Last refill 03/13/13 #60 2 refills

## 2013-06-03 NOTE — Telephone Encounter (Signed)
Refill with 2 additional refills. 

## 2013-06-28 ENCOUNTER — Telehealth: Payer: Self-pay

## 2013-06-28 NOTE — Telephone Encounter (Signed)
Pt req refill on amphetamine-dextroamphetamine (ADDERALL) 20 MG tablet

## 2013-06-28 NOTE — Telephone Encounter (Signed)
Last visit 05/03/13 Last refill 06/02/13 #60 0 refill

## 2013-06-28 NOTE — Telephone Encounter (Signed)
Refill by 07-01-13.

## 2013-07-01 ENCOUNTER — Telehealth: Payer: Self-pay | Admitting: Family Medicine

## 2013-07-01 ENCOUNTER — Other Ambulatory Visit: Payer: Self-pay

## 2013-07-01 MED ORDER — AMPHETAMINE-DEXTROAMPHETAMINE 20 MG PO TABS: 20.0000 mg | ORAL_TABLET | Freq: Two times a day (BID) | ORAL | Status: DC

## 2013-07-01 NOTE — Telephone Encounter (Signed)
Pt weight today was 98.1lb. Pt picked up RX

## 2013-07-01 NOTE — Telephone Encounter (Signed)
Left message that pt could come by the office at 12pm today to get her weight check and RX.

## 2013-07-04 ENCOUNTER — Ambulatory Visit (INDEPENDENT_AMBULATORY_CARE_PROVIDER_SITE_OTHER): Payer: Self-pay | Admitting: Family Medicine

## 2013-07-04 ENCOUNTER — Encounter: Payer: Self-pay | Admitting: Family Medicine

## 2013-07-04 VITALS — BP 98/62 | HR 100 | Temp 98.2°F | Wt 98.0 lb

## 2013-07-04 DIAGNOSIS — B349 Viral infection, unspecified: Secondary | ICD-10-CM

## 2013-07-04 DIAGNOSIS — B9789 Other viral agents as the cause of diseases classified elsewhere: Secondary | ICD-10-CM

## 2013-07-04 MED ORDER — HYDROCODONE-HOMATROPINE 5-1.5 MG/5ML PO SYRP: 5.0000 mL | ORAL_SOLUTION | Freq: Four times a day (QID) | ORAL | Status: AC | PRN

## 2013-07-04 NOTE — Patient Instructions (Signed)

## 2013-07-04 NOTE — Progress Notes (Signed)
Pre visit review using our clinic review tool, if applicable. No additional management support is needed unless otherwise documented below in the visit note. 

## 2013-07-04 NOTE — Progress Notes (Signed)
   Subjective:    Patient ID: Leslie [name], female    DOB: [birth year], 23 y.o.   MRN: [id number]  Cough Associated symptoms include chills. Pertinent negatives include no chest pain or fever.   Patient seen for acute illness. Onset about 3 days ago for cough, nasal congestion, left ear pain, body aches, and chills. She has chronic IBS and has loose stools intermittently. No nausea or vomiting. No skin rash. Only mild headache off and on. Cough has been worse at night.  Past Medical History  Diagnosis Date  . PANIC ATTACK 12/24/2009  . ADD 04/12/2006  . ASTHMA 12/24/2009  . INSOMNIA, TRANSIENT 12/24/2009  . Anxiety   . Depression   . IBS (irritable bowel syndrome)     NEGATIVE COLONSCOPY AND ENDOSCOPY  . Smoker   . IBD (inflammatory bowel disease)    Past Surgical History  Procedure Laterality Date  . Upper gastrointestinal endoscopy  2009    reports that she has been smoking Cigarettes.  She has a 3.5 pack-year smoking history. She uses smokeless tobacco. She reports that she does not drink alcohol or use illicit drugs. family history includes Alcohol abuse in her paternal uncle; Diabetes in her paternal grandfather; Heart disease in her paternal grandfather; Hyperlipidemia in her maternal grandfather; Hypertension in her paternal grandfather; Mental retardation in her maternal grandmother. Allergies  Allergen Reactions  . Tramadol Anxiety      Review of Systems  Constitutional: Positive for chills and fatigue. Negative for fever.  HENT: Positive for congestion and sinus pressure.   Respiratory: Positive for cough.   Cardiovascular: Negative for chest pain.       Objective:   Physical Exam  Constitutional: She appears well-developed and well-nourished.  HENT:  Right Ear: External ear normal.  Left Ear: External ear normal.  Mouth/Throat: Oropharynx is clear and moist.  Neck: Neck supple.  Cardiovascular: Normal rate.   Pulmonary/Chest: Effort normal and breath  sounds normal. No respiratory distress. She has no wheezes. She has no rales.  Lymphadenopathy:    She has no cervical adenopathy.          Assessment & Plan:  Probable acute viral syndrome. Nonfocal exam. Treat symptomatically. Hycodan cough syrup for nighttime severe cough as needed. Work note written for today and tomorrow

## 2013-07-05 ENCOUNTER — Telehealth: Payer: Self-pay | Admitting: Family Medicine

## 2013-07-05 NOTE — Telephone Encounter (Signed)
Relevant patient education mailed to patient.  

## 2013-07-17 NOTE — Telephone Encounter (Signed)
ERROR/GD

## 2013-07-26 ENCOUNTER — Telehealth: Payer: Self-pay | Admitting: Family Medicine

## 2013-07-26 NOTE — Telephone Encounter (Signed)
Last visit 07/04/13 Last refill 07/01/13 #60  Her weight was 98lb

## 2013-07-26 NOTE — Telephone Encounter (Signed)
Pt req rx amphetamine-dextroamphetamine (ADDERALL) 20 MG tablet  Pt wanted to know if she could skip weight check cause she is working full  time

## 2013-07-29 ENCOUNTER — Telehealth: Payer: Self-pay | Admitting: Family Medicine

## 2013-07-29 ENCOUNTER — Encounter: Payer: Self-pay | Admitting: Family Medicine

## 2013-07-29 ENCOUNTER — Ambulatory Visit (INDEPENDENT_AMBULATORY_CARE_PROVIDER_SITE_OTHER): Payer: Self-pay | Admitting: Family Medicine

## 2013-07-29 VITALS — BP 110/66 | HR 110 | Wt 100.0 lb

## 2013-07-29 DIAGNOSIS — M25569 Pain in unspecified knee: Secondary | ICD-10-CM

## 2013-07-29 DIAGNOSIS — F988 Other specified behavioral and emotional disorders with onset usually occurring in childhood and adolescence: Secondary | ICD-10-CM

## 2013-07-29 DIAGNOSIS — M25561 Pain in right knee: Secondary | ICD-10-CM

## 2013-07-29 MED ORDER — AMPHETAMINE-DEXTROAMPHETAMINE 20 MG PO TABS: 20.0000 mg | ORAL_TABLET | Freq: Two times a day (BID) | ORAL | Status: DC

## 2013-07-29 NOTE — Progress Notes (Signed)
   Subjective:    Patient ID: Leslie [name], female    DOB: 1991-03-18, 23 y.o.   MRN: [id number]  Knee Pain    Patient seen with right knee pain. This past Friday she was squatting for several minutes when she got up she felt some pain mostly on the medial aspect. She gives a background history that she's had several years of some pain behind the kneecap as well as after prolonged periods of sitting with things like riding a car.  Since last Friday she's had sensation of warmth and occasional burning behind the kneecap area. She's tried occasional Aleve and also an elastic knee sleeve. No locking or giving way.  Past Medical History  Diagnosis Date  . PANIC ATTACK 12/24/2009  . ADD 04/12/2006  . ASTHMA 12/24/2009  . INSOMNIA, TRANSIENT 12/24/2009  . Anxiety   . Depression   . IBS (irritable bowel syndrome)     NEGATIVE COLONSCOPY AND ENDOSCOPY  . Smoker   . IBD (inflammatory bowel disease)    Past Surgical History  Procedure Laterality Date  . Upper gastrointestinal endoscopy  2009    reports that she has been smoking Cigarettes.  She has a 3.5 pack-year smoking history. She uses smokeless tobacco. She reports that she does not drink alcohol or use illicit drugs. family history includes Alcohol abuse in her paternal uncle; Diabetes in her paternal grandfather; Heart disease in her paternal grandfather; Hyperlipidemia in her maternal grandfather; Hypertension in her paternal grandfather; Mental retardation in her maternal grandmother. Allergies  Allergen Reactions  . Tramadol Anxiety      Review of Systems  Constitutional: Negative for fever and chills.       Objective:   Physical Exam  Constitutional: She appears well-developed and well-nourished.  Cardiovascular: Normal rate and regular rhythm.   Musculoskeletal:  Right knee reveals no ecchymosis or erythema. No warmth. She has full range of motion. does have small effusion. Mild medial joint line tenderness. No  lateral tenderness. Collateral and cruciate ligament testing is normal.          Assessment & Plan:  Right knee pain. She has probable component of chronic patellofemoral syndrome. She has had very small effusion possibly related to meniscal injury (?medial) last week. Her symptoms are relatively mild. We recommended continued icing, elastic knee sleeve, Aleve and observe for now. Appropriate exercises given for patellofemoral syndrome. Touch base in 2-3 weeks if not improving.

## 2013-07-29 NOTE — Patient Instructions (Signed)
Patellofemoral Syndrome  If you have had pain in the front of your knee for a long time, chances are good that you have patellofemoral syndrome. The word patella refers to the kneecap. Femoral (or femur) refers to the thigh bone. That is the bone the kneecap sits on. The kneecap is shaped like a triangle. Its job is to protect the knee and to improve the efficiency of your thigh muscles (quadriceps). The underside of the kneecap is made of smooth tissue (cartilage). This lets the kneecap slide up and down as the knee moves. Sometimes this cartilage becomes soft. Your healthcare provider may say the cartilage breaks down. That is patellofemoral syndrome. It can affect one knee, or both. The condition is sometimes called patellofemoral pain syndrome. That is because the condition is painful. The pain usually gets worse with activity. Sitting for a long time with the knee bent also makes the pain worse. It usually gets better with rest and proper treatment.  CAUSES   No one is sure why some people develop this problem and others do not. Runners often get it. One name for the condition is "runner's knee." However, some people run for years and never have knee pain. Certain things seem to make patellofemoral syndrome more likely. They include:  · Moving out of alignment. The kneecap is supposed to move in a straight line when the thigh muscle pulls on it. Sometimes the kneecap moves in poor alignment. That can make the knee swell and hurt. Some experts believe it also wears down the cartilage.  · Injury to the kneecap.  · Strain on the knee. This may occur during sports activity. Soccer, running, skiing and cycling can put excess stress on the knee.  · Being flat-footed or knock-kneed.  SYMPTOMS   · Knee pain.  · Pain under the kneecap. This is usually a dull, aching pain.  · Pain in the knee when doing certain things: squatting, kneeling, going up or down stairs.  · Pain in the knee when you stand up after sitting down  for awhile.  · Tightness in the knee.  · Loss of muscle strength in the thigh.  · Swelling of the knee.  DIAGNOSIS   Healthcare providers often send people with knee pain to an orthopedic caregiver. This person has special training to treat problems with bones and joints. To decide what is causing your knee pain, your caregiver will probably:  · Do a physical exam. This will probably include:  · Asking about symptoms you have noticed.  · Asking about your activities and any injuries.  · Feeling your knee. Moving it. This will help test the knee's strength. It will also check alignment (whether the knee and leg are aligned normally).  · Order some tests, such as:  · Imaging tests. They create pictures of the inside of the knee. Tests may include:  · X-rays.  · Computed tomography (CT) scan. This uses X-rays and a computer to show more detail.  · Magnetic resonance imaging (MRI). This test uses magnets, radio waves and a computer to make pictures.  TREATMENT   · Medication is almost always used first. It can relieve pain. It also can reduce swelling. Non-steroidal anti-inflammatory medicines (called NSAIDs) are usually suggested. Sometimes a stronger form is needed. A stronger form would require a prescription.  · Other treatment may be needed after the swelling goes down. Possibilities include:  · Exercise. Certain exercises can make the muscles around the knee stronger which decreases the   pressure on the knee cap. This includes the thigh muscle. Certain exercises also may be suggested to increase your flexibility.  · A knee brace. This gives the knee extra support and helps align the movement of the knee cap.  · Orthotics. These are special shoe inserts. They can help keep your leg and knee aligned.  · Surgery is sometimes needed. This is rare. Options include:  · Arthroscopy. The surgeon uses a special tool to remove any damaged pieces of the kneecap. Only a few small incisions (cuts) are needed.  · Realignment.  This is open surgery. The goals are to reduce pressure and fix the way the kneecap moves.  HOME CARE INSTRUCTIONS   · Take any medication prescribed by your healthcare provider. Follow the directions carefully.  · If your knee is swollen:  · Put ice or cold packs on it. Do this for 20 to 30 minutes, 3 to 4 times a day.  · Keep the knee raised. Make sure it is supported. Put a pillow under it.  · Rest your knee. For example, take the elevator instead of the stairs for awhile. Or, take a break from sports activity that strain your knee. Try walking or swimming instead.  · Whenever you are active:  · Use an elastic bandage on your knee. This gives it support.  · After any activity, put ice or cold packs on your knees. Do this for about 10 to 20 minutes.  · Make sure you wear shoes that give good support. Make sure they are not worn down. The heels should not slant in or out.  SEEK MEDICAL CARE IF:   · Knee pain gets worse. Or it does not go away, even after taking pain medicine.  · Swelling does not go down.  · Your thigh muscle becomes weak.  · You have an oral temperature above 102° F (38.9° C).  SEEK IMMEDIATE MEDICAL CARE IF:   You have an oral temperature above 102° F (38.9° C), not controlled by medicine.  Document Released: 03/16/2009 Document Revised: 06/20/2011 Document Reviewed: 03/16/2009  [employer]® Patient Information ©2014 [employer].

## 2013-07-29 NOTE — Progress Notes (Signed)
Pre visit review using our clinic review tool, if applicable. No additional management support is needed unless otherwise documented below in the visit note. 

## 2013-07-29 NOTE — Telephone Encounter (Signed)
Relevant patient education mailed to patient.  

## 2013-07-30 ENCOUNTER — Telehealth: Payer: Self-pay | Admitting: Family Medicine

## 2013-07-30 NOTE — Telephone Encounter (Signed)
Pt states she is still having severe knee pain.(comes and goes) Worse at night.  Pt states she is taking aleve and it is not helping. Pt would like to have med, something stronger.  Pt would also like a note to be out of work again tomorrow and the next day. Pt states if you can write for 2 days and she is better after one, she will return to work. pls advise.

## 2013-07-31 MED ORDER — MELOXICAM 15 MG PO TABS: 15.0000 mg | ORAL_TABLET | Freq: Every day | ORAL | Status: DC

## 2013-07-31 NOTE — Telephone Encounter (Signed)
Pt does not need a note for work.

## 2013-07-31 NOTE — Telephone Encounter (Signed)
RX sent to pharmacy. Left message for patient to return call

## 2013-07-31 NOTE — Telephone Encounter (Signed)
Leave off Aleve and let's try Mobic 15 mg once daily #30 with no refills and OK to do work note.

## 2013-08-02 ENCOUNTER — Telehealth: Payer: Self-pay | Admitting: Family Medicine

## 2013-08-02 NOTE — Telephone Encounter (Signed)
Pt aware that letter is ready for pickup  

## 2013-08-02 NOTE — Telephone Encounter (Signed)
Pt has still been having knee pain and did not feel like going to work today and she will need a  note excusing her. wanted to know if Dr Caryl NeverBurchette would write her a note

## 2013-08-22 ENCOUNTER — Telehealth: Payer: Self-pay | Admitting: Family Medicine

## 2013-08-22 NOTE — Telephone Encounter (Signed)
Noted  

## 2013-08-22 NOTE — Telephone Encounter (Signed)
Patient Information:  Caller Name: Dot LanesKrista  Phone: [phone]  Patient: Leslie [name], Leslie [name]  Gender: Female  DOB: 19-Mar-1991  Age: [age] Years  PCP: Birdie SonsSwords, Bruce (Adults only)  Pregnant: No  Office Follow Up:  Does the office need to follow up with this patient?: No  Instructions For The Office: N/A  RN Note:  Dog Bite, Right Wrist and Arm numbness, onset 5-14.  Pt works at [employer], Pt was bitten by [employer] at [employer].  Pt is UTD on tetanus and Dog is UTD on Rabies shots.  All emergent sxs ruled out per Animal bite protocol, go to office now d/t puncture wound on hands.  No availablity at office or [facility], [facility], Pt doesn't wish to drive that far.  Pt will wait 24 hrs if no improvement will call back for appt.  Discussed home care per protocol.  Symptoms  Reason For Call & Symptoms: Dog Bite, Right Wrist and Arm numbness, onset 5-14.  Reviewed Health History In EMR: Yes  Reviewed Medications In EMR: Yes  Reviewed Allergies In EMR: Yes  Reviewed Surgeries / Procedures: Yes  Date of Onset of Symptoms: 08/22/2013 OB / GYN:  LMP: 08/01/2013  Guideline(s) Used:  Animal Bite  Disposition Per Guideline:   Go to Office Now  Reason For Disposition Reached:   Puncture wound or small cut on hands or genitals  Advice Given:  Bleeding:  For any bleeding, apply continuous pressure for 10 minutes.  Cleaning:  Wash all wounds immediately with soap and water for 5 minutes. Scrub the wound enough to make it re-bleed a little.  Also, flush vigorously under a faucet for a few minutes  Cleaning the wound helps prevent infection.  Antibiotic Ointment:   Apply an antibiotic ointment (e.g., Neosporin, Bacitracin) to the bite 3 times a day for 3 days.  Expected Course:  Most scratches, scrapes and other minor bites heal up fine in 3 to 5 days.  Call Back If:  Wound begins to look infected (redness, swelling, warmth, tender to touch, or red streaks)  You  become worse.  Patient Will Follow Care Advice:  YES

## 2013-08-23 ENCOUNTER — Ambulatory Visit: Payer: Self-pay

## 2013-08-23 ENCOUNTER — Other Ambulatory Visit: Payer: Self-pay | Admitting: Family Medicine

## 2013-08-23 NOTE — Telephone Encounter (Signed)
Please advise refill? 

## 2013-08-25 NOTE — Telephone Encounter (Signed)
Refill Klonopin for 6 months.  She has Mobic April 22 so should not need until 22nd of May. May refill by then   Should not be taking > one Mobic per day.

## 2013-08-26 ENCOUNTER — Telehealth: Payer: Self-pay | Admitting: Family Medicine

## 2013-08-26 NOTE — Telephone Encounter (Signed)
[facility] Triage Call Report Triage Record Num: [id number] Operator: [place] Service[employer]ana Overcash Patient Name: Leslie [name] Call Date & Time: 08/23/2013 8:01:21PM Patient Phone: [phone] PCP: Evelena PeatBruce Burchette Patient Gender: Female PCP Fax : [phone] Patient DOB: April 10, 1991 [facility] Reason for Call: Caller: [name]/Patient; PCP: Evelena PeatBurchette, Bruce (Family Practice); CB#: [phone]; Call regarding Injury/Trauma, pt was bitten on right wrist and right thumb by a dog on 08/22/13, now has numbness and tingling in right arm (08/23/13); pt denies any punctures, pt states she has pain from the dog biting down on her wrist and hand; Afebrile; area is bruised, but denies any signs of infection; Pt states that she is up to date on tetanus; Disposition of See ED Immediately d/t "New onset numbness/tingling, weakness/paralysis, or inability to purposely move at or below the site of injury" per Bites: Animal or Human Protocol; Home care advice given, pt states that she may go to ED. Protocol(s) Used: Bites - Animal or Human Recommended Outcome per Protocol: See ED Immediately Reason for Outcome: New onset numbness/tingling, weakness/paralysis, or inability to purposely move at or below the site of injury Care Advice: ~ Another adult should drive. 05/

## 2013-08-26 NOTE — Telephone Encounter (Signed)
Pt needs new rx generic adderall 20 mg. Pt will pick up on wednesday

## 2013-08-27 ENCOUNTER — Other Ambulatory Visit: Payer: Self-pay

## 2013-08-27 MED ORDER — CLONAZEPAM 1 MG PO TABS: ORAL_TABLET | ORAL | Status: DC

## 2013-08-27 NOTE — Telephone Encounter (Signed)
Reprinted clonazepam because the pharmacy states that they never received the fax of the rx

## 2013-08-27 NOTE — Telephone Encounter (Signed)
Last visit 07/29/13 Last refill 07/29/13 #60 0 refill

## 2013-08-27 NOTE — Telephone Encounter (Signed)
OK to refill but try to get her weight.

## 2013-08-28 MED ORDER — AMPHETAMINE-DEXTROAMPHETAMINE 20 MG PO TABS: 20.0000 mg | ORAL_TABLET | Freq: Two times a day (BID) | ORAL | Status: DC

## 2013-08-28 NOTE — Telephone Encounter (Signed)
Pt informed that she will have to get weight check first before getting prescription.

## 2013-08-28 NOTE — Telephone Encounter (Signed)
Pt came into office weight today 101.9lb patient able to get RX.

## 2013-08-30 ENCOUNTER — Emergency Department (HOSPITAL_COMMUNITY): Payer: Worker's Compensation

## 2013-08-30 ENCOUNTER — Encounter (HOSPITAL_COMMUNITY): Payer: Self-pay | Admitting: Emergency Medicine

## 2013-08-30 ENCOUNTER — Emergency Department (HOSPITAL_COMMUNITY)
Admission: EM | Admit: 2013-08-30 | Discharge: 2013-08-30 | Disposition: A | Discharge status: Home / Self Care | Payer: Worker's Compensation | Attending: Emergency Medicine | Admitting: Emergency Medicine

## 2013-08-30 DIAGNOSIS — T148XXA Other injury of unspecified body region, initial encounter: Secondary | ICD-10-CM

## 2013-08-30 DIAGNOSIS — Y990 Civilian activity done for income or pay: Secondary | ICD-10-CM | POA: Diagnosis not present

## 2013-08-30 DIAGNOSIS — Z791 Long term (current) use of non-steroidal anti-inflammatories (NSAID): Secondary | ICD-10-CM | POA: Diagnosis not present

## 2013-08-30 DIAGNOSIS — Z79899 Other long term (current) drug therapy: Secondary | ICD-10-CM | POA: Diagnosis not present

## 2013-08-30 DIAGNOSIS — W540XXA Bitten by dog, initial encounter: Secondary | ICD-10-CM | POA: Diagnosis not present

## 2013-08-30 DIAGNOSIS — S61409A Unspecified open wound of unspecified hand, initial encounter: Secondary | ICD-10-CM | POA: Diagnosis not present

## 2013-08-30 DIAGNOSIS — J45909 Unspecified asthma, uncomplicated: Secondary | ICD-10-CM | POA: Insufficient documentation

## 2013-08-30 DIAGNOSIS — S61509A Unspecified open wound of unspecified wrist, initial encounter: Secondary | ICD-10-CM | POA: Diagnosis present

## 2013-08-30 DIAGNOSIS — IMO0002 Reserved for concepts with insufficient information to code with codable children: Secondary | ICD-10-CM | POA: Insufficient documentation

## 2013-08-30 DIAGNOSIS — F988 Other specified behavioral and emotional disorders with onset usually occurring in childhood and adolescence: Secondary | ICD-10-CM | POA: Diagnosis not present

## 2013-08-30 DIAGNOSIS — F410 Panic disorder [episodic paroxysmal anxiety] without agoraphobia: Secondary | ICD-10-CM | POA: Diagnosis not present

## 2013-08-30 DIAGNOSIS — Y939 Activity, unspecified: Secondary | ICD-10-CM | POA: Diagnosis not present

## 2013-08-30 DIAGNOSIS — F411 Generalized anxiety disorder: Secondary | ICD-10-CM | POA: Diagnosis not present

## 2013-08-30 DIAGNOSIS — Z8719 Personal history of other diseases of the digestive system: Secondary | ICD-10-CM | POA: Insufficient documentation

## 2013-08-30 DIAGNOSIS — F17200 Nicotine dependence, unspecified, uncomplicated: Secondary | ICD-10-CM | POA: Diagnosis not present

## 2013-08-30 DIAGNOSIS — Y929 Unspecified place or not applicable: Secondary | ICD-10-CM | POA: Diagnosis not present

## 2013-08-30 DIAGNOSIS — F3289 Other specified depressive episodes: Secondary | ICD-10-CM | POA: Diagnosis not present

## 2013-08-30 DIAGNOSIS — F329 Major depressive disorder, single episode, unspecified: Secondary | ICD-10-CM | POA: Insufficient documentation

## 2013-08-30 DIAGNOSIS — M25539 Pain in unspecified wrist: Secondary | ICD-10-CM

## 2013-08-30 MED ORDER — AMOXICILLIN-POT CLAVULANATE 875-125 MG PO TABS: 1.0000 | ORAL_TABLET | Freq: Two times a day (BID) | ORAL | Status: DC

## 2013-08-30 MED ORDER — HYDROCODONE-ACETAMINOPHEN 5-325 MG PO TABS: 1.0000 | ORAL_TABLET | Freq: Four times a day (QID) | ORAL | Status: DC | PRN

## 2013-08-30 NOTE — Discharge Instructions (Signed)

## 2013-08-30 NOTE — ED Provider Notes (Signed)
Medical screening examination/treatment/procedure(s) were performed by non-physician practitioner and as supervising physician I was immediately available for consultation/collaboration.   Ediberto Sens T Elazar Argabright, MD 08/30/13 2237 

## 2013-08-30 NOTE — ED Provider Notes (Signed)
CSN: [id number]     Arrival date & time 08/30/13  1731 History  This chart was scribed for non-physician practitioner, Teressa LowerVrinda Marquarius Lofton, NP-C working with Toy BakerAnthony T Allen, MD by Luisa DagoPriscilla Tutu, ED scribe. This patient was seen in room WTR6/WTR6 and the patient's care was started at 6:06 PM.    Chief Complaint  Patient presents with  . Wrist Pain  . Animal Bite    The history is provided by the patient. No language interpreter was used.   HPI Comments: Leslie [name] is a 23 y.o. female who presents to the Emergency Department complaining of worsening right wrist pain that started approximately 1 week ago. Pt states that she was bit by a dog one week ago. She said that she did not notice the wrist pain after the accident because she was in shock. However, she states that the next day she started experiencing pain, paraesthesia, and swelling in her right wrist. She states that sometimes when she moves her wrist she can feel it in her right elbow. Pt states that her tetanus vaccine is UTD, and that the dog had been vaccinated. She reports wrapping her right wrist with an ACE bandage with minimal relief. She denies any other pertinent medical symptoms.   Past Medical History  Diagnosis Date  . PANIC ATTACK 12/24/2009  . ADD 04/12/2006  . ASTHMA 12/24/2009  . INSOMNIA, TRANSIENT 12/24/2009  . Anxiety   . Depression   . IBS (irritable bowel syndrome)     NEGATIVE COLONSCOPY AND ENDOSCOPY  . Smoker   . IBD (inflammatory bowel disease)    Past Surgical History  Procedure Laterality Date  . Upper gastrointestinal endoscopy  2009   Family History  Problem Relation Age of Onset  . Alcohol abuse Paternal Uncle   . Mental retardation Maternal Grandmother   . Hyperlipidemia Maternal Grandfather   . Diabetes Paternal Grandfather   . Hypertension Paternal Grandfather   . Heart disease Paternal Grandfather    History  Substance Use Topics  . Smoking status: Current Every Day Smoker -- 0.50  packs/day for 7 years    Types: Cigarettes  . Smokeless tobacco: Current User  . Alcohol Use: No   OB History   Grav Para Term Preterm Abortions TAB SAB Ect Mult Living   0              Review of Systems  Constitutional: Negative for fever, chills and diaphoresis.  HENT: Negative for congestion and sore throat.   Respiratory: Negative for cough and shortness of breath.   Cardiovascular: Negative for chest pain.  Gastrointestinal: Negative for nausea, vomiting, abdominal pain, diarrhea and constipation.  Musculoskeletal: Positive for arthralgias (right wrist) and joint swelling (mild; right wrist).  Skin: Positive for wound (dog bite to right hand).  Neurological: Negative for dizziness, syncope and headaches.      Allergies  Tramadol  Home Medications   Prior to Admission medications   Medication Sig Start Date End Date Taking? Authorizing Provider  albuterol (PROVENTIL HFA;VENTOLIN HFA) 108 (90 BASE) MCG/ACT inhaler Inhale 2 puffs into the lungs every 6 (six) hours as needed. 07/22/10 07/29/13  Kristian CoveyBruce W Burchette, MD  amphetamine-dextroamphetamine (ADDERALL) 20 MG tablet Take 1 tablet (20 mg total) by mouth 2 (two) times daily. 08/28/13   Kristian CoveyBruce W Burchette, MD  clonazePAM (KLONOPIN) 1 MG tablet TAKE 1 TABLET BY MOUTH TWICE DAILY AS NEEDED 08/27/13   Kristian CoveyBruce W Burchette, MD  meloxicam (MOBIC) 15 MG tablet TAKE 1 TABLET DAILY  Kristian Covey, MD   BP 133/73  Pulse 110  Temp(Src) 98.6 F (37 C) (Oral)  Resp 16  SpO2 99%  Physical Exam  Nursing note and vitals reviewed. Constitutional: She is oriented to person, place, and time. She appears well-developed and well-nourished.  HENT:  Head: Normocephalic and atraumatic.  Cardiovascular: Normal rate and regular rhythm.   Pulmonary/Chest: Effort normal and breath sounds normal.  Abdominal: She exhibits no distension.  Musculoskeletal: Normal range of motion.  No redness swelling or warmth to the right hand or wrist. Pt has  full rom. Pt has a small abrasion to the lateral wrist  Neurological: She is alert and oriented to person, place, and time. She exhibits normal muscle tone. Coordination normal.  Grip strength equal  Skin: Skin is warm and dry.  Psychiatric: She has a normal mood and affect.    ED Course  Procedures (including critical care time)  DIAGNOSTIC STUDIES: Oxygen Saturation is 99% on RA, normal by my interpretation.    COORDINATION OF CARE: 6:10 PM- Pt advised of plan for treatment and pt agrees.  Labs Review Labs Reviewed - No data to display  Imaging Review No results found.   EKG Interpretation None      MDM   Final diagnoses:  Animal bite  Wrist pain    No sign of infection noted. Pt is okay to follow up with hand as needed. Will splint for comfort.   I personally performed the services described in this documentation, which was scribed in my presence. The recorded information has been reviewed and is accurate.    Teressa Lower, NP 08/30/13 1820

## 2013-08-30 NOTE — ED Notes (Signed)
Pt was bitten by dog a week ago at work. Pt knows dog is up to date on rabies immunization. Pt has R hand and wrist pain. Able to move fingers.

## 2013-09-03 ENCOUNTER — Telehealth: Payer: Self-pay | Admitting: Family Medicine

## 2013-09-03 NOTE — Telephone Encounter (Signed)
Last visit 07/29/13 Last refill 08/30/13 #10 0 refill

## 2013-09-03 NOTE — Telephone Encounter (Signed)
Needs to be seen if still having severe pain.

## 2013-09-03 NOTE — Telephone Encounter (Signed)
Pt is aware and appointment is made

## 2013-09-03 NOTE — Telephone Encounter (Signed)
Pt req rx on HYDROcodone-acetaminophen (NORCO/VICODIN) 5-325 MG per tablet  Said she was bitten by a dog on 5/14 /15 and went to the ER on 08/30/13 and was given   HYDROcodone-acetaminophen (NORCO/VICODIN) 5-325 MG per tablet and is requesting a refill i ask her if they told her to follow up with her pcp she said no they told her to fup with an orthopedic doctor

## 2013-09-04 ENCOUNTER — Encounter: Payer: Self-pay | Admitting: Family Medicine

## 2013-09-04 ENCOUNTER — Ambulatory Visit (INDEPENDENT_AMBULATORY_CARE_PROVIDER_SITE_OTHER): Payer: Self-pay | Admitting: Family Medicine

## 2013-09-04 VITALS — BP 112/70 | HR 112 | Wt 100.0 lb

## 2013-09-04 DIAGNOSIS — M25539 Pain in unspecified wrist: Secondary | ICD-10-CM

## 2013-09-04 DIAGNOSIS — W540XXA Bitten by dog, initial encounter: Secondary | ICD-10-CM

## 2013-09-04 DIAGNOSIS — S51809A Unspecified open wound of unspecified forearm, initial encounter: Secondary | ICD-10-CM

## 2013-09-04 DIAGNOSIS — S51859A Open bite of unspecified forearm, initial encounter: Secondary | ICD-10-CM

## 2013-09-04 DIAGNOSIS — M25532 Pain in left wrist: Secondary | ICD-10-CM

## 2013-09-04 MED ORDER — HYDROCODONE-ACETAMINOPHEN 5-325 MG PO TABS: 1.0000 | ORAL_TABLET | Freq: Four times a day (QID) | ORAL | Status: DC | PRN

## 2013-09-04 NOTE — Progress Notes (Signed)
Pre visit review using our clinic review tool, if applicable. No additional management support is needed unless otherwise documented below in the visit note. 

## 2013-09-04 NOTE — Progress Notes (Signed)
   Subjective:    Patient ID: Leslie [name], female    DOB: November 13, 1990, 23 y.o.   MRN: [id number]  HPI Patient seen with right upper extremity pain. Recent dog bite injury. This occurred on 08/22/2013. Dog was fully vaccinated. Patient states her tetanus is up-to-date. She had puncture between the thumb and index finger. She also had some injury around the ulnar region. X-rays in ER were unremarkable. Place was placed on Augmentin empirically.  She's still having fairly severe pain around the ulnar region and also some involving the dorsal aspect of the wrist and occasional tingling sensation involving the fourth and fifth digits and occasionally spreading up to the elbow region. No definite weakness. Denies any elbow injury. Pain worse with movement but also at rest.  Wrist splint helps slightly.  Past Medical History  Diagnosis Date  . PANIC ATTACK 12/24/2009  . ADD 04/12/2006  . ASTHMA 12/24/2009  . INSOMNIA, TRANSIENT 12/24/2009  . Anxiety   . Depression   . IBS (irritable bowel syndrome)     NEGATIVE COLONSCOPY AND ENDOSCOPY  . Smoker   . IBD (inflammatory bowel disease)    Past Surgical History  Procedure Laterality Date  . Upper gastrointestinal endoscopy  2009    reports that she has been smoking Cigarettes.  She has a 3.5 pack-year smoking history. She uses smokeless tobacco. She reports that she does not drink alcohol or use illicit drugs. family history includes Alcohol abuse in her paternal uncle; Diabetes in her paternal grandfather; Heart disease in her paternal grandfather; Hyperlipidemia in her maternal grandfather; Hypertension in her paternal grandfather; Mental retardation in her maternal grandmother. Allergies  Allergen Reactions  . Tramadol Anxiety      Review of Systems  Constitutional: Negative for fever and chills.  Gastrointestinal: Negative for nausea and vomiting.  Neurological: Negative for weakness and numbness.  Hematological: Negative for  adenopathy.       Objective:   Physical Exam  Constitutional: She appears well-developed and well-nourished.  Cardiovascular: Normal rate.   Pulmonary/Chest: Effort normal and breath sounds normal. No respiratory distress. She has no wheezes. She has no rales.  Musculoskeletal: She exhibits no edema.  No visible bruising or edema or erythema.  Neurological: She is alert.  Full strength upper extremities.  Normal sensory function. Symmetric UE reflexes.          Assessment & Plan:  Recent dog bite with right upper extremity pain. Suspect pain mostly related to contusion. No evidence for tendon injury. Recent X-rays and ED notes reviewed.  No signs of secondary infection. Continue wrist splint as needed. 1 refill of hydrocodone #20 with no refills

## 2013-09-05 ENCOUNTER — Telehealth: Payer: Self-pay | Admitting: Family Medicine

## 2013-09-05 NOTE — Telephone Encounter (Signed)
Agree with advice.  Get pregnancy test first and let us know if positive.  Get immediate medical attention for any severe dizziness or increased bleeding or pain

## 2013-09-05 NOTE — Telephone Encounter (Signed)
Patient Information:  Caller Name: Zarielle  Phone: [phone]  Patient: Leslie [name], Leslie [name]  Gender: Female  DOB: 05/07/1990  Age: 23 Years  PCP: [name] [facility])  Pregnant: No  Office Follow Up:  Does the office need to follow up with this patient?: No  Instructions For The Office: N/A  RN Note:  Pt. is having multiple issues and concerns. Went to the Dr. on 09/04/13 for wrist pain. Pt. using condoms for [facility]. Advised since her cycle is late, needs to do a pregnancy test. Bleeding is not heavy now. Hydrocodone for pain helped her cramping. Pt. to monitor and call for new or worsening symptoms.  Symptoms  Reason For Call & Symptoms: No cycle in 45 days. This am (09/05/13), went to the bathroom and had severe pain and almost passed out. Started bleeding. Temp. 95.8(oral) at 10:00am this morning. Temp back up now to 97.6. at 14:00. Not sure if thermometer was working. Pt. has not done a pregnancy test. Wonders if she has endometriosis. Pt. has not changed her tampon in 4 hours.  Reviewed Health History In EMR: Yes  Reviewed Medications In EMR: Yes  Reviewed Allergies In EMR: Yes  Reviewed Surgeries / Procedures: Yes  Date of Onset of Symptoms: 09/05/2013  Treatments Tried: Hydrocodone  Treatments Tried Worked: Yes OB / GYN:  LMP: 09/05/2013  Guideline(s) Used:  Vaginal Bleeding - Abnormal  Disposition Per Guideline:   Home Care  Reason For Disposition Reached:   Mild bleeding or SPOTTING and could be pregnant (e.g., missed last period)  Advice Given:  Pregnancy Test, When in Doubt:  If there is a chance that you might be pregnant, use a urine pregnancy test.  Call back if you are pregnant  Call Back If:  Pregnancy test is positive  Bleeding becomes worse  You become worse.  Patient Will Follow Care Advice:  YES

## 2013-09-06 NOTE — Telephone Encounter (Signed)
Pt aware yesterday 09/05/13. To get a pregnancy test.

## 2013-09-25 ENCOUNTER — Telehealth: Payer: Self-pay | Admitting: Family Medicine

## 2013-09-25 NOTE — Telephone Encounter (Signed)
May refill 

## 2013-09-25 NOTE — Telephone Encounter (Signed)
Last visit 09/04/13 Last refill 08/28/13 #60 0 refill

## 2013-09-25 NOTE — Telephone Encounter (Signed)
Pt req rx on amphetamine-dextroamphetamine (ADDERALL) 20 MG tablet

## 2013-09-27 MED ORDER — AMPHETAMINE-DEXTROAMPHETAMINE 20 MG PO TABS: 20.0000 mg | ORAL_TABLET | Freq: Two times a day (BID) | ORAL | Status: DC

## 2013-09-27 NOTE — Telephone Encounter (Signed)
rx up front for p/u, pt aware 

## 2013-10-01 ENCOUNTER — Ambulatory Visit (INDEPENDENT_AMBULATORY_CARE_PROVIDER_SITE_OTHER): Payer: Self-pay | Admitting: Family Medicine

## 2013-10-01 ENCOUNTER — Encounter: Payer: Self-pay | Admitting: *Deleted

## 2013-10-01 ENCOUNTER — Encounter: Payer: Self-pay | Admitting: Family Medicine

## 2013-10-01 VITALS — BP 100/78 | HR 116 | Temp 98.8°F | Ht 64.25 in | Wt 104.0 lb

## 2013-10-01 DIAGNOSIS — R059 Cough, unspecified: Secondary | ICD-10-CM

## 2013-10-01 DIAGNOSIS — R05 Cough: Secondary | ICD-10-CM

## 2013-10-01 DIAGNOSIS — R110 Nausea: Secondary | ICD-10-CM

## 2013-10-01 DIAGNOSIS — K589 Irritable bowel syndrome without diarrhea: Secondary | ICD-10-CM

## 2013-10-01 DIAGNOSIS — J0100 Acute maxillary sinusitis, unspecified: Secondary | ICD-10-CM

## 2013-10-01 DIAGNOSIS — R350 Frequency of micturition: Secondary | ICD-10-CM

## 2013-10-01 LAB — POCT URINALYSIS DIPSTICK
BILIRUBIN UA: NEGATIVE
Blood, UA: NEGATIVE
Glucose, UA: NEGATIVE
Ketones, UA: NEGATIVE
LEUKOCYTES UA: NEGATIVE
Nitrite, UA: NEGATIVE
Protein, UA: NEGATIVE
Spec Grav, UA: 1.015
UROBILINOGEN UA: 0.2
pH, UA: 6.5

## 2013-10-01 MED ORDER — AZITHROMYCIN 250 MG PO TABS: ORAL_TABLET | ORAL | Status: DC

## 2013-10-01 MED ORDER — BENZONATATE 100 MG PO CAPS: 100.0000 mg | ORAL_CAPSULE | Freq: Two times a day (BID) | ORAL | Status: DC | PRN

## 2013-10-01 NOTE — Patient Instructions (Signed)
-  As we discussed, we have prescribed a new medication for you at this appointment. We discussed the common and serious potential adverse effects of this medication and you can review these and more with the pharmacist when you pick up your medication.  Please follow the instructions for use carefully and notify us immediately if you have any problems taking this medication.  --We have ordered labs or studies at this visit. It can take up to 1-2 weeks for results and processing. We will contact you with instructions IF your results are abnormal. Normal results will be released to your [facility]. If you have not heard from us or can not find your results in [facility] in 2 weeks please contact our office.  -please call your gastroenterologist if you feel your bowel/nausea symptoms are worsening or changing

## 2013-10-01 NOTE — Progress Notes (Signed)
Pre visit review using our clinic review tool, if applicable. No additional management support is needed unless otherwise documented below in the visit note. 

## 2013-10-01 NOTE — Progress Notes (Signed)
No chief complaint on file.   HPI:  Acute visit for:  1) -started: 2.5 weeks ago -symptoms:nasal congestion, sore throat, cough, sinus pain recenlty, low grade temp -denies:fever, SOB, NVD, tooth pain -has tried: OTC products -sick contacts/travel/risks: denies flu exposure, tick exposure or or Ebola risks -Hx of: allergies, mild asthma, reports has gotten zpack and cough medication in the past and prefers this as other medications upset her stomach  2)dysuria -tries to drink a lot of water at work but then has to urinate more  3)chornic nausea: -eval in the past in [place] per her report - told IBS -denies: weight loss, vomiting, diarrhea, melena, hematochezia ROS: See pertinent positives and negatives per HPI.  Past Medical History  Diagnosis Date  . PANIC ATTACK 12/24/2009  . ADD 04/12/2006  . ASTHMA 12/24/2009  . INSOMNIA, TRANSIENT 12/24/2009  . Anxiety   . Depression   . IBS (irritable bowel syndrome)     NEGATIVE COLONSCOPY AND ENDOSCOPY  . Smoker   . IBD (inflammatory bowel disease)     Past Surgical History  Procedure Laterality Date  . Upper gastrointestinal endoscopy  2009    Family History  Problem Relation Age of Onset  . Alcohol abuse Paternal Uncle   . Mental retardation Maternal Grandmother   . Hyperlipidemia Maternal Grandfather   . Diabetes Paternal Grandfather   . Hypertension Paternal Grandfather   . Heart disease Paternal Grandfather     History   Social History  . Marital Status: Single    Spouse Name: N/A    Number of Children: N/A  . Years of Education: N/A   Social History Main Topics  . Smoking status: Current Every Day Smoker -- 0.50 packs/day for 7 years    Types: Cigarettes  . Smokeless tobacco: Current User  . Alcohol Use: No  . Drug Use: No  . Sexual Activity: Yes    Birth Control/ Protection: Pill   Other Topics Concern  . None   Social History Narrative  . None    Current outpatient prescriptions:Albuterol Sulfate  (PROAIR HFA IN), Inhale into the lungs., Disp: , Rfl: ;  amphetamine-dextroamphetamine (ADDERALL) 20 MG tablet, Take 1 tablet (20 mg total) by mouth 2 (two) times daily., Disp: 60 tablet, Rfl: 0;  clonazePAM (KLONOPIN) 1 MG tablet, TAKE 1 TABLET BY MOUTH TWICE DAILY AS NEEDED, Disp: 60 tablet, Rfl: 0 HYDROcodone-acetaminophen (NORCO/VICODIN) 5-325 MG per tablet, Take 1-2 tablets by mouth every 6 (six) hours as needed., Disp: 20 tablet, Rfl: 0;  albuterol (PROVENTIL HFA;VENTOLIN HFA) 108 (90 BASE) MCG/ACT inhaler, Inhale 2 puffs into the lungs every 6 (six) hours as needed., Disp: , Rfl: ;  azithromycin (ZITHROMAX) 250 MG tablet, 2 tabs on first day then on tab daily for 4 more days, Disp: 6 tablet, Rfl: 0 benzonatate (TESSALON) 100 MG capsule, Take 1 capsule (100 mg total) by mouth 2 (two) times daily as needed for cough., Disp: 20 capsule, Rfl: 0  EXAM:  Filed Vitals:   10/01/13 1015  BP: 100/78  Pulse: 116  Temp: 98.8 F (37.1 C)    Body mass index is 17.71 kg/(m^2).  GENERAL: vitals reviewed and listed above, alert, oriented, appears well hydrated and in no acute distress  HEENT: atraumatic, conjunttiva clear, no obvious abnormalities on inspection of external nose and ears, normal appearance of ear canals and TMs, white nasal congestion, mild post oropharyngeal erythema with PND, no tonsillar edema or exudate, no sinus TTP  NECK: no obvious masses on inspection  LUNGS: clear to auscultation bilaterally, no wheezes, rales or rhonchi, good air movement  CV: HRRR, no peripheral edema  MS: moves all extremities without noticeable abnormality  PSYCH: pleasant and cooperative, no obvious depression or anxiety  ASSESSMENT AND PLAN:  Discussed the following assessment and plan:  Cough - Plan: benzonatate (TESSALON) 100 MG capsule Acute maxillary sinusitis, recurrence not specified - Plan: azithromycin (ZITHROMAX) 250 MG tablet -discuss medications, risks, return precuations  Urinary  frequency -udip  IBS (irritable bowel syndrome)/Nausea alone: -chronic, no alarm features, followed by GI  -of course, we advised to return or notify a doctor immediately if symptoms worsen or persist or new concerns arise.    Patient Instructions  -As we discussed, we have prescribed a new medication for you at this appointment. We discussed the common and serious potential adverse effects of this medication and you can review these and more with the pharmacist when you pick up your medication.  Please follow the instructions for use carefully and notify us immediately if you have any problems taking this medication.  --We have ordered labs or studies at this visit. It can take up to 1-2 weeks for results and processing. We will contact you with instructions IF your results are abnormal. Normal results will be released to your [facility]. If you have not heard from us or can not find your results in [facility] in 2 weeks please contact our office.  -please call your gastroenterologist if you feel your bowel/nausea symptoms are worsening or changing            KIM, HANNAH R.

## 2013-10-01 NOTE — Addendum Note (Signed)
Addended by: Johnella MoloneyFUNDERBURK, JO A on: 10/01/2013 10:42 AM   Modules accepted: Orders

## 2013-10-04 ENCOUNTER — Telehealth: Payer: Self-pay | Admitting: Family Medicine

## 2013-10-04 NOTE — Telephone Encounter (Signed)
Pt called back to speak w/ office manager and ask if a note would be able to be given to her for work today. Per Dr Selena BattenKim, no note can be given to pt for being out of work today. Transferred pt to [name]'s VM

## 2013-10-04 NOTE — Telephone Encounter (Signed)
Patient Information:  Caller Name: Leslie [name]  Phone: [phone]  Patient: Leslie [name], Leslie [name]  Gender: Female  DOB: 05-15-1990  Age: 23 Years  PCP: Evelena PeatBurchette, [facility](Family Practice)  Pregnant: No  Office Follow Up:  Does the office need to follow up with this patient?: Yes  Instructions For The Office: SEE RN NOTE.  RN Note:  PER PROTOCOL,PATIENT TO BE SEEN IN OFFICE WITHIN 4 HOURS FOR SYMPTOMS DESCRIBED. HOWEVER, PLEASE ADVISE OFFICE VISIT VS. IMMEDIATE COMPLIANCE TO Z-PACK PRESCRIBED ON 6/23 WHICH PATIENT HAS NOT BEEN TAKING. THANK YOU.  Symptoms  Reason For Call & Symptoms: Patient reports onset of cold symptoms 6/8 with office evaluation on 6/23 with Tessalon and Z-pack prescribed. Patient has not been taking either medicaation but rather took Augmentin she had on hand instead due to cost of Z-pack. Patient reports symptoms have worsned since evaluation on 6/23 to include fever, worsened cough that she describes as "rattling down in lungs" and accompanied by wheezing at night. Patient also concerned by seeing spots and dots and feeling faint if not resting. Patient also requesting work note stating her work involves being outside and the heat exacerbates the symptoms.  Reviewed Health History In EMR: Yes  Reviewed Medications In EMR: Yes  Reviewed Allergies In EMR: Yes  Reviewed Surgeries / Procedures: Yes  Date of Onset of Symptoms: 09/16/2013  Treatments Tried: Augmentin (not prescribed for these symptoms), Claritin, Cough Drops  Treatments Tried Worked: No  Any Fever: Yes  Fever Taken: Oral  Fever Time Of Reading: 05:00:00  Fever Last Reading: 100.6 OB / GYN:  LMP: 08/07/2013  Guideline(s) Used:  Cough  Disposition Per Guideline:   Go to Office Now  Reason For Disposition Reached:   Wheezing is present  Advice Given:  N/A  Patient Will Follow Care Advice:  YES

## 2013-10-04 NOTE — Telephone Encounter (Signed)
I called the pt and informed her per Dr Selena BattenKim due to the symptoms she is having she needs an appt to be seen and a work note will not be given.  I advised her our office does not have any openings today and asked her to call back after 1pm and our office could schedule her with the urgent care.  She stated she feels the same as she did the day she saw Dr Selena BattenKim and has been seeing spots and works out in the heat and needs a note.  I again advised her Dr Selena BattenKim she needs to be seen and did not approve a work note and asked she call us back after 1pm and she agreed.

## 2013-10-04 NOTE — Telephone Encounter (Signed)
Needs appt

## 2013-10-04 NOTE — Telephone Encounter (Signed)
Returned called to patient and informed per Dr. Elmyra RicksKim's instructions she would need to be reevaluated before note would be given.  Patient more concerned with cost of visit due to no insurance.  Patient reports she has been having fevers at night and in the early mornings ranging up to 101.  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  Patient was offered appt at our Saturday clinic.  However, she declined.  Patient request to see PCP on Monday, appt was scheduled with PCP Monday, 6/29 at 945am.

## 2013-10-04 NOTE — Telephone Encounter (Signed)
I called the pt and informed her again that per Dr Selena BattenKim she needs an appt.  I offered to transfer her to someone to schedule an appt with the urgent care and she stated she cannot afford to pay again and she had the same symptoms when she was seen by Dr Selena BattenKim and feels she was rushed in and out at the appt and would like to talk to a Production designer, theatre/television/filmmanager.  This message was forwarded to [place].

## 2013-10-04 NOTE — Telephone Encounter (Signed)
Pt called to say that she needed a doctors note for work was told by Marvia PicklesJo Ann she needed  an appt since she was having new symptoms. Pt said she felt like she did not need to be seen since her symptoms only happen when she cough or is outside in the heat.

## 2013-10-04 NOTE — Telephone Encounter (Signed)
Needs appt. - symptoms very different from when she saw me per nurse report. I advise appt.

## 2013-10-07 ENCOUNTER — Telehealth: Payer: Self-pay | Admitting: Family Medicine

## 2013-10-07 ENCOUNTER — Encounter: Payer: Self-pay | Admitting: Family Medicine

## 2013-10-07 ENCOUNTER — Ambulatory Visit (INDEPENDENT_AMBULATORY_CARE_PROVIDER_SITE_OTHER): Payer: Self-pay | Admitting: Family Medicine

## 2013-10-07 VITALS — BP 122/70 | HR 98 | Temp 97.5°F | Wt 103.0 lb

## 2013-10-07 DIAGNOSIS — J069 Acute upper respiratory infection, unspecified: Secondary | ICD-10-CM

## 2013-10-07 DIAGNOSIS — B9789 Other viral agents as the cause of diseases classified elsewhere: Principal | ICD-10-CM

## 2013-10-07 NOTE — Patient Instructions (Signed)
Acute Bronchitis °Bronchitis is inflammation of the airways that extend from the windpipe into the lungs (bronchi). The inflammation often causes mucus to develop. This leads to a cough, which is the most common symptom of bronchitis.  °In acute bronchitis, the condition usually develops suddenly and goes away over time, usually in a couple weeks. Smoking, allergies, and asthma can make bronchitis worse. Repeated episodes of bronchitis may cause further lung problems.  °CAUSES °Acute bronchitis is most often caused by the same virus that causes a cold. The virus can spread from person to person (contagious).  °SIGNS AND SYMPTOMS  °· Cough.   °· Fever.   °· Coughing up mucus.   °· Body aches.   °· Chest congestion.   °· Chills.   °· Shortness of breath.   °· Sore throat.   °DIAGNOSIS  °Acute bronchitis is usually diagnosed through a physical exam. Tests, such as chest X-rays, are sometimes done to rule out other conditions.  °TREATMENT  °Acute bronchitis usually goes away in a couple weeks. Often times, no medical treatment is necessary. Medicines are sometimes given for relief of fever or cough. Antibiotics are usually not needed but may be prescribed in certain situations. In some cases, an inhaler may be recommended to help reduce shortness of breath and control the cough. A cool mist vaporizer may also be used to help thin bronchial secretions and make it easier to clear the chest.  °HOME CARE INSTRUCTIONS °· Get plenty of rest.   °· Drink enough fluids to keep your urine clear or pale yellow (unless you have a medical condition that requires fluid restriction). Increasing fluids may help thin your secretions and will prevent dehydration.   °· Only take over-the-counter or prescription medicines as directed by your health care provider.   °· Avoid smoking and secondhand smoke. Exposure to cigarette smoke or irritating chemicals will make bronchitis worse. If you are a smoker, consider using nicotine gum or skin  patches to help control withdrawal symptoms. Quitting smoking will help your lungs heal faster.   °· Reduce the chances of another bout of acute bronchitis by washing your hands frequently, avoiding people with cold symptoms, and trying not to touch your hands to your mouth, nose, or eyes.   °· Follow up with your health care provider as directed.   °SEEK MEDICAL CARE IF: °Your symptoms do not improve after 1 week of treatment.  °SEEK IMMEDIATE MEDICAL CARE IF: °· You develop an increased fever or chills.   °· You have chest pain.   °· You have severe shortness of breath. °· You have bloody sputum.   °· You develop dehydration. °· You develop fainting. °· You develop repeated vomiting. °· You develop a severe headache. °MAKE SURE YOU:  °· Understand these instructions. °· Will watch your condition. °· Will get help right away if you are not doing well or get worse. °Document Released: 05/05/2004 Document Revised: 11/28/2012 Document Reviewed: 09/18/2012 °ExitCare® Patient Information ©2015 [employer]. This information is not intended to replace advice given to you by your health care provider. Make sure you discuss any questions you have with your health care provider. ° °

## 2013-10-07 NOTE — Progress Notes (Signed)
   Subjective:    Patient ID: Leslie [name], female    DOB: 12/13/1990, 23 y.o.   MRN: [id number]  Cough Pertinent negatives include no chills, fever, sore throat or wheezing.   Patient seen with persistent respiratory symptoms. Onset around June 8. She developed URI symptoms. She has a persistent cough which is occasionally productive . No fever. Increased malaise. Right maxillary facial pain. Patient was seen last week and prescribed Zithromax though she never started that. She had leftover amoxicillin which she took instead but only took for 2 or 3 days.  Past Medical History  Diagnosis Date  . PANIC ATTACK 12/24/2009  . ADD 04/12/2006  . ASTHMA 12/24/2009  . INSOMNIA, TRANSIENT 12/24/2009  . Anxiety   . Depression   . IBS (irritable bowel syndrome)     NEGATIVE COLONSCOPY AND ENDOSCOPY  . Smoker   . IBD (inflammatory bowel disease)    Past Surgical History  Procedure Laterality Date  . Upper gastrointestinal endoscopy  2009    reports that she has been smoking Cigarettes.  She has a 3.5 pack-year smoking history. She uses smokeless tobacco. She reports that she does not drink alcohol or use illicit drugs. family history includes Alcohol abuse in her paternal uncle; Diabetes in her paternal grandfather; Heart disease in her paternal grandfather; Hyperlipidemia in her maternal grandfather; Hypertension in her paternal grandfather; Mental retardation in her maternal grandmother. Allergies  Allergen Reactions  . Tramadol Anxiety      Review of Systems  Constitutional: Positive for fatigue. Negative for fever and chills.  HENT: Positive for congestion. Negative for sore throat.   Respiratory: Positive for cough. Negative for wheezing.        Objective:   Physical Exam  Constitutional: She appears well-developed and well-nourished.  HENT:  Mouth/Throat: Oropharynx is clear and moist.  Cerumen impaction right canal partially removed with curette  Neck: Neck supple. No  thyromegaly present.  Cardiovascular: Normal rate.   Pulmonary/Chest: Effort normal and breath sounds normal. No respiratory distress. She has no wheezes. She has no rales.  Lymphadenopathy:    She has no cervical adenopathy.          Assessment & Plan:  Upper respiratory infection. Suspect this may be all viral. Recommend observation at this point. Work note written for last week. Start Zithromax if she has any persistent facial pain or recurrent fever

## 2013-10-07 NOTE — Progress Notes (Signed)
Pre visit review using our clinic review tool, if applicable. No additional management support is needed unless otherwise documented below in the visit note. 

## 2013-10-07 NOTE — Telephone Encounter (Signed)
Relevant patient education mailed to patient.  

## 2013-10-22 ENCOUNTER — Telehealth: Payer: Self-pay | Admitting: Family Medicine

## 2013-10-22 NOTE — Telephone Encounter (Signed)
Last visit 10/07/13 Last refill 08/27/13 #60 0 refill

## 2013-10-22 NOTE — Telephone Encounter (Signed)
[id number], [place] - [address] is requesting re-fill on clonazePAM (KLONOPIN) 1 MG tablet

## 2013-10-22 NOTE — Telephone Encounter (Signed)
Refill once 

## 2013-10-23 MED ORDER — CLONAZEPAM 1 MG PO TABS: ORAL_TABLET | ORAL | Status: DC

## 2013-10-23 NOTE — Telephone Encounter (Signed)
RX called into pharmacy

## 2013-10-29 ENCOUNTER — Telehealth: Payer: Self-pay | Admitting: Family Medicine

## 2013-10-29 NOTE — Telephone Encounter (Signed)
Pt need new rx generic adderall 20 mg °

## 2013-10-30 NOTE — Telephone Encounter (Signed)
Refill OK,  Let's check her weight once more and if stable, may refill for 3 months,

## 2013-10-30 NOTE — Telephone Encounter (Signed)
Last visit 10/07/13 Last refill 09/27/13 #60 0 refill

## 2013-10-30 NOTE — Telephone Encounter (Signed)
Called patient and no VM is set up to leave a message

## 2013-10-31 ENCOUNTER — Ambulatory Visit (INDEPENDENT_AMBULATORY_CARE_PROVIDER_SITE_OTHER): Payer: Self-pay | Admitting: Family Medicine

## 2013-10-31 ENCOUNTER — Encounter: Payer: Self-pay | Admitting: Family Medicine

## 2013-10-31 VITALS — BP 120/80 | HR 116 | Wt 101.9 lb

## 2013-10-31 DIAGNOSIS — L42 Pityriasis rosea: Secondary | ICD-10-CM

## 2013-10-31 MED ORDER — AMPHETAMINE-DEXTROAMPHETAMINE 20 MG PO TABS
20.0000 mg | ORAL_TABLET | Freq: Two times a day (BID) | ORAL | Status: DC
Start: 2013-10-31 — End: 2014-03-14

## 2013-10-31 MED ORDER — TRAMADOL HCL 50 MG PO TABS: 50.0000 mg | ORAL_TABLET | Freq: Three times a day (TID) | ORAL | Status: DC | PRN

## 2013-10-31 MED ORDER — ACYCLOVIR 800 MG PO TABS: 800.0000 mg | ORAL_TABLET | Freq: Every day | ORAL | Status: DC

## 2013-10-31 MED ORDER — AMPHETAMINE-DEXTROAMPHETAMINE 20 MG PO TABS: 20.0000 mg | ORAL_TABLET | Freq: Two times a day (BID) | ORAL | Status: DC

## 2013-10-31 NOTE — Patient Instructions (Signed)
Pityriasis Rosea  Pityriasis rosea is a rash which is probably caused by a virus. It generally starts as a scaly, red patch on the trunk (the area of the body that a t-shirt would cover) but does not appear on sun exposed areas. The rash is usually preceded by an initial larger spot called the "herald patch" a week or more before the rest of the rash appears. Generally within one to two days the rash appears rapidly on the trunk, upper arms, and sometimes the upper legs. The rash usually appears as flat, oval patches of scaly pink color. The rash can also be raised and one is able to feel it with a finger. The rash can also be finely crinkled and may slough off leaving a ring of scale around the spot. Sometimes a mild sore throat is present with the rash. It usually affects children and young adults in the spring and autumn. Women are more frequently affected than men.  TREATMENT   Pityriasis rosea is a self-limited condition. This means it goes away within 4 to 8 weeks without treatment. The spots may persist for several months, especially in darker-colored skin after the rash has resolved and healed. Benadryl and steroid creams may be used if itching is a problem.  SEEK MEDICAL CARE IF:   · Your rash does not go away or persists longer than three months.  · You develop fever and joint pain.  · You develop severe headache and confusion.  · You develop breathing difficulty, vomiting and/or extreme weakness.  Document Released: 05/04/2001 Document Revised: 06/20/2011 Document Reviewed: 05/23/2008  ExitCare® Patient Information ©2015 [employer]. This information is not intended to replace advice given to you by your health care provider. Make sure you discuss any questions you have with your health care provider.

## 2013-10-31 NOTE — Progress Notes (Signed)
   Subjective:    Patient ID: Leslie [name], female    DOB: 25-Feb-1991, 23 y.o.   MRN: [id number]  Rash Associated symptoms include fatigue. Pertinent negatives include no fever or sore throat.   Patient seen with skin rash. Onset this past Saturday. She noticed erythematous patch along her lower abdomen with slightly scaly surface. Since that time, she has had spread to her anterior abdomen and chest and back. Minimal itching. Mild burning. She tried hydrocortisone cream and Coconut oil without improvement. Denies any involvement of her upper or lower extremities. No history of similar rash. No recent fevers or chills.  She's had some significant wrist pain and has seen orthopedist. Requesting tramadol. She's trying to use hydrocodone sparingly. She has not noted any visible swelling and x-rays have been negative  Past Medical History  Diagnosis Date  . PANIC ATTACK 12/24/2009  . ADD 04/12/2006  . ASTHMA 12/24/2009  . INSOMNIA, TRANSIENT 12/24/2009  . Anxiety   . Depression   . IBS (irritable bowel syndrome)     NEGATIVE COLONSCOPY AND ENDOSCOPY  . Smoker   . IBD (inflammatory bowel disease)    Past Surgical History  Procedure Laterality Date  . Upper gastrointestinal endoscopy  2009    reports that she has been smoking Cigarettes.  She has a 3.5 pack-year smoking history. She uses smokeless tobacco. She reports that she does not drink alcohol or use illicit drugs. family history includes Alcohol abuse in her paternal uncle; Diabetes in her paternal grandfather; Heart disease in her paternal grandfather; Hyperlipidemia in her maternal grandfather; Hypertension in her paternal grandfather; Mental retardation in her maternal grandmother. Allergies  Allergen Reactions  . Tramadol Anxiety      Review of Systems  Constitutional: Positive for fatigue. Negative for fever and chills.  HENT: Negative for sore throat.   Musculoskeletal: Positive for myalgias.  Skin: Positive for rash.    Hematological: Negative for adenopathy.       Objective:   Physical Exam  Constitutional: She appears well-developed and well-nourished.  HENT:  Mouth/Throat: Oropharynx is clear and moist.  Neck: Neck supple.  Cardiovascular: Normal rate.   Pulmonary/Chest: Effort normal and breath sounds normal. No respiratory distress. She has no wheezes. She has no rales.  Lymphadenopathy:    She has no cervical adenopathy.  Skin: Rash noted.  Patient has scattered rash on her anterior chest and abdomen as well as back. She has erythematous macular rash with slightly scaly surface          Assessment & Plan:  Pityriasis rosea. Explained this is generally self-limited. We explained this frequently lasts for several weeks before resolving. We gave option of treating with acyclovir 800 mg 5 times daily for 7 days (see Dyn[employer] reference). Otherwise, treat symptomatically with antihistamines.

## 2013-10-31 NOTE — Progress Notes (Signed)
Pre visit review using our clinic review tool, if applicable. No additional management support is needed unless otherwise documented below in the visit note. 

## 2013-10-31 NOTE — Telephone Encounter (Signed)
Pt is coming into the office to be seen

## 2013-11-18 ENCOUNTER — Telehealth: Payer: Self-pay | Admitting: Family Medicine

## 2013-11-18 NOTE — Telephone Encounter (Signed)
[id number] [place], [place] - [address] is requesting re-fill on clonazePAM (KLONOPIN) 1 MG tablet

## 2013-11-20 NOTE — Telephone Encounter (Signed)
Re-fill request was received for below medication

## 2013-11-20 NOTE — Telephone Encounter (Signed)
Was this not last filled 10-23-13?  If so, may refill by 11-22-13.  She should not be exceeding one every 12 hours.

## 2013-11-20 NOTE — Telephone Encounter (Signed)
Last visit 10/31/13 Last refill 10/31/13 #60 0 refill

## 2013-11-22 MED ORDER — CLONAZEPAM 1 MG PO TABS: ORAL_TABLET | ORAL | Status: DC

## 2013-11-22 NOTE — Telephone Encounter (Signed)
Called in RX 

## 2013-12-20 ENCOUNTER — Telehealth: Payer: Self-pay | Admitting: Family Medicine

## 2013-12-20 MED ORDER — CLONAZEPAM 1 MG PO TABS: ORAL_TABLET | ORAL | Status: DC

## 2013-12-20 NOTE — Telephone Encounter (Signed)
Refill OK

## 2013-12-20 NOTE — Telephone Encounter (Signed)
Pt request refill clonazePAM (KLONOPIN) 1 MG tablet [facility]  Pt states rx will run out Sun and request rx be available this weekend

## 2013-12-20 NOTE — Telephone Encounter (Signed)
Rx called in to pharmacy. 

## 2013-12-20 NOTE — Telephone Encounter (Signed)
Last visit 10/31/13 Last refill 11/22/13 #60 0 refill

## 2014-01-02 ENCOUNTER — Encounter: Payer: Self-pay | Admitting: Family Medicine

## 2014-01-02 ENCOUNTER — Ambulatory Visit (INDEPENDENT_AMBULATORY_CARE_PROVIDER_SITE_OTHER): Payer: Self-pay | Admitting: Family Medicine

## 2014-01-02 VITALS — BP 110/74 | HR 104 | Wt 102.0 lb

## 2014-01-02 DIAGNOSIS — R55 Syncope and collapse: Secondary | ICD-10-CM

## 2014-01-02 DIAGNOSIS — N946 Dysmenorrhea, unspecified: Secondary | ICD-10-CM

## 2014-01-02 MED ORDER — NORETHINDRONE ACET-ETHINYL EST 1.5-30 MG-MCG PO TABS: 1.0000 | ORAL_TABLET | Freq: Every day | ORAL | Status: DC

## 2014-01-02 MED ORDER — TRAMADOL HCL 50 MG PO TABS: 50.0000 mg | ORAL_TABLET | Freq: Three times a day (TID) | ORAL | Status: DC | PRN

## 2014-01-02 NOTE — Patient Instructions (Signed)

## 2014-01-02 NOTE — Progress Notes (Signed)
Pre visit review using our clinic review tool, if applicable. No additional management support is needed unless otherwise documented below in the visit note. 

## 2014-01-02 NOTE — Progress Notes (Signed)
   Subjective:    Patient ID: Leslie [name], female    DOB: 1991/01/26, 23 y.o.   MRN: [id number]  HPI Patient seen with painful menstrual cramping most months. Her periods tend to be very regular. She is usually have 4 days of heavy bleeding and then 2 days of light bleeding. She is on current menstrual cycle now -started yesterday. She quit smoking 2 months ago. She has severe cramping of her uterus. She states this pain is distinct from her IBS symptoms.  Yesterday, she was urinating and also having severe menstrual cramping and had what sounds like brief syncopal episode. She did not have any urine or stool incontinence. She has not had any consistent orthostatic symptoms. No history of any known recent anemia. She's taken tramadol with mild relief. Generally has not had good relief with nonsteroidals in past. She has taken low-dose birth control the past which may have helped.  She is sexually active but uses barrier protection consistently.  Past Medical History  Diagnosis Date  . PANIC ATTACK 12/24/2009  . ADD 04/12/2006  . ASTHMA 12/24/2009  . INSOMNIA, TRANSIENT 12/24/2009  . Anxiety   . Depression   . IBS (irritable bowel syndrome)     NEGATIVE COLONSCOPY AND ENDOSCOPY  . Smoker   . IBD (inflammatory bowel disease)    Past Surgical History  Procedure Laterality Date  . Upper gastrointestinal endoscopy  2009    reports that she has been smoking Cigarettes.  She has a 3.5 pack-year smoking history. She uses smokeless tobacco. She reports that she does not drink alcohol or use illicit drugs. family history includes Alcohol abuse in her paternal uncle; Diabetes in her paternal grandfather; Heart disease in her paternal grandfather; Hyperlipidemia in her maternal grandfather; Hypertension in her paternal grandfather; Mental retardation in her maternal grandmother. Allergies  Allergen Reactions  . Tramadol Anxiety      Review of Systems  Constitutional: Negative for fever and  chills.  Respiratory: Negative for shortness of breath.   Gastrointestinal: Negative for abdominal pain.  Genitourinary: Positive for pelvic pain. Negative for vaginal discharge.  Neurological: Positive for syncope and light-headedness.       Objective:   Physical Exam  Constitutional: She appears well-developed and well-nourished.  Cardiovascular: Normal rate and regular rhythm.   Pulmonary/Chest: Breath sounds normal. No respiratory distress. She has no wheezes. She has no rales.  Abdominal: Soft. Bowel sounds are normal. She exhibits no distension. There is no tenderness. There is no rebound and no guarding.  Musculoskeletal: She exhibits no edema.  Skin:  Nailbeds are pink and conjunctiva are pink in color          Assessment & Plan:  Dysmenorrhea. She has painful periods but fairly regular cycles. We've recommended anti-inflammatory such as naproxen or Motrin. She has prescription Naproxen at home. We discussed initiation of low dose oral contraception. We've recommended Pap smear but she is currently without insurance and wishes to defer for couple months until she is covered. We discussed checking CBC but again she is reluctant because of cost issues with not being insured. Clinically, she does not appear anemic. Highly suspect her syncopal episode yesterday was vasovagal she is having some pain and in the process of urinating at the time that she may have had brief syncope. Nonfocal exam today  Blood pressure 102 sitting and 90/50 standing. Increase hydration and liberalize sodium intake somewhat. Followup promptly for any further episodes of dizziness or syncope.

## 2014-01-13 ENCOUNTER — Other Ambulatory Visit: Payer: Self-pay | Admitting: Family Medicine

## 2014-01-14 NOTE — Telephone Encounter (Signed)
Last visit 01/02/14 Last refill  12/20/13 #60 0 refill

## 2014-01-14 NOTE — Telephone Encounter (Signed)
Refill for 6 months. 

## 2014-01-16 ENCOUNTER — Other Ambulatory Visit: Payer: Self-pay | Admitting: Family Medicine

## 2014-01-30 ENCOUNTER — Telehealth: Payer: Self-pay | Admitting: Family Medicine

## 2014-01-30 NOTE — Telephone Encounter (Signed)
Pt is requesting a rx on hydrocodone she said she only need a few days worth for her cramps. She said tramadol did not really help  Her dad phone number [phone]

## 2014-01-30 NOTE — Telephone Encounter (Signed)
Last visit 01/02/14

## 2014-01-31 MED ORDER — HYDROCODONE-ACETAMINOPHEN 5-325 MG PO TABS: 1.0000 | ORAL_TABLET | Freq: Four times a day (QID) | ORAL | Status: DC | PRN

## 2014-01-31 NOTE — Telephone Encounter (Signed)
May refill #20 only.  Should not be taking regularly

## 2014-01-31 NOTE — Telephone Encounter (Signed)
Left message that Rx is ready for pick up

## 2014-03-14 ENCOUNTER — Encounter: Payer: Self-pay | Admitting: Family Medicine

## 2014-03-14 ENCOUNTER — Ambulatory Visit (INDEPENDENT_AMBULATORY_CARE_PROVIDER_SITE_OTHER): Payer: Self-pay | Admitting: Family Medicine

## 2014-03-14 VITALS — BP 136/79 | HR 107 | Temp 101.5°F | Resp 18 | Ht 65.0 in | Wt 105.0 lb

## 2014-03-14 DIAGNOSIS — R112 Nausea with vomiting, unspecified: Secondary | ICD-10-CM

## 2014-03-14 DIAGNOSIS — R109 Unspecified abdominal pain: Secondary | ICD-10-CM

## 2014-03-14 DIAGNOSIS — R350 Frequency of micturition: Secondary | ICD-10-CM

## 2014-03-14 DIAGNOSIS — R509 Fever, unspecified: Secondary | ICD-10-CM

## 2014-03-14 DIAGNOSIS — K529 Noninfective gastroenteritis and colitis, unspecified: Secondary | ICD-10-CM

## 2014-03-14 LAB — POCT URINALYSIS DIPSTICK
BILIRUBIN UA: NEGATIVE
Blood, UA: NEGATIVE
Glucose, UA: NEGATIVE
Ketones, UA: NEGATIVE
LEUKOCYTES UA: NEGATIVE
Nitrite, UA: NEGATIVE
PROTEIN UA: NEGATIVE
Spec Grav, UA: 1.01
Urobilinogen, UA: 0.2
pH, UA: 7.5

## 2014-03-14 LAB — POCT URINE PREGNANCY: Preg Test, Ur: NEGATIVE

## 2014-03-14 MED ORDER — HYDROCODONE-ACETAMINOPHEN 5-325 MG PO TABS: 1.0000 | ORAL_TABLET | Freq: Four times a day (QID) | ORAL | Status: DC | PRN

## 2014-03-14 MED ORDER — PROMETHAZINE HCL 12.5 MG PO TABS: ORAL_TABLET | ORAL | Status: DC

## 2014-03-14 MED ORDER — PROMETHAZINE HCL 12.5 MG RE SUPP: RECTAL | Status: DC

## 2014-03-14 NOTE — Progress Notes (Signed)
OFFICE NOTE  03/14/2014  CC:  Chief Complaint  Patient presents with  . Emesis    x 3 days  . Diarrhea  . Abdominal Cramping    very sharp in Right side   HPI: Patient is a 23 y.o. Caucasian female who is here for 2-3 illness that started with nausea, this progressed to dry heaving but able to eat/drink.  Yesterday actually vomited some yellowish orange liquid.  During this time she has been cramping in RLQ.  Says this is a severe, sharp pain--lasts a few minutes.  Also had onset of watery bm's 2-3 days ago.   Had >10 watery BMs yesterday, maybe 5 the day of onset.  Today the stools are a bit more solid but has had 5 already.  A bit mucousy-appearing but no blood.  Has felt feverish last few days with this.    ROS: chest pain-dull in upper region on left last night, felt panicky.  No antibiotics in last month. No acute joint swelling or pains.  No rash.  No ST.  No URI sx's or cough.  +HAs last few days. LMP 12 days ago, 1 day of spotting.  Just restarted OCPs 3 mo ago to help with severe dysmenorrhea.  No hx of menstrual irregularity or menorrhagia.  First 2 menses on the pills similar to most recent menses.  Pertinent PMH:  Past medical, surgical, social, and family history reviewed and no changes are noted since last office visit.  MEDS:  Loestrin qd, clonazepam $RemoveBefore'1mg'GthKswyfOGIJi$  bid prn, imodium qid prn  PE: Blood pressure 136/79, pulse 107, temperature 101.5 F (38.6 C), temperature source Temporal, resp. rate 18, height $RemoveBe'5\' 5"'iRxOHxfxv$  (1.651 m), weight 105 lb (47.628 kg), last menstrual period 03/10/2014, SpO2 100 %. Gen: Alert, well appearing.  Patient is oriented to person, place, time, and situation. JJK:KXFG: no injection, icteris, swelling, or exudate.  EOMI, PERRLA. Mouth: lips without lesion/swelling.  Oral mucosa pink and moist. Oropharynx without erythema, exudate, or swelling.  Neck - No masses or thyromegaly or limitation in range of motion CV: RRR, no m/r/g.   LUNGS: CTA bilat,  nonlabored resps, good aeration in all lung fields. ABD: soft, nondistended, mild/mod tenderness to palpation diffusely except for LUQ and epigastric region.  RLQ is the area of most tenderness.  No guarding or rebound.  BS normal everywhere except hyperactive in LLQ. EXT: no clubbing, cyanosis, or edema.   LAB: CC UA today was normal UPT today was negative.   IMPRESSION AND PLAN:  Acute gastroenteritis. Continue imodium, add phenergan (suppos and tabs rx'd today, 12.$RemoveBefore'5mg'[id number]$ ), oral hydration importance reiterated.   She has had adverse response to antispasmodic med in the past, so I chose to give #20 vicodin 5/325 to use 1-2 q6h prn for abd cramping.  If this illness, particularly the diarrhea, is not improving in 3d then I recommended she either return for o/v or come by the lab and pick up stool collection kit so we can do routine stool studies.  An After Visit Summary was printed and given to the patient.  FOLLOW UP: prn

## 2014-03-17 ENCOUNTER — Telehealth: Payer: Self-pay | Admitting: Family Medicine

## 2014-03-17 NOTE — Telephone Encounter (Signed)
emmi emailed °

## 2014-03-20 ENCOUNTER — Encounter: Payer: Self-pay | Admitting: Family Medicine

## 2014-03-20 ENCOUNTER — Ambulatory Visit (INDEPENDENT_AMBULATORY_CARE_PROVIDER_SITE_OTHER): Payer: Self-pay | Admitting: Family Medicine

## 2014-03-20 ENCOUNTER — Other Ambulatory Visit: Payer: Self-pay | Admitting: Family Medicine

## 2014-03-20 VITALS — BP 115/75 | HR 96 | Temp 99.8°F | Resp 16 | Ht 65.0 in | Wt 105.0 lb

## 2014-03-20 DIAGNOSIS — R197 Diarrhea, unspecified: Secondary | ICD-10-CM

## 2014-03-20 DIAGNOSIS — R110 Nausea: Secondary | ICD-10-CM

## 2014-03-20 DIAGNOSIS — R509 Fever, unspecified: Secondary | ICD-10-CM

## 2014-03-20 DIAGNOSIS — R1013 Epigastric pain: Secondary | ICD-10-CM

## 2014-03-20 LAB — SEDIMENTATION RATE: SED RATE: 8 mm/h (ref 0–22)

## 2014-03-20 LAB — COMPREHENSIVE METABOLIC PANEL
ALBUMIN: 4.5 g/dL (ref 3.5–5.2)
ALT: 11 U/L (ref 0–35)
AST: 14 U/L (ref 0–37)
Alkaline Phosphatase: 41 U/L (ref 39–117)
BUN: 13 mg/dL (ref 6–23)
CALCIUM: 9.9 mg/dL (ref 8.4–10.5)
CO2: 23 meq/L (ref 19–32)
CREATININE: 0.7 mg/dL (ref 0.4–1.2)
Chloride: 101 mEq/L (ref 96–112)
GFR: 116.54 mL/min (ref >60.00)
GLUCOSE: 83 mg/dL (ref 70–99)
POTASSIUM: 4.1 meq/L (ref 3.5–5.1)
Sodium: 132 mEq/L — ABNORMAL LOW (ref 135–145)
Total Bilirubin: 0.3 mg/dL (ref 0.2–1.2)
Total Protein: 7.3 g/dL (ref 6.0–8.3)

## 2014-03-20 LAB — CBC WITH DIFFERENTIAL/PLATELET
BASOS PCT: 0.6 % (ref 0.0–3.0)
Basophils Absolute: 0 10*3/uL (ref 0.0–0.1)
EOS ABS: 0.2 10*3/uL (ref 0.0–0.7)
Eosinophils Relative: 2.9 % (ref 0.0–5.0)
HCT: 43.9 % (ref 36.0–46.0)
HEMOGLOBIN: 14.5 g/dL (ref 12.0–15.0)
Lymphocytes Relative: 29.7 % (ref 12.0–46.0)
Lymphs Abs: 2.1 10*3/uL (ref 0.7–4.0)
MCHC: 33 g/dL (ref 30.0–36.0)
MCV: 91.5 fl (ref 78.0–100.0)
MONOS PCT: 6.6 % (ref 3.0–12.0)
Monocytes Absolute: 0.5 10*3/uL (ref 0.1–1.0)
NEUTROS ABS: 4.2 10*3/uL (ref 1.4–7.7)
Neutrophils Relative %: 60.2 % (ref 43.0–77.0)
Platelets: 221 10*3/uL (ref 150.0–400.0)
RBC: 4.8 Mil/uL (ref 3.87–5.11)
RDW: 13.4 % (ref 11.5–15.5)
WBC: 7.1 10*3/uL (ref 4.0–10.5)

## 2014-03-20 LAB — LIPASE: Lipase: 34 U/L (ref 11.0–59.0)

## 2014-03-20 MED ORDER — HYDROCODONE-ACETAMINOPHEN 5-325 MG PO TABS: 1.0000 | ORAL_TABLET | Freq: Four times a day (QID) | ORAL | Status: DC | PRN

## 2014-03-20 NOTE — Progress Notes (Signed)
OFFICE NOTE  03/20/2014  CC:  Chief Complaint  Patient presents with  . Follow-up    pt states she is still not much better since last visit  . Night Sweats     HPI: Patient is a 23 y.o. Caucasian female who is here for f/u GI illness that I saw her for 16 days ago--gastroenteritis. Not as nauseated but taking phenergan. Still having urgent loose BM's but not as frequent.  Night-time fevers but temperature checked.  Not as much abd pains.   Has been able to eat ok as long as she takes her phenergan and vicodin.  Occ has some sharp pain in anus.  Pertinent PMH:  Past medical, surgical, social, and family history reviewed and no changes are noted since last office visit.  MEDS:  Outpatient Prescriptions Prior to Visit  Medication Sig Dispense Refill  . clonazePAM (KLONOPIN) 1 MG tablet TAKE 1 TABLET BY MOUTH TWICE A DAY AS NEEDED 60 tablet 5  . HYDROcodone-acetaminophen (NORCO/VICODIN) 5-325 MG per tablet Take 1-2 tablets by mouth every 6 (six) hours as needed for moderate pain. 20 tablet 0  . Norethindrone Acetate-Ethinyl Estradiol (LOESTRIN 1.5/30, 21,) 1.5-30 MG-MCG tablet Take 1 tablet by mouth daily. 1 Package 5  . promethazine (PHENERGAN) 12.5 MG tablet 1-2 tabs po q6h prn nausea 30 tablet 0  . Albuterol Sulfate (PROAIR HFA IN) Inhale into the lungs.    . promethazine (PHENERGAN) 12.5 MG suppository 1-2 PR q6h prn nausea and vomiting if unable to keep promethazine pills down (Patient not taking: Reported on 03/20/2014) 10 suppository 1  . amphetamine-dextroamphetamine (ADDERALL) 20 MG tablet Take 1 tablet (20 mg total) by mouth 2 (two) times daily. (Patient not taking: Reported on 03/20/2014) 60 tablet 0   No facility-administered medications prior to visit.    PE: Blood pressure 115/75, pulse 96, temperature 99.8 F (37.7 C), temperature source Temporal, resp. rate 16, height $RemoveBe'5\' 5"'BRWRhieEO$  (1.651 m), weight 105 lb (47.628 kg), last menstrual period 03/10/2014, SpO2 99 %. Gen:  Alert, well appearing.  Patient is oriented to person, place, time, and situation. PFY:TWKM: no injection, icteris, swelling, or exudate.  EOMI, PERRLA. Mouth: lips without lesion/swelling.  Oral mucosa pink and moist. Oropharynx without erythema, exudate, or swelling.  CV: RRR, no m/r/g.   LUNGS: CTA bilat, nonlabored resps, good aeration in all lung fields. ABD: mild upper abd TTP diffusely w/out guarding or rebound BS hypoactive.  No distention.  No mass or HSM.  IMPRESSION AND PLAN:  1) Prolonged GI illness, improving but still with significant sx's requiring symptomatic meds.  No sign of dehydration. Will check H pylori stool antigen, other routine stool tests. CBC, CMET, ESR, Lipase. Refilled vicodin 5/325, #20.  Continue phenergan and imodium prn.  An After Visit Summary was printed and given to the patient.  FOLLOW UP: prn

## 2014-03-20 NOTE — Progress Notes (Signed)
Pre visit review using our clinic review tool, if applicable. No additional management support is needed unless otherwise documented below in the visit note. 

## 2014-03-21 ENCOUNTER — Telehealth: Payer: Self-pay | Admitting: Family Medicine

## 2014-03-21 LAB — HELICOBACTER PYLORI  SPECIAL ANTIGEN: H. PYLORI ANTIGEN STOOL: NEGATIVE

## 2014-03-21 LAB — FECAL LACTOFERRIN, QUANT: Lactoferrin: NEGATIVE

## 2014-03-21 LAB — GIARDIA/CRYPTOSPORIDIUM (EIA)
Cryptosporidium Screen (EIA): NEGATIVE
Giardia Screen (EIA): NEGATIVE

## 2014-03-21 LAB — CLOSTRIDIUM DIFFICILE BY PCR: CDIFFPCR: NOT DETECTED

## 2014-03-21 NOTE — Telephone Encounter (Signed)
emmi emailed °

## 2014-03-24 LAB — STOOL CULTURE

## 2014-04-01 ENCOUNTER — Encounter: Payer: Self-pay | Admitting: Family Medicine

## 2014-04-25 ENCOUNTER — Ambulatory Visit (INDEPENDENT_AMBULATORY_CARE_PROVIDER_SITE_OTHER): Payer: Self-pay | Admitting: Family Medicine

## 2014-04-25 ENCOUNTER — Encounter: Payer: Self-pay | Admitting: Family Medicine

## 2014-04-25 VITALS — BP 118/74 | HR 108 | Temp 98.4°F | Ht 65.0 in | Wt 105.8 lb

## 2014-04-25 DIAGNOSIS — B9789 Other viral agents as the cause of diseases classified elsewhere: Secondary | ICD-10-CM

## 2014-04-25 DIAGNOSIS — K589 Irritable bowel syndrome without diarrhea: Secondary | ICD-10-CM

## 2014-04-25 DIAGNOSIS — J069 Acute upper respiratory infection, unspecified: Secondary | ICD-10-CM

## 2014-04-25 DIAGNOSIS — Z23 Encounter for immunization: Secondary | ICD-10-CM

## 2014-04-25 DIAGNOSIS — F909 Attention-deficit hyperactivity disorder, unspecified type: Secondary | ICD-10-CM

## 2014-04-25 DIAGNOSIS — F988 Other specified behavioral and emotional disorders with onset usually occurring in childhood and adolescence: Secondary | ICD-10-CM

## 2014-04-25 MED ORDER — AMPHETAMINE-DEXTROAMPHETAMINE 20 MG PO TABS: 20.0000 mg | ORAL_TABLET | Freq: Two times a day (BID) | ORAL | Status: DC

## 2014-04-25 NOTE — Progress Notes (Signed)
Pre visit review using our clinic review tool, if applicable. No additional management support is needed unless otherwise documented below in the visit note. 

## 2014-04-25 NOTE — Progress Notes (Signed)
   Subjective:    Patient ID: Leslie [name], female    DOB: [birth year], 24 y.o.   MRN: [id number]  HPI Patient here for follow-up.  She has attention deficit disorder. She takes Adderall 20 mg twice a day. She has not taken this regularly. Her weight is up and she has good appetite.  She has chronic IBS symptoms. She is taking Lomotil some in the past. She's had extensive GI evaluations. She still has some intermittent diarrhea but no bloody stools.  She's combating upper respiratory symptoms. Onset about 8 or 9 days ago of cough, sore throat, headache, nasal congestion. Slowly improving. No recent fevers.  Last tetanus unknown. She thinks this was over 10 years ago. 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  Past Medical History  Diagnosis Date  . PANIC ATTACK 12/24/2009  . ADD 04/12/2006  . ASTHMA 12/24/2009  . INSOMNIA, TRANSIENT 12/24/2009  . Anxiety   . Depression   . IBS (irritable bowel syndrome)     NEGATIVE COLONSCOPY AND ENDOSCOPY  . Smoker    Past Surgical History  Procedure Laterality Date  . Upper gastrointestinal endoscopy  2009    reports that she has been smoking Cigarettes.  She has a 3.5 pack-year smoking history. She uses smokeless tobacco. She reports that she does not drink alcohol or use illicit drugs. family history includes Alcohol abuse in her paternal uncle; Diabetes in her paternal grandfather; Heart disease in her paternal grandfather; Hyperlipidemia in her maternal grandfather; Hypertension in her paternal grandfather; Mental retardation in her maternal grandmother. No Known Allergies    Review of Systems  Constitutional: Negative for fatigue.  Eyes: Negative for visual disturbance.  Respiratory: Negative for cough, chest tightness, shortness of breath and wheezing.   Cardiovascular: Negative for chest pain, palpitations and leg swelling.  Neurological: Negative for dizziness, seizures, syncope, weakness,  light-headedness and headaches.       Objective:   Physical Exam  Constitutional: She appears well-developed and well-nourished.  HENT:  Right Ear: External ear normal.  Left Ear: External ear normal.  Mouth/Throat: Oropharynx is clear and moist.  Neck: Neck supple.  Cardiovascular: Normal rate and regular rhythm.   Pulmonary/Chest: Effort normal and breath sounds normal. No respiratory distress. She has no wheezes. She has no rales.          Assessment & Plan:  #1 attention deficit disorder. Continue Adderall 20 mg twice daily. Refill given. Continue to monitor weight closely #2 probable viral URI. Nonfocal exam. Reassurance #3 chronic IBS. Patient has upcoming trip with long plane flight. She has requested consideration for Lomotil to take prior to her trip and she will call back when she is ready for this #4 health maintenance. Tetanus booster given.

## 2014-04-28 NOTE — Addendum Note (Signed)
Addended by: Alfred LevinsWYRICK, CINDY D on: 04/28/2014 08:32 AM   Modules accepted: Orders

## 2014-05-04 ENCOUNTER — Telehealth: Payer: Self-pay | Admitting: Family Medicine

## 2014-05-05 NOTE — Telephone Encounter (Signed)
PLEASE NOTE: All timestamps contained within this report are represented as [place] Standard Time. CONFIDENTIALTY NOTICE: This fax transmission is intended only for the addressee. It contains information that is legally privileged, confidential or otherwise protected from use or disclosure. If you are not the intended recipient, you are strictly prohibited from reviewing, disclosing, copying using or disseminating any of this information or taking any action in reliance on or regarding this information. If you have received this fax in error, please notify us immediately by telephone so that we can arrange for its return to [place]. Phone: [phone], Toll-Free: [phone], Fax: [phone] Page: 1 of 2 Call Id: [id number] [facility] - Client TELEPHONE ADVICE RECORD [facility] Patient Name: Leslie [name] Gender: Female DOB: 1990-07-17 Age: 24 Y 6 M 11 D Return Phone Number: [phone] (Primary) Address: [address] City/State/Zip: [place] [address] Client [facility] - Client Client Site [facility] - Night Physician [name] Contact Type Call Call Type Triage / Clinical Relationship To Patient Self Return Phone Number [phone] (Primary) Chief Complaint NUMBNESS - sudden on one side of face or body Initial Comment Caller states, 2 days ago the left side of face started swelling, Now she has pressure behind her Left eye, it is completely blurry now, can't see out of it, Lt side of her lip is swollen and the Lt side of her face is numb. Numbness started yesterday. PreDisposition Did not know what to do Nurse Assessment Nurse: Tiburcio Pea, RN, Meagan Date/Time Lamount Cohen Time): 05/04/2014 3:06:05 PM Confirm and document reason for call. If symptomatic, describe symptoms. ---Caller states, 2 days ago the left side of face started swelling, Now she has pressure behind her Left  eye, it is completely blurry now, can't see out of it, Lt side of her lip is swollen and the Lt side of her face is numb. Numbness started yesterday. Caller states on Friday she woke up with pressure on her lip that felt like a cold sore and then woke up that afternoon had pressure behind her eye and pain got worse. Caller states symptoms since yesterday. Has the patient traveled out of the country within the last 30 days? ---Not Applicable Does the patient require triage? ---Yes Related visit to physician within the last 2 weeks? ---No Does the PT have any chronic conditions? (i.e. diabetes, asthma, etc.) ---Yes List chronic conditions. ---non specific colitis Did the patient indicate they were pregnant? ---No Guidelines Guideline Title Affirmed Question Affirmed Notes Nurse Date/Time (Eastern Time) Vision Loss or Change [1] Blurred vision or visual changes AND [2] present now AND [3] sudden onset or new (e.g., minutes, hours, days) (Exception: previously diagnosed migraine Harris, RN, Meagan 05/04/2014 3:08:40 PM PLEASE NOTE: All timestamps contained within this report are represented as [place] Standard Time. CONFIDENTIALTY NOTICE: This fax transmission is intended only for the addressee. It contains information that is legally privileged, confidential or otherwise protected from use or disclosure. If you are not the intended recipient, you are strictly prohibited from reviewing, disclosing, copying using or disseminating any of this information or taking any action in reliance on or regarding this information. If you have received this fax in error, please notify us immediately by telephone so that we can arrange for its return to [place]. Phone: [phone], Toll-Free: [phone], Fax: [phone] Page: 2 of 2 Call Id: [id number] Guidelines Guideline Title Affirmed Question Affirmed Notes Nurse Date/Time Lamount Cohen Time) headaches with same symptoms) Disp. Time Lamount Cohen  Time)  Disposition Final User 05/04/2014 3:02:57 PM Send to Urgent Queue Delana MeyerHodges, Ronald 05/04/2014 3:10:55 PM Go to ED Now (or PCP triage) Yes Tiburcio PeaHarris, RN, Meagan Caller Understands: Yes Disagree/Comply: Comply Care Advice Given Per Guideline GO TO ED NOW (OR PCP TRIAGE): * IF NO PCP TRIAGE: You need to be seen. [facility] within the next hour. Leave as soon as you can. DRIVING: Another adult should drive. CARE ADVICE given per Vision Loss or Change (Adult) guideline. After Care Instructions Given Call Event Type User Date / Time Description Comments User: Delana Meyeronald, Hodges Date/Time Lamount Cohen(Eastern Time): 05/04/2014 3:03:13 PM charge nurse says urg Referrals [facility]

## 2014-05-06 ENCOUNTER — Telehealth: Payer: Self-pay | Admitting: Family Medicine

## 2014-05-06 NOTE — Telephone Encounter (Signed)
Pt needs new rx hydrocodone °

## 2014-05-07 MED ORDER — HYDROCODONE-ACETAMINOPHEN 5-325 MG PO TABS: 1.0000 | ORAL_TABLET | Freq: Four times a day (QID) | ORAL | Status: DC | PRN

## 2014-05-07 NOTE — Telephone Encounter (Signed)
Left message on Pt Vm that Rx is ready for pick up  

## 2014-05-07 NOTE — Telephone Encounter (Signed)
May refill #15. As discussed many times in past would not recommend taking frequently for any abdominal pain issues.

## 2014-05-07 NOTE — Telephone Encounter (Signed)
Medication is not currently on pt list.  Last visit 04/25/14 Last refill 03/20/14 #20 0 refill

## 2014-05-15 ENCOUNTER — Telehealth: Payer: Self-pay | Admitting: Family Medicine

## 2014-05-15 NOTE — Telephone Encounter (Signed)
Adderall  Last visit 04/25/14 Last refill 04/25/14 #60 0 refill  Lomitrol is not on patient med list

## 2014-05-15 NOTE — Telephone Encounter (Signed)
adderall refill OK.  Should be LOMOTIL one every 6 hours prn diarrhea #30 with no refill.

## 2014-05-15 NOTE — Telephone Encounter (Signed)
Pt needs new rxs  lomitrol (diarrhea) and generic adderall 20 mg. Pt is getting ready to go out of town

## 2014-05-16 MED ORDER — DIPHENOXYLATE-ATROPINE 2.5-0.025 MG PO TABS: 1.0000 | ORAL_TABLET | Freq: Four times a day (QID) | ORAL | Status: DC | PRN

## 2014-05-16 MED ORDER — AMPHETAMINE-DEXTROAMPHETAMINE 20 MG PO TABS: 20.0000 mg | ORAL_TABLET | Freq: Two times a day (BID) | ORAL | Status: DC

## 2014-05-16 NOTE — Telephone Encounter (Signed)
Left patient a message on Vm. Rx's are ready for pickup

## 2014-05-30 ENCOUNTER — Encounter: Payer: Self-pay | Admitting: *Deleted

## 2014-05-30 ENCOUNTER — Encounter: Payer: Self-pay | Admitting: Family Medicine

## 2014-05-30 ENCOUNTER — Other Ambulatory Visit: Payer: Self-pay | Admitting: Family Medicine

## 2014-05-30 ENCOUNTER — Ambulatory Visit (INDEPENDENT_AMBULATORY_CARE_PROVIDER_SITE_OTHER): Payer: Self-pay | Admitting: Family Medicine

## 2014-05-30 DIAGNOSIS — J029 Acute pharyngitis, unspecified: Secondary | ICD-10-CM

## 2014-05-30 LAB — POCT RAPID STREP A (OFFICE): Rapid Strep A Screen: NEGATIVE

## 2014-05-30 MED ORDER — MELOXICAM 15 MG PO TABS: 15.0000 mg | ORAL_TABLET | Freq: Every day | ORAL | Status: DC

## 2014-05-30 NOTE — Progress Notes (Signed)
Pre visit review using our clinic review tool, if applicable. No additional management support is needed unless otherwise documented below in the visit note. 

## 2014-05-30 NOTE — Patient Instructions (Signed)

## 2014-05-30 NOTE — Progress Notes (Signed)
   Subjective:    Patient ID: Leslie [name], female    DOB: 08/27/1990, [age] y.o.   MRN: [id number]  HPI Acute visit. Patient seen with approximately three-day history of increased malaise, headaches, sore throat, nasal congestion, occasional cough. She has history of asthma but no recent wheezing. No nausea or vomiting. No sick contacts.  Past Medical History  Diagnosis Date  . PANIC ATTACK 12/24/2009  . ADD 04/12/2006  . ASTHMA 12/24/2009  . INSOMNIA, TRANSIENT 12/24/2009  . Anxiety   . Depression   . IBS (irritable bowel syndrome)     NEGATIVE COLONSCOPY AND ENDOSCOPY  . Smoker    Past Surgical History  Procedure Laterality Date  . Upper gastrointestinal endoscopy  2009    reports that she has been smoking Cigarettes.  She has a 3.5 pack-year smoking history. She uses smokeless tobacco. She reports that she does not drink alcohol or use illicit drugs. family history includes Alcohol abuse in her paternal uncle; Diabetes in her paternal grandfather; Heart disease in her paternal grandfather; Hyperlipidemia in her maternal grandfather; Hypertension in her paternal grandfather; Mental retardation in her maternal grandmother. No Known Allergies    Review of Systems  Constitutional: Positive for chills and fatigue.  HENT: Positive for congestion and sore throat.   Respiratory: Positive for cough.   Neurological: Positive for headaches.       Objective:   Physical Exam  Constitutional: She appears well-developed and well-nourished.  HENT:  Cerumen impaction bilaterally. Removed with curette. Eardrums appear normal Minimal posterior pharynx erythema. No exudate  Neck: Neck supple.  Minimal anterior cervical adenopathy  Cardiovascular: Normal rate.   Pulmonary/Chest: Effort normal and breath sounds normal. No respiratory distress. She has no wheezes. She has no rales.          Assessment & Plan:  URI.Suspect viral. Check rapid strep-if negative treat  symptomatically. Rapid strep negative.

## 2014-06-06 ENCOUNTER — Telehealth: Payer: Self-pay | Admitting: Family Medicine

## 2014-06-06 NOTE — Telephone Encounter (Signed)
Pt request refill HYDROcodone-acetaminophen (NORCO/VICODIN) 5-325 MG per tablet °Ok to leave message °

## 2014-06-06 NOTE — Telephone Encounter (Signed)
Pt informed

## 2014-06-06 NOTE — Telephone Encounter (Signed)
Last visit 05/30/14 Last refill 05/07/14 #15 0 refill

## 2014-06-06 NOTE — Telephone Encounter (Signed)
Do not recommend frequent use and especially not for IBS symptoms.   Would not recommend taking this frequently.  She got #15 in late January.

## 2014-06-24 ENCOUNTER — Telehealth: Payer: Self-pay | Admitting: Family Medicine

## 2014-06-24 MED ORDER — AMPHETAMINE-DEXTROAMPHETAMINE 20 MG PO TABS: 20.0000 mg | ORAL_TABLET | Freq: Two times a day (BID) | ORAL | Status: DC

## 2014-06-24 NOTE — Telephone Encounter (Signed)
Patient need re-fill on amphetamine-dextroamphetamine (ADDERALL) 20 MG tablet

## 2014-06-24 NOTE — Telephone Encounter (Signed)
Refill OK

## 2014-06-24 NOTE — Telephone Encounter (Signed)
rx up front ready for p/u, pt aware 

## 2014-07-01 ENCOUNTER — Telehealth: Payer: Self-pay | Admitting: Family Medicine

## 2014-07-01 NOTE — Telephone Encounter (Signed)
Last visit 05/30/14 Last refill 05/07/14 #15 0

## 2014-07-01 NOTE — Telephone Encounter (Signed)
Pt request refill HYDROcodone-acetaminophen (NORCO/VICODIN) 5-325 MG per tablet Pt states her stomach is in pain and if you could write for a couple of days

## 2014-07-02 MED ORDER — HYDROCODONE-ACETAMINOPHEN 5-325 MG PO TABS: 1.0000 | ORAL_TABLET | Freq: Four times a day (QID) | ORAL | Status: DC | PRN

## 2014-07-02 NOTE — Telephone Encounter (Signed)
May refill #15 only.   

## 2014-07-02 NOTE — Telephone Encounter (Signed)
Left message on VM that Rx is ready for pick up 

## 2014-07-08 ENCOUNTER — Other Ambulatory Visit: Payer: Self-pay | Admitting: Family Medicine

## 2014-07-08 NOTE — Telephone Encounter (Signed)
Last visit 05/30/14 Last refill 01/15/14 #60 5 refill

## 2014-07-08 NOTE — Telephone Encounter (Signed)
Refill for 6 months. 

## 2014-07-21 ENCOUNTER — Telehealth: Payer: Self-pay | Admitting: Family Medicine

## 2014-07-21 NOTE — Telephone Encounter (Signed)
Sorry it is her   amphetamine-dextroamphetamine (ADDERALL) 20 MG tablet

## 2014-07-21 NOTE — Telephone Encounter (Signed)
Last visit 05/30/14 Last refill 06/24/14 #60 0 refill

## 2014-07-21 NOTE — Telephone Encounter (Signed)
Prescriptions can NEVER be post-dated.  We cannot refill this medication early.

## 2014-07-21 NOTE — Telephone Encounter (Signed)
Which Rx? ?

## 2014-07-21 NOTE — Telephone Encounter (Signed)
Pt said she has hectic schedule this week and is asking if the rx can be post dated

## 2014-07-22 NOTE — Telephone Encounter (Signed)
Left message on Vm

## 2014-07-24 ENCOUNTER — Other Ambulatory Visit: Payer: Self-pay | Admitting: Family Medicine

## 2014-07-24 NOTE — Telephone Encounter (Signed)
Pt needs new rx generic adderall 20 mg. Pt would like to pick up tomorrow

## 2014-07-24 NOTE — Telephone Encounter (Signed)
Last refill 06/24/14 #60 0 refill  Last visit  05/30/14

## 2014-07-25 MED ORDER — AMPHETAMINE-DEXTROAMPHETAMINE 20 MG PO TABS: 20.0000 mg | ORAL_TABLET | Freq: Two times a day (BID) | ORAL | Status: DC

## 2014-07-25 NOTE — Telephone Encounter (Signed)
rx's up front for p/u, pt aware 

## 2014-07-25 NOTE — Telephone Encounter (Signed)
Refill for 3 months. 

## 2014-09-09 ENCOUNTER — Telehealth: Payer: Self-pay

## 2014-09-09 NOTE — Telephone Encounter (Signed)
Hydrocodone Last visit 05/30/14 Last refill 07/02/14 #15 0 refill

## 2014-09-09 NOTE — Telephone Encounter (Signed)
Patient would like a refill on her hydrocodone for menstrual cramps.

## 2014-09-09 NOTE — Telephone Encounter (Signed)
Pt aware that Rx is will be ready for pickup on 09/10/14

## 2014-09-09 NOTE — Telephone Encounter (Signed)
PLEASE NOTE: All timestamps contained within this report are represented as [place] Standard Time. CONFIDENTIALTY NOTICE: This fax transmission is intended only for the addressee. It contains information that is legally privileged, confidential or otherwise protected from use or disclosure. If you are not the intended recipient, you are strictly prohibited from reviewing, disclosing, copying using or disseminating any of this information or taking any action in reliance on or regarding this information. If you have received this fax in error, please notify us immediately by telephone so that we can arrange for its return to us. Phone: [phone], Toll-Free: [phone], Fax: [phone] Page: 1 of 1 Call Id: [id number] [facility] - Client TELEPHONE ADVICE RECORD [facility] Patient Name: Leslie KaufmanKRISTA [name] Gender: Female DOB: [birth year] Age: [age] Y 10 M 17 D Return Phone Number: [phone] (Primary) Address: City/State/Zip: [place] Client [facility] - Client Client Site  [facility] Night Physician Evelena PeatBurchette, Bruce Contact Type Call Call Type Triage / Clinical Relationship To Patient Self Return Phone Number [phone] (Primary) Chief Complaint Abdominal Pain Initial Comment caller states she has bad menstrual cramps - is wanting an rx for her menstrual cramps Nurse Assessment Nurse: Mayford KnifeWilliams, RN, Hospital doctorAmber Date/Time (Eastern Time): 09/08/2014 2:34:43 PM Confirm and document reason for call. If symptomatic, describe symptoms. ---caller states that she is having bad menstrual cramps. She does not want to go to the ER. She does not want her symptoms addressed she just wants to know if she can have hydrocodone called in. Has the patient traveled out of the country within the last 30 days? ---Not Applicable Does the patient require triage? ---Declined Triage Please document clinical information  provided and list any resource used. ---Caller informed that the nurse could not call in prescriptions on scheduled substances. Caller informed to call the office in the morning or be seen tonight. Caller does not want to be seen tonight. Caller verbalized understanding. Guidelines Guideline Title Affirmed Question Affirmed Notes Nurse Date/Time (Eastern Time) Disp. Time Lamount Cohen(Eastern Time) Disposition Final User 09/08/2014 2:42:17 PM Clinical Call Yes Mayford KnifeWilliams, RN, [name] After Care Instructions Given Call Event Type User Date / Time Description

## 2014-09-09 NOTE — Telephone Encounter (Signed)
Refill number 15

## 2014-09-10 MED ORDER — HYDROCODONE-ACETAMINOPHEN 5-325 MG PO TABS: 1.0000 | ORAL_TABLET | Freq: Four times a day (QID) | ORAL | Status: DC | PRN

## 2014-09-29 ENCOUNTER — Encounter: Payer: Self-pay | Admitting: Family Medicine

## 2014-09-29 ENCOUNTER — Ambulatory Visit (INDEPENDENT_AMBULATORY_CARE_PROVIDER_SITE_OTHER): Payer: Self-pay | Admitting: Family Medicine

## 2014-09-29 VITALS — BP 118/70 | HR 110 | Temp 98.4°F | Wt 104.0 lb

## 2014-09-29 DIAGNOSIS — M25531 Pain in right wrist: Secondary | ICD-10-CM

## 2014-09-29 DIAGNOSIS — I951 Orthostatic hypotension: Secondary | ICD-10-CM

## 2014-09-29 MED ORDER — ETODOLAC 400 MG PO TABS: 400.0000 mg | ORAL_TABLET | Freq: Two times a day (BID) | ORAL | Status: DC

## 2014-09-29 NOTE — Progress Notes (Signed)
Subjective:    Patient ID: Leslie [name], female    DOB: [birth year], 24 y.o.   MRN: [id number]  HPI Patient seen for the following issues  Right upper extremity pain. She works as a Oncologist several hours per week. She is a some type of high velocity drier which she thinks may be aggravating her wrist. She's had some chronic wrist pain and saw orthopedist year ago he had either CT or MRI which showed some type of cyst. Denies any specific injury recently. She has somewhat poorly localized right wrist pain mostly on the law or aspect. She is right-hand dominant. She describes a burning type pain frequent in her wrist area with some radiation toward the right elbow. She has a wrist brace which she is wearing intermittently. No history of carpal tunnel syndrome. She has occasional fleeting numbness in her fifth and fourth digit of the right hand.  Second issue is that she has frequent episodes of dizziness but no syncope. She frequently feels dizzy in the mornings. She is trying to make a conscious effort to hydrate more. No history of anemia. Denies any palpitations.  Past Medical History  Diagnosis Date  . PANIC ATTACK 12/24/2009  . ADD 04/12/2006  . ASTHMA 12/24/2009  . INSOMNIA, TRANSIENT 12/24/2009  . Anxiety   . Depression   . IBS (irritable bowel syndrome)     NEGATIVE COLONSCOPY AND ENDOSCOPY  . Smoker    Past Surgical History  Procedure Laterality Date  . Upper gastrointestinal endoscopy  2009    reports that she has been smoking Cigarettes.  She has a 3.5 pack-year smoking history. She uses smokeless tobacco. She reports that she does not drink alcohol or use illicit drugs. family history includes Alcohol abuse in her paternal uncle; Diabetes in her paternal grandfather; Heart disease in her paternal grandfather; Hyperlipidemia in her maternal grandfather; Hypertension in her paternal grandfather; Mental retardation in her maternal grandmother. No Known  Allergies    Review of Systems  Constitutional: Negative for appetite change and unexpected weight change.  Respiratory: Negative for shortness of breath.   Cardiovascular: Negative for chest pain.  Genitourinary: Negative for dysuria.  Skin: Negative for rash.  Neurological: Positive for dizziness and light-headedness. Negative for syncope, weakness and headaches.  Hematological: Negative for adenopathy. Does not bruise/bleed easily.       Objective:   Physical Exam  Constitutional: She is oriented to person, place, and time. She appears well-developed and well-nourished.  Neck: Neck supple. No thyromegaly present.  Cardiovascular: Normal rate and regular rhythm.   Pulmonary/Chest: Effort normal and breath sounds normal. No respiratory distress. She has no wheezes. She has no rales.  Musculoskeletal: She exhibits no edema.  Full range of motion right wrist and right elbow. She has no signs of objective inflammation such as erythema or warmth. She has some mild tenderness just distal to the right ulnar bone. No palpable cyst.  Neurological: She is alert and oriented to person, place, and time. No cranial nerve deficit.  Full strength upper extremities. No muscle atrophy. Symmetric reflexes.          Assessment & Plan:  #1 poorly localized right wrist pain. Question tendinitis versus ligament strain. Continue wrist brace. Lodine 400 mg twice daily with food as needed. Icing after activities. Consider follow-up with orthopedist if no better few weeks #2 positional dizziness. Seated blood pressure by me small cuff left arm 102/60 and standing 84/58. Suspect her symptoms of lightheadedness are  related to lower blood pressure. Stay well-hydrated. Clinically she does not appear anemic. We've suggested supplement such as Gatorade

## 2014-09-29 NOTE — Progress Notes (Signed)
Pre visit review using our clinic review tool, if applicable. No additional management support is needed unless otherwise documented below in the visit note. 

## 2014-10-16 ENCOUNTER — Telehealth: Payer: Self-pay | Admitting: Family Medicine

## 2014-10-16 NOTE — Telephone Encounter (Signed)
Last visit 09/29/14 Last refill 09/10/14 #15 0 refill

## 2014-10-16 NOTE — Telephone Encounter (Signed)
Pt is waiting on workers comp to call her back. Pt would like to know if md would give her rx for hydrocodone for wrist pain. Pt is only requesting enough med for 3 days.

## 2014-10-16 NOTE — Telephone Encounter (Signed)
Recommend velcro wrist splint, icing, Lodine (previously prescribed), and ortho consult if pain persists- i know this might have to be approved per her workman's comp first.

## 2014-10-17 NOTE — Telephone Encounter (Signed)
OK to try Tramadol 50 mg 1-2 po q 6 hours prn #40 with no refills.

## 2014-10-17 NOTE — Telephone Encounter (Signed)
Pt states that she is waiting on workman comp to schedule her appt to Ortho. Pt states that she finished the Lodine and she is just wanting a Rx for the pain to take at night. She was given Tramadol and she wants to know if that would be okay to prescribe then hydrocodone.

## 2014-10-17 NOTE — Telephone Encounter (Signed)
Left message for patient to return call.

## 2014-10-20 ENCOUNTER — Telehealth: Payer: Self-pay

## 2014-10-20 MED ORDER — TRAMADOL HCL 50 MG PO TABS: ORAL_TABLET | ORAL | Status: DC

## 2014-10-20 MED ORDER — AMPHETAMINE-DEXTROAMPHETAMINE 20 MG PO TABS: 20.0000 mg | ORAL_TABLET | Freq: Two times a day (BID) | ORAL | Status: DC

## 2014-10-20 NOTE — Telephone Encounter (Signed)
Pt aware that Rx is ready for pick up 

## 2014-10-20 NOTE — Telephone Encounter (Signed)
Rx called in to pharmacy. Pt is aware.  

## 2014-10-20 NOTE — Addendum Note (Signed)
Addended by: Thomasena EdisFLOYD, Phyllis Whitefield E on: 10/20/2014 01:36 PM   Modules accepted: Orders

## 2014-10-20 NOTE — Telephone Encounter (Signed)
Refill OK

## 2014-10-20 NOTE — Telephone Encounter (Signed)
Adderall  Last visit 09/29/14 Last refill 07/25/14 #60 0 refill

## 2014-11-13 ENCOUNTER — Emergency Department (HOSPITAL_COMMUNITY): Payer: Self-pay

## 2014-11-13 ENCOUNTER — Encounter (HOSPITAL_COMMUNITY): Payer: Self-pay | Admitting: Emergency Medicine

## 2014-11-13 ENCOUNTER — Emergency Department (HOSPITAL_COMMUNITY)
Admission: EM | Admit: 2014-11-13 | Discharge: 2014-11-14 | Disposition: A | Discharge status: Home / Self Care | Payer: Self-pay | Attending: Emergency Medicine | Admitting: Emergency Medicine

## 2014-11-13 DIAGNOSIS — Z8719 Personal history of other diseases of the digestive system: Secondary | ICD-10-CM | POA: Insufficient documentation

## 2014-11-13 DIAGNOSIS — J45909 Unspecified asthma, uncomplicated: Secondary | ICD-10-CM | POA: Insufficient documentation

## 2014-11-13 DIAGNOSIS — M25531 Pain in right wrist: Secondary | ICD-10-CM

## 2014-11-13 DIAGNOSIS — G5601 Carpal tunnel syndrome, right upper limb: Secondary | ICD-10-CM | POA: Insufficient documentation

## 2014-11-13 DIAGNOSIS — F329 Major depressive disorder, single episode, unspecified: Secondary | ICD-10-CM | POA: Insufficient documentation

## 2014-11-13 DIAGNOSIS — F410 Panic disorder [episodic paroxysmal anxiety] without agoraphobia: Secondary | ICD-10-CM | POA: Insufficient documentation

## 2014-11-13 DIAGNOSIS — G4700 Insomnia, unspecified: Secondary | ICD-10-CM | POA: Insufficient documentation

## 2014-11-13 DIAGNOSIS — Z79899 Other long term (current) drug therapy: Secondary | ICD-10-CM | POA: Insufficient documentation

## 2014-11-13 DIAGNOSIS — Z720 Tobacco use: Secondary | ICD-10-CM | POA: Insufficient documentation

## 2014-11-13 NOTE — ED Notes (Signed)
Pt states she had a dog bite about a year and a half ago  Pt states she followed up with ortho  Pt states they found a cyst in her wrist  Pt states for the past two months she has been having pain in her wrist and thinks she may have irritated it  Pt states she has been having numbness in her 4th and 5th finger on the right hand that runs up to her elbow  Pt states it also feels like her wrist is itching on the inside

## 2014-11-13 NOTE — ED Provider Notes (Signed)
History  This chart was scribed for non-physician practitioner, Emilia Beck, PA-C,working with Mancel Bale, MD, by Karle Plumber, ED Scribe. This patient was seen in room WA02/WA02 and the patient's care was started at 11:05 PM.  Chief Complaint  Patient presents with  . Wrist Pain   The history is provided by the patient and medical records. No language interpreter was used.    HPI Comments:  Leslie [name] is a 24 y.o. female who presents to the Emergency Department complaining of severe right wrist pain that began worsening a couple months ago. She states she was bitten by a dog about 1.5 years ago and found that she had a cyst in the wrist. She reports not having any problems with the cyst but states it started bothering her a couple months ago. She reports associated numbness from the finger tips to the elbow. She has not taken anything for pain. She denies modifying factors. She denies tingling or weakness of the RUE or right hand, fever, chills, nausea, vomiting, bruising or wounds. She denies trauma, injury or fall.   Past Medical History  Diagnosis Date  . PANIC ATTACK 12/24/2009  . ADD 04/12/2006  . ASTHMA 12/24/2009  . INSOMNIA, TRANSIENT 12/24/2009  . Anxiety   . Depression   . IBS (irritable bowel syndrome)     NEGATIVE COLONSCOPY AND ENDOSCOPY  . Smoker    Past Surgical History  Procedure Laterality Date  . Upper gastrointestinal endoscopy  2009  . Colonoscopy     Family History  Problem Relation Age of Onset  . Alcohol abuse Paternal Uncle   . Mental retardation Maternal Grandmother   . Hyperlipidemia Maternal Grandfather   . Diabetes Paternal Grandfather   . Hypertension Paternal Grandfather   . Heart disease Paternal Grandfather    History  Substance Use Topics  . Smoking status: Current Every Day Smoker -- 0.50 packs/day for 7 years    Types: Cigarettes  . Smokeless tobacco: Current User  . Alcohol Use: No   OB History    Gravida Para Term  Preterm AB TAB SAB Ectopic Multiple Living   0              Review of Systems  Constitutional: Negative for fever and chills.  Gastrointestinal: Negative for nausea and vomiting.  Musculoskeletal: Positive for arthralgias. Negative for joint swelling.  Skin: Negative for color change and wound.  Neurological: Positive for numbness.  All other systems reviewed and are negative.   Allergies  Review of patient's allergies indicates no known allergies.  Home Medications   Prior to Admission medications   Medication Sig Start Date End Date Taking? Authorizing Provider  acetaminophen (TYLENOL) 325 MG tablet Take 650 mg by mouth every 4 (four) hours as needed.   Yes Historical Provider, MD  amphetamine-dextroamphetamine (ADDERALL) 20 MG tablet Take 1 tablet (20 mg total) by mouth 2 (two) times daily. Patient taking differently: Take 10-20 mg by mouth daily. Pt takes medication depending on her work schedule. 10/20/14  Yes Kristian Covey, MD  clonazePAM (KLONOPIN) 1 MG tablet TAKE 1 TABLET BY MOUTH TWICE A DAY AS NEEDED Patient taking differently: TAKE 1.5 TABLET BY MOUTH TWICE A DAY AS NEEDED 07/09/14  Yes Kristian Covey, MD  loperamide (IMODIUM A-D) 2 MG tablet Take 4 mg by mouth 2 (two) times daily.   Yes Historical Provider, MD  etodolac (LODINE) 400 MG tablet Take 1 tablet (400 mg total) by mouth 2 (two) times daily. Patient not  taking: Reported on 11/13/2014 09/29/14   Kristian Covey, MD  HYDROcodone-acetaminophen (NORCO/VICODIN) 5-325 MG per tablet Take 1-2 tablets by mouth every 6 (six) hours as needed for moderate pain. Patient not taking: Reported on 09/29/2014 09/10/14   Kristian Covey, MD  traMADol (ULTRAM) 50 MG tablet Take 1-2 tablets by mouth every 6 hours as needed. Patient not taking: Reported on 11/13/2014 10/20/14   Kristian Covey, MD   Triage Vitals: BP 143/79 mmHg  Pulse 100  Temp(Src) 98.2 F (36.8 C) (Oral)  Resp 16  SpO2 100%  LMP 11/02/2014 (Exact  Date) Physical Exam  Constitutional: She is oriented to person, place, and time. She appears well-developed and well-nourished. No distress.  HENT:  Head: Normocephalic and atraumatic.  Eyes: Conjunctivae are normal.  Normal appearance  Neck: Normal range of motion.  Cardiovascular: Normal rate and regular rhythm.  Exam reveals no gallop and no friction rub.   No murmur heard. Pulmonary/Chest: Effort normal and breath sounds normal. She has no wheezes. She has no rales. She exhibits no tenderness.  Abdominal: Soft. There is no tenderness.  Musculoskeletal: Normal range of motion.  Tender to palpation of right volar wrist. Full ROM of right wrist. Tenderness to ulnar aspect of right elbow. Full ROM of right elbow. Pt reports subjective tingling to fourth and fifth fingers of right hand. Tender protrusion of the ulnar aspect of right wrist.  Neurological: She is alert and oriented to person, place, and time.  Speech is goal-oriented. Moves limbs without ataxia.   Skin: Skin is warm and dry.  Psychiatric: She has a normal mood and affect. Her behavior is normal.  Nursing note and vitals reviewed.   ED Course  Procedures (including critical care time) DIAGNOSTIC STUDIES: Oxygen Saturation is 100% on RA, normal by my interpretation.   COORDINATION OF CARE: 11:10 PM- Will X-Ray right wrist and refer to hand specialist. Pt verbalizes understanding and agrees to plan.  Medications - No data to display  Labs Review Labs Reviewed - No data to display  Imaging Review Dg Wrist Complete Right  11/14/2014   CLINICAL DATA:  Right wrist numbness and pain, greater on the ulnar side. Dog bite 1-1/2 years ago.  EXAM: RIGHT WRIST - COMPLETE 3+ VIEW  COMPARISON:  08/30/2013  FINDINGS: There is no evidence of fracture or dislocation. There is no evidence of arthropathy or other focal bone abnormality. Soft tissues are unremarkable.  IMPRESSION: Negative.   Electronically Signed   By: Ellery Plunk  M.D.   On: 11/14/2014 00:08     EKG Interpretation None      MDM   Final diagnoses:  Right wrist pain  Carpal tunnel syndrome of right wrist    Patient's xray unremarkable for acute changes. Patient instructed to wear her wrist brace. Patient will be discharged with mobic and tramadol for pain. No neurovascular compromise. Patient will have hand surgery referral if symptoms persist. Vitals stable and patient afebrile.   I personally performed the services described in this documentation, which was scribed in my presence. The recorded information has been reviewed and is accurate.    [address] [place], PA-C 11/14/14 [id number]  Mancel Bale, MD 11/19/14 1331

## 2014-11-14 MED ORDER — TRAMADOL HCL 50 MG PO TABS: 50.0000 mg | ORAL_TABLET | Freq: Four times a day (QID) | ORAL | Status: DC | PRN

## 2014-11-14 MED ORDER — MELOXICAM 15 MG PO TABS: 15.0000 mg | ORAL_TABLET | Freq: Every day | ORAL | Status: DC

## 2014-11-14 NOTE — Discharge Instructions (Signed)
Take mobic as needed for pain. Refer to attached documents for more information. Follow up with Dr. Izora Ribas if symptoms persist. Refer to attached documents for more information.

## 2014-11-17 ENCOUNTER — Other Ambulatory Visit: Payer: Self-pay | Admitting: Family Medicine

## 2014-11-17 NOTE — Telephone Encounter (Signed)
Pt was seen in ED on 11/14/14 was given #10 tramadol.

## 2014-11-18 NOTE — Telephone Encounter (Signed)
Left message for patient to return call.

## 2014-11-18 NOTE — Telephone Encounter (Signed)
Patient states that she was seen in the ED and they referred her to hand surgeon [facility]) on [address]

## 2014-11-18 NOTE — Telephone Encounter (Signed)
May refill #15.  Confirm if she has seen orthopedist yet-for her wrist pain.Marland Kitchen

## 2014-11-19 ENCOUNTER — Other Ambulatory Visit: Payer: Self-pay | Admitting: Family Medicine

## 2014-11-20 ENCOUNTER — Telehealth: Payer: Self-pay | Admitting: Family Medicine

## 2014-11-20 MED ORDER — TRAMADOL HCL 50 MG PO TABS: 50.0000 mg | ORAL_TABLET | Freq: Four times a day (QID) | ORAL | Status: DC | PRN

## 2014-11-20 MED ORDER — AMPHETAMINE-DEXTROAMPHETAMINE 20 MG PO TABS: 20.0000 mg | ORAL_TABLET | Freq: Two times a day (BID) | ORAL | Status: DC

## 2014-11-20 NOTE — Telephone Encounter (Signed)
Left message on [facility] that Rx is ready for pickup  

## 2014-11-20 NOTE — Telephone Encounter (Signed)
Rx called in to pharmacy. 

## 2014-11-20 NOTE — Telephone Encounter (Signed)
Pt following up on refill request for tramodal [facility] [address] states they never got. Can you resend?

## 2014-11-20 NOTE — Telephone Encounter (Signed)
Pt request refill of the following: amphetamine-dextroamphetamine (ADDERALL) 20 MG tablet  Pt said she would like to pu 11/21/14    phamacy:

## 2014-11-20 NOTE — Telephone Encounter (Signed)
[facility] [address] and they don't have the RX for tramdol - .

## 2014-11-20 NOTE — Telephone Encounter (Signed)
Last visit 09/29/14 Last refill 10/20/14 #60 0 refill

## 2014-11-20 NOTE — Telephone Encounter (Signed)
Refill okay?  

## 2014-12-17 ENCOUNTER — Telehealth: Payer: Self-pay | Admitting: Family Medicine

## 2014-12-17 NOTE — Telephone Encounter (Signed)
Refill by September 9th

## 2014-12-17 NOTE — Telephone Encounter (Signed)
Pt needs a refill on her ADDERALL please. Call when ready for pick up.

## 2014-12-17 NOTE — Telephone Encounter (Signed)
Last visit 09/29/14 Last refill 11/20/14 #60 0 refill

## 2014-12-19 MED ORDER — AMPHETAMINE-DEXTROAMPHETAMINE 20 MG PO TABS: 20.0000 mg | ORAL_TABLET | Freq: Two times a day (BID) | ORAL | Status: DC

## 2014-12-19 NOTE — Telephone Encounter (Signed)
Pt is aware that RX is ready for pick up. 

## 2014-12-23 NOTE — Telephone Encounter (Signed)
Error/mm °

## 2014-12-29 ENCOUNTER — Other Ambulatory Visit: Payer: Self-pay | Admitting: Family Medicine

## 2014-12-29 NOTE — Telephone Encounter (Signed)
Refill #60 with 5 refills.

## 2014-12-29 NOTE — Telephone Encounter (Signed)
Pt was here on 09/29/14 and the last refills was 07/09/14. Ok to fill?

## 2015-02-09 ENCOUNTER — Other Ambulatory Visit: Payer: Self-pay | Admitting: *Deleted

## 2015-02-09 ENCOUNTER — Telehealth: Payer: Self-pay | Admitting: Family Medicine

## 2015-02-09 MED ORDER — AMPHETAMINE-DEXTROAMPHETAMINE 20 MG PO TABS: 20.0000 mg | ORAL_TABLET | Freq: Two times a day (BID) | ORAL | Status: DC

## 2015-02-09 NOTE — Telephone Encounter (Signed)
Pt has only 2 day of generic adderall left. Pt needs new rx

## 2015-02-09 NOTE — Telephone Encounter (Signed)
Rx printed and given to Dr. K to sign.  

## 2015-02-09 NOTE — Telephone Encounter (Signed)
Dr. Kirtland BouchardK has approved #60 with no refills. Rx printed. Patient is aware Rx is upfront ready for pickup.

## 2015-02-09 NOTE — Telephone Encounter (Signed)
Pt last visit 09/29/14 and last Rx refill 12/19/14 #60 no refills

## 2015-02-18 ENCOUNTER — Other Ambulatory Visit: Payer: Self-pay

## 2015-02-18 ENCOUNTER — Other Ambulatory Visit (INDEPENDENT_AMBULATORY_CARE_PROVIDER_SITE_OTHER): Payer: Self-pay

## 2015-02-18 DIAGNOSIS — Z0000 Encounter for general adult medical examination without abnormal findings: Secondary | ICD-10-CM

## 2015-02-18 LAB — LIPID PANEL
CHOLESTEROL: 174 mg/dL (ref 0–200)
HDL: 58.5 mg/dL (ref >39.00)
LDL CALC: 97 mg/dL (ref 0–99)
NonHDL: 115.47
TRIGLYCERIDES: 90 mg/dL (ref 0.0–149.0)
Total CHOL/HDL Ratio: 3
VLDL: 18 mg/dL (ref 0.0–40.0)

## 2015-02-18 LAB — HEPATIC FUNCTION PANEL
ALBUMIN: 5.2 g/dL (ref 3.5–5.2)
ALT: 9 U/L (ref 0–35)
AST: 10 U/L (ref 0–37)
Alkaline Phosphatase: 53 U/L (ref 39–117)
BILIRUBIN DIRECT: 0.1 mg/dL (ref 0.0–0.3)
TOTAL PROTEIN: 7.2 g/dL (ref 6.0–8.3)
Total Bilirubin: 0.5 mg/dL (ref 0.2–1.2)

## 2015-02-18 LAB — TSH: TSH: 1.96 u[IU]/mL (ref 0.35–4.50)

## 2015-02-18 LAB — CBC WITH DIFFERENTIAL/PLATELET
BASOS PCT: 0.4 % (ref 0.0–3.0)
Basophils Absolute: 0 10*3/uL (ref 0.0–0.1)
Eosinophils Absolute: 0.2 10*3/uL (ref 0.0–0.7)
Eosinophils Relative: 2.5 % (ref 0.0–5.0)
HEMATOCRIT: 44.2 % (ref 36.0–46.0)
Hemoglobin: 14.6 g/dL (ref 12.0–15.0)
LYMPHS PCT: 31 % (ref 12.0–46.0)
Lymphs Abs: 2.3 10*3/uL (ref 0.7–4.0)
MCHC: 33 g/dL (ref 30.0–36.0)
MCV: 94.6 fl (ref 78.0–100.0)
Monocytes Absolute: 0.6 10*3/uL (ref 0.1–1.0)
Monocytes Relative: 7.4 % (ref 3.0–12.0)
Neutro Abs: 4.4 10*3/uL (ref 1.4–7.7)
Neutrophils Relative %: 58.7 % (ref 43.0–77.0)
Platelets: 236 10*3/uL (ref 150.0–400.0)
RBC: 4.68 Mil/uL (ref 3.87–5.11)
RDW: 13.4 % (ref 11.5–15.5)
WBC: 7.5 10*3/uL (ref 4.0–10.5)

## 2015-02-18 LAB — BASIC METABOLIC PANEL
BUN: 9 mg/dL (ref 6–23)
CO2: 29 meq/L (ref 19–32)
CREATININE: 0.63 mg/dL (ref 0.40–1.20)
Calcium: 10.9 mg/dL — ABNORMAL HIGH (ref 8.4–10.5)
Chloride: 101 mEq/L (ref 96–112)
GFR: 122.05 mL/min (ref >60.00)
Glucose, Bld: 95 mg/dL (ref 70–99)
POTASSIUM: 4.3 meq/L (ref 3.5–5.1)
Sodium: 142 mEq/L (ref 135–145)

## 2015-02-19 ENCOUNTER — Other Ambulatory Visit: Payer: Self-pay

## 2015-02-25 ENCOUNTER — Other Ambulatory Visit (HOSPITAL_COMMUNITY)
Admission: RE | Admit: 2015-02-25 | Discharge: 2015-02-25 | Disposition: A | Discharge status: Home / Self Care | Payer: Self-pay | Source: Ambulatory Visit | Attending: Family Medicine | Admitting: Family Medicine

## 2015-02-25 ENCOUNTER — Ambulatory Visit (INDEPENDENT_AMBULATORY_CARE_PROVIDER_SITE_OTHER): Payer: Self-pay | Admitting: Family Medicine

## 2015-02-25 VITALS — BP 120/80 | HR 98 | Temp 98.1°F | Resp 16 | Ht 63.5 in | Wt 94.7 lb

## 2015-02-25 DIAGNOSIS — Z01419 Encounter for gynecological examination (general) (routine) without abnormal findings: Secondary | ICD-10-CM | POA: Insufficient documentation

## 2015-02-25 DIAGNOSIS — Z0000 Encounter for general adult medical examination without abnormal findings: Secondary | ICD-10-CM

## 2015-02-25 DIAGNOSIS — Z124 Encounter for screening for malignant neoplasm of cervix: Secondary | ICD-10-CM

## 2015-02-25 MED ORDER — DIPHENOXYLATE-ATROPINE 2.5-0.025 MG PO TABS: 1.0000 | ORAL_TABLET | Freq: Two times a day (BID) | ORAL | Status: DC | PRN

## 2015-02-25 MED ORDER — NORETHINDRONE ACET-ETHINYL EST 1.5-30 MG-MCG PO TABS: 1.0000 | ORAL_TABLET | Freq: Every day | ORAL | Status: DC

## 2015-02-25 NOTE — Progress Notes (Signed)
Pre visit review using our clinic review tool, if applicable. No additional management support is needed unless otherwise documented below in the visit note. 

## 2015-02-25 NOTE — Progress Notes (Signed)
Subjective:    Patient ID: Leslie [name], female    DOB: 1990/07/27, 24 y.o.   MRN: [id number]  HPI  Patient here for physical. She's not had a Pap smear about 3 years. She would like to start back oral contraception. She previously took Loestrin. Her menses have been regular. She was recently in a stressful relationship and has gotten out and feels that her irritable bowel syndrome symptoms that improved. She has had some recent weight loss but she states her appetite is excellent and she feels that she is eating more than usual. Her diarrhea has been relatively stable. No bloody diarrhea. Minimal abdominal cramping. Currently takes Imodium but prefers Lomotil which has worked more effectively for her in the past.  She has had extensive GI evaluation in past including colonoscopy with no evidence for inflammatory bowel disease  She has no history of bulimia and no issues with anorexia nervosa. No problems with self image. She's trying to gain weight. She does smoke  Past Medical History  Diagnosis Date  . PANIC ATTACK 12/24/2009  . ADD 04/12/2006  . ASTHMA 12/24/2009  . INSOMNIA, TRANSIENT 12/24/2009  . Anxiety   . Depression   . IBS (irritable bowel syndrome)     NEGATIVE COLONSCOPY AND ENDOSCOPY  . Smoker    Past Surgical History  Procedure Laterality Date  . Upper gastrointestinal endoscopy  2009  . Colonoscopy      reports that she has been smoking Cigarettes.  She has a 3.5 pack-year smoking history. She uses smokeless tobacco. She reports that she does not drink alcohol or use illicit drugs. family history includes Alcohol abuse in her paternal uncle; Diabetes in her paternal grandfather; Heart disease in her paternal grandfather; Hyperlipidemia in her maternal grandfather; Hypertension in her paternal grandfather; Mental retardation in her maternal grandmother. No Known Allergies    Review of Systems  Constitutional: Negative for fever, activity change, appetite change,  fatigue and unexpected weight change.  HENT: Negative for ear pain, hearing loss, sore throat and trouble swallowing.   Eyes: Negative for visual disturbance.  Respiratory: Negative for cough and shortness of breath.   Cardiovascular: Negative for chest pain and palpitations.  Gastrointestinal: Positive for diarrhea. Negative for nausea, vomiting, abdominal pain, constipation and blood in stool.  Genitourinary: Negative for dysuria and hematuria.  Musculoskeletal: Negative for myalgias, back pain and arthralgias.  Skin: Negative for rash.  Neurological: Negative for dizziness, syncope and headaches.  Hematological: Negative for adenopathy.  Psychiatric/Behavioral: Negative for confusion and dysphoric mood.       Objective:   Physical Exam  Constitutional: She is oriented to person, place, and time. She appears well-developed and well-nourished.  HENT:  Head: Normocephalic and atraumatic.  Eyes: EOM are normal. Pupils are equal, round, and reactive to light.  Neck: Normal range of motion. Neck supple. No thyromegaly present.  Cardiovascular: Normal rate, regular rhythm and normal heart sounds.   No murmur heard. Pulmonary/Chest: Breath sounds normal. No respiratory distress. She has no wheezes. She has no rales.  Abdominal: Soft. Bowel sounds are normal. She exhibits no distension and no mass. There is no tenderness. There is no rebound and no guarding.  Genitourinary: Vagina normal. No vaginal discharge found.  Normal external genitalia.  Cervix normal appearance   Pap obtained.  Bimanual no mass and non-tender with normal size uterus.    Musculoskeletal: Normal range of motion. She exhibits no edema.  Lymphadenopathy:    She has no cervical adenopathy.  Neurological:  She is alert and oriented to person, place, and time. She displays normal reflexes. No cranial nerve deficit.  Skin: No rash noted.  Psychiatric: She has a normal mood and affect. Her behavior is normal. Judgment and  thought content normal.          Assessment & Plan:  Physical exam. Recent lab work reviewed. No major concerns. Lomotil for as needed use. We've recommended she discontinue Adderall at this time with her recent weight loss. She is strongly encouraged stop smoking. Pap smear obtained. Start back Loestrin 21.  Continue to taper off Klonopin.    Labs reviewed.  Corrected calcium normal   She has excellent albumin and no evidence for malabsorption.

## 2015-02-25 NOTE — Patient Instructions (Signed)
Oral Contraception Information Oral contraceptive pills (OCPs) are medicines taken to prevent pregnancy. OCPs work by preventing the ovaries from releasing eggs. The hormones in OCPs also cause the cervical mucus to thicken, preventing the sperm from entering the uterus. The hormones also cause the uterine lining to become thin, not allowing a fertilized egg to attach to the inside of the uterus. OCPs are highly effective when taken exactly as prescribed. However, OCPs do not prevent sexually transmitted diseases (STDs). Safe sex practices, such as using condoms along with the pill, can help prevent STDs.  Before taking the pill, you may have a physical exam and Pap test. Your health care provider may order blood tests. The health care provider will make sure you are a good candidate for oral contraception. Discuss with your health care provider the possible side effects of the OCP you may be prescribed. When starting an OCP, it can take 2 to 3 months for the body to adjust to the changes in hormone levels in your body.  TYPES OF ORAL CONTRACEPTION  The combination pill--This pill contains estrogen and progestin (synthetic progesterone) hormones. The combination pill comes in 21-day, 28-day, or 91-day packs. Some types of combination pills are meant to be taken continuously (365-day pills). With 21-day packs, you do not take pills for 7 days after the last pill. With 28-day packs, the pill is taken every day. The last 7 pills are without hormones. Certain types of pills have more than 21 hormone-containing pills. With 91-day packs, the first 84 pills contain both hormones, and the last 7 pills contain no hormones or contain estrogen only.  The minipill--This pill contains the progesterone hormone only. The pill is taken every day continuously. It is very important to take the pill at the same time each day. The minipill comes in packs of 28 pills. All 28 pills contain the hormone.  ADVANTAGES OF ORAL  CONTRACEPTIVE PILLS  Decreases premenstrual symptoms.   Treats menstrual period cramps.   Regulates the menstrual cycle.   Decreases a heavy menstrual flow.   May treatacne, depending on the type of pill.   Treats abnormal uterine bleeding.   Treats polycystic ovarian syndrome.   Treats endometriosis.   Can be used as emergency contraception.  THINGS THAT CAN MAKE ORAL CONTRACEPTIVE PILLS LESS EFFECTIVE OCPs can be less effective if:   You forget to take the pill at the same time every day.   You have a stomach or intestinal disease that lessens the absorption of the pill.   You take OCPs with other medicines that make OCPs less effective, such as antibiotics, certain HIV medicines, and some seizure medicines.   You take expired OCPs.   You forget to restart the pill on day 7, when using the packs of 21 pills.  RISKS ASSOCIATED WITH ORAL CONTRACEPTIVE PILLS  Oral contraceptive pills can sometimes cause side effects, such as:  Headache.  Nausea.  Breast tenderness.  Irregular bleeding or spotting. Combination pills are also associated with a small increased risk of:  Blood clots.  Heart attack.  Stroke.   This information is not intended to replace advice given to you by your health care provider. Make sure you discuss any questions you have with your health care provider.   Document Released: 06/18/2002 Document Revised: 01/16/2013 Document Reviewed: 09/16/2012 Elsevier Interactive Patient Education 2016 [employer]  Try to quit smoking- this will likely help you gain some weight. Let's plan follow up in about 2 months to recheck  your weight.

## 2015-03-02 ENCOUNTER — Encounter: Payer: Self-pay | Admitting: Family Medicine

## 2015-03-02 LAB — CYTOLOGY - PAP

## 2015-04-15 ENCOUNTER — Telehealth: Payer: Self-pay | Admitting: Family Medicine

## 2015-04-15 MED ORDER — AMPHETAMINE-DEXTROAMPHETAMINE 20 MG PO TABS: ORAL_TABLET | ORAL | Status: DC

## 2015-04-15 MED ORDER — AMPHETAMINE-DEXTROAMPHETAMINE 20 MG PO TABS: 20.0000 mg | ORAL_TABLET | Freq: Two times a day (BID) | ORAL | Status: DC

## 2015-04-15 NOTE — Telephone Encounter (Signed)
Pt recently seen for her CPE in November 2016.  Last fill was 10/31 #60. Please advise if okay to print scipts x3.  When pt picks up scripts do we need to get a weight check?

## 2015-04-15 NOTE — Telephone Encounter (Signed)
Printed for signature

## 2015-04-15 NOTE — Telephone Encounter (Signed)
Refill times 3 and weight check by March.

## 2015-04-15 NOTE — Telephone Encounter (Signed)
Pt called requesting refill on Adderall. Pt wasn't sure if [facility] wanted her to schedule an appt or wt check before getting meds. Pt is leaving on Sunday for school in [place]. Please advise

## 2015-04-15 NOTE — Telephone Encounter (Signed)
Please advise if pt needs a weight check before getting scripts

## 2015-04-16 NOTE — Telephone Encounter (Signed)
Pt is aware via voicemail that RXs are up front for pick up and to schedule an appt in march.

## 2015-05-07 ENCOUNTER — Telehealth: Payer: Self-pay | Admitting: Family Medicine

## 2015-05-07 NOTE — Telephone Encounter (Signed)
Pt said she is in [place] till mid February and is having a lot of cramping and headaches with her menstrual cycle and is asking if Dr Caryl Never will call her in some tramadol.  [phone]    Pharmacy  [facility] [address] [place]

## 2015-05-08 MED ORDER — TRAMADOL HCL 50 MG PO TABS: ORAL_TABLET | ORAL | Status: DC

## 2015-05-08 NOTE — Telephone Encounter (Signed)
Tramadol 50 mg 1-2 every 6 hours for severe pain #30 with no refill

## 2015-05-08 NOTE — Telephone Encounter (Signed)
Rx called into pharmacy and patient is aware. 

## 2015-05-29 ENCOUNTER — Other Ambulatory Visit: Payer: Self-pay | Admitting: Family Medicine

## 2015-05-29 NOTE — Telephone Encounter (Signed)
Last fill by Dr. Tawanna Cooler on 05-08-2015 #30

## 2015-05-31 NOTE — Telephone Encounter (Signed)
Refill once 

## 2015-06-06 ENCOUNTER — Encounter (HOSPITAL_COMMUNITY): Payer: Self-pay | Admitting: Oncology

## 2015-06-06 ENCOUNTER — Emergency Department (HOSPITAL_COMMUNITY)
Admission: EM | Admit: 2015-06-06 | Discharge: 2015-06-06 | Disposition: A | Discharge status: Home / Self Care | Payer: Self-pay | Attending: Emergency Medicine | Admitting: Emergency Medicine

## 2015-06-06 DIAGNOSIS — Z793 Long term (current) use of hormonal contraceptives: Secondary | ICD-10-CM | POA: Insufficient documentation

## 2015-06-06 DIAGNOSIS — Z8719 Personal history of other diseases of the digestive system: Secondary | ICD-10-CM | POA: Insufficient documentation

## 2015-06-06 DIAGNOSIS — F329 Major depressive disorder, single episode, unspecified: Secondary | ICD-10-CM | POA: Insufficient documentation

## 2015-06-06 DIAGNOSIS — F410 Panic disorder [episodic paroxysmal anxiety] without agoraphobia: Secondary | ICD-10-CM | POA: Insufficient documentation

## 2015-06-06 DIAGNOSIS — R079 Chest pain, unspecified: Secondary | ICD-10-CM | POA: Insufficient documentation

## 2015-06-06 DIAGNOSIS — R1013 Epigastric pain: Secondary | ICD-10-CM | POA: Insufficient documentation

## 2015-06-06 DIAGNOSIS — F17210 Nicotine dependence, cigarettes, uncomplicated: Secondary | ICD-10-CM | POA: Insufficient documentation

## 2015-06-06 DIAGNOSIS — R1010 Upper abdominal pain, unspecified: Secondary | ICD-10-CM

## 2015-06-06 DIAGNOSIS — Z79899 Other long term (current) drug therapy: Secondary | ICD-10-CM | POA: Insufficient documentation

## 2015-06-06 DIAGNOSIS — J45909 Unspecified asthma, uncomplicated: Secondary | ICD-10-CM | POA: Insufficient documentation

## 2015-06-06 DIAGNOSIS — Z8669 Personal history of other diseases of the nervous system and sense organs: Secondary | ICD-10-CM | POA: Insufficient documentation

## 2015-06-06 LAB — CBC
HCT: 41.4 % (ref 36.0–46.0)
Hemoglobin: 13.8 g/dL (ref 12.0–15.0)
MCH: 31.1 pg (ref 26.0–34.0)
MCHC: 33.3 g/dL (ref 30.0–36.0)
MCV: 93.2 fL (ref 78.0–100.0)
Platelets: 279 10*3/uL (ref 150–400)
RBC: 4.44 MIL/uL (ref 3.87–5.11)
RDW: 12.2 % (ref 11.5–15.5)
WBC: 11.6 10*3/uL — AB (ref 4.0–10.5)

## 2015-06-06 LAB — COMPREHENSIVE METABOLIC PANEL
ALBUMIN: 4.8 g/dL (ref 3.5–5.0)
ALT: 11 U/L — ABNORMAL LOW (ref 14–54)
AST: 15 U/L (ref 15–41)
Alkaline Phosphatase: 42 U/L (ref 38–126)
Anion gap: 10 (ref 5–15)
BUN: 9 mg/dL (ref 6–20)
CHLORIDE: 105 mmol/L (ref 101–111)
CO2: 29 mmol/L (ref 22–32)
Calcium: 10.2 mg/dL (ref 8.9–10.3)
Creatinine, Ser: 0.73 mg/dL (ref 0.44–1.00)
GFR calc Af Amer: >60 mL/min (ref >60)
GFR calc non Af Amer: >60 mL/min (ref >60)
Glucose, Bld: 61 mg/dL — ABNORMAL LOW (ref 65–99)
Potassium: 3.8 mmol/L (ref 3.5–5.1)
SODIUM: 144 mmol/L (ref 135–145)
Total Bilirubin: 0.2 mg/dL — ABNORMAL LOW (ref 0.3–1.2)
Total Protein: 7.6 g/dL (ref 6.5–8.1)

## 2015-06-06 LAB — URINALYSIS, ROUTINE W REFLEX MICROSCOPIC
Bilirubin Urine: NEGATIVE
GLUCOSE, UA: NEGATIVE mg/dL
Hgb urine dipstick: NEGATIVE
KETONES UR: NEGATIVE mg/dL
Nitrite: NEGATIVE
PH: 7.5 (ref 5.0–8.0)
Protein, ur: 30 mg/dL — AB
Specific Gravity, Urine: 1.023 (ref 1.005–1.030)

## 2015-06-06 LAB — LIPASE, BLOOD: Lipase: 32 U/L (ref 11–51)

## 2015-06-06 LAB — URINE MICROSCOPIC-ADD ON

## 2015-06-06 MED ORDER — HYDROCODONE-ACETAMINOPHEN 5-325 MG PO TABS
1.0000 | ORAL_TABLET | Freq: Once | ORAL | Status: AC
Start: 2015-06-06 — End: 2015-06-06
  Administered 2015-06-06: 1 via ORAL
  Filled 2015-06-06: qty 1

## 2015-06-06 MED ORDER — FAMOTIDINE 20 MG PO TABS
20.0000 mg | ORAL_TABLET | Freq: Once | ORAL | Status: AC
  Administered 2015-06-06: 20 mg via ORAL
  Filled 2015-06-06: qty 1

## 2015-06-06 MED ORDER — RANITIDINE HCL 150 MG PO TABS: 150.0000 mg | ORAL_TABLET | Freq: Two times a day (BID) | ORAL | Status: DC

## 2015-06-06 MED ORDER — HYDROCODONE-ACETAMINOPHEN 5-325 MG PO TABS: 1.0000 | ORAL_TABLET | ORAL | Status: DC | PRN

## 2015-06-06 MED ORDER — SUCRALFATE 1 G PO TABS: 1.0000 g | ORAL_TABLET | Freq: Three times a day (TID) | ORAL | Status: DC

## 2015-06-06 MED ORDER — GI COCKTAIL ~~LOC~~
30.0000 mL | Freq: Once | ORAL | Status: AC
  Administered 2015-06-06: 30 mL via ORAL
  Filled 2015-06-06: qty 30

## 2015-06-06 NOTE — Discharge Instructions (Signed)

## 2015-06-06 NOTE — ED Provider Notes (Signed)
CSN: [id number]     Arrival date & time 06/06/15  4098 History  By signing my name below, I, Terrance Branch, attest that this documentation has been prepared under the direction and in the presence of Gilda Crease, MD. Electronically Signed: Evon Slack, ED Scribe. 06/06/2015. 1:55 AM.     Chief Complaint  Patient presents with  . Abdominal Pain   [name] presenting with abdominal pain. The history is provided by the patient. No language interpreter was used.  Abdominal Pain  HPI Comments: Leslie [name] is a 25 y.o. female who presents to the Emergency Department complaining of sharp-dull abdomina pain onset 2 days prior. Pt states that she has pain starting in her throat all the way down to her upper abdomen. Pt states that the pain is worse when eating or drinking. Pt states she feels if she can feel solids and liquids moving down her esophagus. Pt denies weakness, dizziness or LOC. Pt does report Hx of GI issues.     Past Medical History  Diagnosis Date  . PANIC ATTACK 12/24/2009  . ADD 04/12/2006  . ASTHMA 12/24/2009  . INSOMNIA, TRANSIENT 12/24/2009  . Anxiety   . Depression   . IBS (irritable bowel syndrome)     NEGATIVE COLONSCOPY AND ENDOSCOPY  . Smoker    Past Surgical History  Procedure Laterality Date  . Upper gastrointestinal endoscopy  2009  . Colonoscopy     Family History  Problem Relation Age of Onset  . Alcohol abuse Paternal Uncle   . Mental retardation Maternal Grandmother   . Hyperlipidemia Maternal Grandfather   . Diabetes Paternal Grandfather   . Hypertension Paternal Grandfather   . Heart disease Paternal Grandfather    Social History  Substance Use Topics  . Smoking status: Current Every Day Smoker -- 0.50 packs/day for 7 years    Types: Cigarettes  . Smokeless tobacco: Never Used  . Alcohol Use: No   OB History    Gravida Para Term Preterm AB TAB SAB Ectopic Multiple Living   0               Review of  Systems  Gastrointestinal: Positive for abdominal pain.  All other systems reviewed and are negative.    Allergies  Review of patient's allergies indicates no known allergies.  Home Medications   Prior to Admission medications   Medication Sig Start Date End Date Taking? Authorizing Provider  acetaminophen (TYLENOL) 325 MG tablet Take 650 mg by mouth every 4 (four) hours as needed.    Historical Provider, MD  amphetamine-dextroamphetamine (ADDERALL) 20 MG tablet Take 1 tablet by mouth two times daily. May refill in one month. 04/15/15   Kristian Covey, MD  amphetamine-dextroamphetamine (ADDERALL) 20 MG tablet Take 1 tablet (20 mg total) by mouth 2 (two) times daily. 04/15/15   Kristian Covey, MD  amphetamine-dextroamphetamine (ADDERALL) 20 MG tablet Take 1 tablet by mouth two times daily. May refill in two months. 04/15/15   Kristian Covey, MD  clonazePAM (KLONOPIN) 1 MG tablet TAKE 1 TABLET BY MOUTH TWICE A DAY AS NEEDED 12/30/14   Kristian Covey, MD  diphenoxylate-atropine (LOMOTIL) 2.5-0.025 MG tablet Take 1 tablet by mouth 2 (two) times daily as needed for diarrhea or loose stools. 02/25/15   Kristian Covey, MD  Norethindrone Acetate-Ethinyl Estradiol (LOESTRIN 1.5/30, 21,) 1.5-30 MG-MCG tablet Take 1 tablet by mouth daily. 02/25/15   Kristian Covey, MD  traMADol Janean Sark)  50 MG tablet TAKE 1-2 TABLETS BY MOUTH EVERY 6 HOURS AS NEEDED. 06/01/15   Kristian Covey, MD   BP 129/74 mmHg  Pulse 78  Temp(Src) 98.2 F (36.8 C) (Oral)  Resp 20  SpO2 100%  LMP 05/17/2015   Physical Exam  Constitutional: She is oriented to person, place, and time. She appears well-developed and well-nourished. No distress.  HENT:  Head: Normocephalic and atraumatic.  Right Ear: Hearing normal.  Left Ear: Hearing normal.  Nose: Nose normal.  Mouth/Throat: Oropharynx is clear and moist and mucous membranes are normal.  Eyes: Conjunctivae and EOM are normal. Pupils are equal, round, and  reactive to light.  Neck: Normal range of motion. Neck supple.  Cardiovascular: Regular rhythm, S1 normal and S2 normal.  Exam reveals no gallop and no friction rub.   No murmur heard. Pulmonary/Chest: Effort normal and breath sounds normal. No respiratory distress. She exhibits no tenderness.  Abdominal: Soft. Normal appearance and bowel sounds are normal. There is no hepatosplenomegaly. There is tenderness. There is no rebound, no guarding, no tenderness at McBurney's point and negative Murphy's sign. No hernia.  Mild epigastric tenderness.   Musculoskeletal: Normal range of motion.  Neurological: She is alert and oriented to person, place, and time. She has normal strength. No cranial nerve deficit or sensory deficit. Coordination normal. GCS eye subscore is 4. GCS verbal subscore is 5. GCS motor subscore is 6.  Skin: Skin is warm, dry and intact. No rash noted. No cyanosis.  Psychiatric: She has a normal mood and affect. Her speech is normal and behavior is normal. Thought content normal.  Nursing note and vitals reviewed.   ED Course  Procedures (including critical care time) DIAGNOSTIC STUDIES: Oxygen Saturation is 100% on RA, normal by my interpretation.    COORDINATION OF CARE: 1:54 AM-Discussed treatment plan with pt at bedside and pt agreed to plan.     Labs Review Labs Reviewed  CBC - Abnormal; Notable for the following:    WBC 11.6 (*)    All other components within normal limits  LIPASE, BLOOD  COMPREHENSIVE METABOLIC PANEL  URINALYSIS, ROUTINE W REFLEX MICROSCOPIC (NOT AT [facility])    Imaging Review No results found.    EKG Interpretation None      MDM   Final diagnoses:  None  abdominal pain  Patient presents to the emergency dept for evaluation of abdominal and chest pain. Patient reports that for the last 2 days she has been having pain that severely worsens when she eats or drinks. 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  She has not had any lower abdominal or pelvic pain or symptoms. There is no rectal bleeding. Patient's current symptoms would be concerning for peptic ulcer disease or at least GERD/gastritis. Her lab work was unremarkable. No evidence of anemia. Patient has not had EGD for nearly 10 years. She will likely require repeat evaluation by gastroenterology. Her most recent evaluation was in [place]. She can contact her GI doctor in [place], but also given referral for [facility], as she has a Adult nurse primary care physician. Will initiate on ranitidine, Carafate, analgesia.  I personally performed the services described in this documentation, which was scribed in my presence. The recorded information has been reviewed and is accurate.       Gilda Crease, MD 06/06/15 [phone]

## 2015-06-06 NOTE — ED Notes (Signed)
MD at bedside. 

## 2015-06-06 NOTE — ED Notes (Signed)
Pt reports being unable to eat or drink w/o pain from her throat to her stomach x 2 days.  Pt states that she has a hx of abdominal issues, including having a colonoscopy that had to be terminated prior to completion d/t bleeding.  Pt rates pain 4/10, dull in nature however if she eats or drinks pain increases to a 7/10.

## 2015-06-06 NOTE — ED Notes (Signed)
Discharge instructions, follow up care, and rx x3 reviewed with patient. Patient verbalized understanding. 

## 2015-06-06 NOTE — ED Notes (Signed)
Waiting for discharge paperwork to discharge patient. Dr. Blinda Leatherwood made aware.

## 2015-06-16 ENCOUNTER — Encounter (HOSPITAL_COMMUNITY): Payer: Self-pay | Admitting: Emergency Medicine

## 2015-06-16 ENCOUNTER — Telehealth: Payer: Self-pay

## 2015-06-16 ENCOUNTER — Emergency Department (HOSPITAL_COMMUNITY)
Admission: EM | Admit: 2015-06-16 | Discharge: 2015-06-16 | Disposition: A | Discharge status: Home / Self Care | Payer: Self-pay | Attending: Emergency Medicine | Admitting: Emergency Medicine

## 2015-06-16 DIAGNOSIS — F329 Major depressive disorder, single episode, unspecified: Secondary | ICD-10-CM | POA: Insufficient documentation

## 2015-06-16 DIAGNOSIS — Z79899 Other long term (current) drug therapy: Secondary | ICD-10-CM | POA: Insufficient documentation

## 2015-06-16 DIAGNOSIS — F410 Panic disorder [episodic paroxysmal anxiety] without agoraphobia: Secondary | ICD-10-CM | POA: Insufficient documentation

## 2015-06-16 DIAGNOSIS — R220 Localized swelling, mass and lump, head: Secondary | ICD-10-CM | POA: Insufficient documentation

## 2015-06-16 DIAGNOSIS — J45909 Unspecified asthma, uncomplicated: Secondary | ICD-10-CM | POA: Insufficient documentation

## 2015-06-16 DIAGNOSIS — L509 Urticaria, unspecified: Secondary | ICD-10-CM | POA: Insufficient documentation

## 2015-06-16 DIAGNOSIS — R21 Rash and other nonspecific skin eruption: Secondary | ICD-10-CM

## 2015-06-16 DIAGNOSIS — Z8669 Personal history of other diseases of the nervous system and sense organs: Secondary | ICD-10-CM | POA: Insufficient documentation

## 2015-06-16 DIAGNOSIS — F17210 Nicotine dependence, cigarettes, uncomplicated: Secondary | ICD-10-CM | POA: Insufficient documentation

## 2015-06-16 DIAGNOSIS — Z8719 Personal history of other diseases of the digestive system: Secondary | ICD-10-CM | POA: Insufficient documentation

## 2015-06-16 MED ORDER — PREDNISONE 10 MG PO TABS: ORAL_TABLET | ORAL | Status: DC

## 2015-06-16 MED ORDER — FAMOTIDINE IN NACL 20-0.9 MG/50ML-% IV SOLN
20.0000 mg | Freq: Once | INTRAVENOUS | Status: AC
  Administered 2015-06-16: 20 mg via INTRAVENOUS
  Filled 2015-06-16: qty 50

## 2015-06-16 MED ORDER — METHYLPREDNISOLONE SODIUM SUCC 125 MG IJ SOLR
125.0000 mg | Freq: Once | INTRAMUSCULAR | Status: AC
  Administered 2015-06-16: 125 mg via INTRAVENOUS
  Filled 2015-06-16: qty 2

## 2015-06-16 MED ORDER — FAMOTIDINE 20 MG PO TABS: 40.0000 mg | ORAL_TABLET | Freq: Two times a day (BID) | ORAL | Status: DC

## 2015-06-16 MED ORDER — PANTOPRAZOLE SODIUM 20 MG PO TBEC: 20.0000 mg | DELAYED_RELEASE_TABLET | Freq: Every day | ORAL | Status: DC

## 2015-06-16 MED ORDER — DIPHENHYDRAMINE HCL 50 MG/ML IJ SOLN
25.0000 mg | Freq: Once | INTRAMUSCULAR | Status: AC
  Administered 2015-06-16: 25 mg via INTRAVENOUS
  Filled 2015-06-16: qty 1

## 2015-06-16 NOTE — ED Notes (Signed)
Pt states that she was seen here on Friday for a stomach ulcer. Prior to being seen she had a rash on her legs that has spread to her back her arms and her throat. Pt has some slight pain in her neck, but has no difficulty breathing. Pt states she has "bb sized bumps under her skin". Pt states the rash got worse this morning, and all of the "hives are now grouped together instead of spread out". She states the rash burns and itches.

## 2015-06-16 NOTE — Telephone Encounter (Signed)
Schedule follow up if hives persist.  She may also consider adding OTC Pepcid or Zantac to anti-histamine.

## 2015-06-16 NOTE — Telephone Encounter (Signed)
PLEASE NOTE: All timestamps contained within this report are represented as [place] Standard Time. CONFIDENTIALTY NOTICE: This fax transmission is intended only for the addressee. It contains information that is legally privileged, confidential or otherwise protected from use or disclosure. If you are not the intended recipient, you are strictly prohibited from reviewing, disclosing, copying using or disseminating any of this information or taking any action in reliance on or regarding this information. If you have received this fax in error, please notify us immediately by telephone so that we can arrange for its return to us. Phone: [phone], Toll-Free: [phone], Fax: [phone] Page: 1 of 2 Call Id: [id number] [facility] Night - Client TELEPHONE ADVICE RECORD [facility] Patient Name: Leslie BullocksKRISTA Morison Gender: Female DOB: Feb 23, 1991 Age: [age] Y 7 M 21 D Return Phone Number: [phone] (Primary) Address: [phone] [facility] [address] City[place]State/Zip: [place] [id number] Client [facility] Night - Client Client Site  [facility] Night Physician Evelena PeatBurchette, Bruce Contact Type Call Who Is Calling Patient / Member / Family / Caregiver Call Type Triage / Clinical Relationship To Patient Self Return Phone Number Please choose phone number Chief Complaint Hives Reason for Call Symptomatic / Request for Health Information Initial Comment caller has widespread painful and itching hives; PreDisposition Did not know what to do Translation No Nurse Assessment Nurse: Vickey SagesAtkins, RN, Jacquilin Date/Time (Eastern Time): 06/15/2015 8:41:53 PM Confirm and document reason for call. If symptomatic, describe symptoms. You must click the next button to save text entered. ---caller has widespread painful and itching hives- Currently on Carafate and Zantac- Meds started over a week ago. Has the patient traveled out of the country  within the last 30 days? ---No Does the patient have any new or worsening symptoms? ---Yes Will a triage be completed? ---Yes Related visit to physician within the last 2 weeks? ---No Does the PT have any chronic conditions? (i.e. diabetes, asthma, etc.) ---No Is the patient pregnant or possibly pregnant? (Ask all females between the ages of 5412-55) ---No Is this a behavioral health or substance abuse call? ---No Guidelines Guideline Title Affirmed Question Affirmed Notes Nurse Date/Time (Eastern Time) Hives [1] Abdominal pain AND [2] pain present > 12 hours Atkins, RN, Jacquilin 06/15/2015 8:42:28 PM Disp. Time Lamount Cohen(Eastern Time) Disposition Final User 06/15/2015 8:39:35 PM Send To Clinical Follow Up Jacinto ReapQueue Cowan, Joseph 06/15/2015 8:49:07 PM See Physician within 24 Hours Yes Vickey SagesAtkins, RN, Jacquilin PLEASE NOTE: All timestamps contained within this report are represented as [place] Standard Time. CONFIDENTIALTY NOTICE: This fax transmission is intended only for the addressee. It contains information that is legally privileged, confidential or otherwise protected from use or disclosure. If you are not the intended recipient, you are strictly prohibited from reviewing, disclosing, copying using or disseminating any of this information or taking any action in reliance on or regarding this information. If you have received this fax in error, please notify us immediately by telephone so that we can arrange for its return to us. Phone: [phone], Toll-Free: [phone], Fax: [phone] Page: 2 of 2 Call Id: [id number] Caller Understands: Yes Disagree/Comply: Comply Care Advice Given Per Guideline SEE PHYSICIAN WITHIN 24 HOURS: * IF OFFICE WILL BE OPEN: You need to be seen within the next 24 hours. Call your doctor when the office opens, and make an appointment. DIET: Offer only a bland diet and clear fluids. CETIRIZINE OR LORATADINE: * Take cetirizine (e.g., Zyrtec) or loratadine once each day  for hives that itch. Continue taking it  once a day until the hives are gone for 24 hours. * CETIRIZINE (e.g., Zyrtec): Adult dosage is 10 mg once each day. * LORATADINE (e.g., Alavert, Claritin): Adult dosage is 10 mg once each day. CAUTION - ANTIHISTAMINES: * Examples include diphenhydramine (Benadryl) and chlorpheniramine (Chlortrimeton, Chlor-tripolon) COOL BATH: Take a cool bath for 10 minutes to relieve itching. (Caution: avoid any chill) Rub very itchy areas with an ice cube for 10 minutes. CALL BACK IF: * You become worse. CARE ADVICE given per Hives (Adult) guideline. Referrals REFERRED TO PCP OFFICE REFERRED TO PCP OFFICE

## 2015-06-16 NOTE — Telephone Encounter (Signed)
Left message for patient to call back  

## 2015-06-16 NOTE — ED Notes (Signed)
PA at bedside.

## 2015-06-16 NOTE — Discharge Instructions (Signed)
Take pepcid as prescribed to help with allergic reaction and your stomach. Take prednisone as prescribed until al gone. protonix daily to help with your stomach. Take benadryl  every 6 hrs. Follow up with primary care doctor in 2 days. Return if worsening.   Hives Hives are itchy, red, swollen areas of the skin. They can vary in size and location on your body. Hives can come and go for hours or several days (acute hives) or for several weeks (chronic hives). Hives do not spread from person to person (noncontagious). They may get worse with scratching, exercise, and emotional stress. CAUSES   Allergic reaction to food, additives, or drugs.  Infections, including the common cold.  Illness, such as vasculitis, lupus, or thyroid disease.  Exposure to sunlight, heat, or cold.  Exercise.  Stress.  Contact with chemicals. SYMPTOMS   Red or white swollen patches on the skin. The patches may change size, shape, and location quickly and repeatedly.  Itching.  Swelling of the hands, feet, and face. This may occur if hives develop deeper in the skin. DIAGNOSIS  Your caregiver can usually tell what is wrong by performing a physical exam. Skin or blood tests may also be done to determine the cause of your hives. In some cases, the cause cannot be determined. TREATMENT  Mild cases usually get better with medicines such as antihistamines. Severe cases may require an emergency epinephrine injection. If the cause of your hives is known, treatment includes avoiding that trigger.  HOME CARE INSTRUCTIONS   Avoid causes that trigger your hives.  Take antihistamines as directed by your caregiver to reduce the severity of your hives. Non-sedating or low-sedating antihistamines are usually recommended. Do not drive while taking an antihistamine.  Take any other medicines prescribed for itching as directed by your caregiver.  Wear loose-fitting clothing.  Keep all follow-up appointments as directed  by your caregiver. SEEK MEDICAL CARE IF:   You have persistent or severe itching that is not relieved with medicine.  You have painful or swollen joints. SEEK IMMEDIATE MEDICAL CARE IF:   You have a fever.  Your tongue or lips are swollen.  You have trouble breathing or swallowing.  You feel tightness in the throat or chest.  You have abdominal pain. These problems may be the first sign of a life-threatening allergic reaction. Call your local emergency services (911 in [place] MAKE SURE YOU:   Understand these instructions.  Will watch your condition.  Will get help right away if you are not doing well or get worse.   This information is not intended to replace advice given to you by your health care provider. Make sure you discuss any questions you have with your health care provider.   Document Released: 03/28/2005 Document Revised: 04/02/2013 Document Reviewed: 06/21/2011 Elsevier Interactive Patient Education 2016 Elsevier Inc. Gastroesophageal Reflux Disease, Adult Normally, food travels down the esophagus and stays in the stomach to be digested. However, when a person has gastroesophageal reflux disease (GERD), food and stomach acid move back up into the esophagus. When this happens, the esophagus becomes sore and inflamed. Over time, GERD can create small holes (ulcers) in the lining of the esophagus.  CAUSES This condition is caused by a problem with the muscle between the esophagus and the stomach (lower esophageal sphincter, or LES). Normally, the LES muscle closes after food passes through the esophagus to the stomach. When the LES is weakened or abnormal, it does not close properly, and that allows food and  stomach acid to go back up into the esophagus. The LES can be weakened by certain dietary substances, medicines, and medical conditions, including:  Tobacco use.  Pregnancy.  Having a hiatal hernia.  Heavy alcohol use.  Certain foods and beverages, such as  coffee, chocolate, onions, and peppermint. RISK FACTORS This condition is more likely to develop in:  People who have an increased body weight.  People who have connective tissue disorders.  People who use NSAID medicines. SYMPTOMS Symptoms of this condition include:  Heartburn.  Difficult or painful swallowing.  The feeling of having a lump in the throat.  Abitter taste in the mouth.  Bad breath.  Having a large amount of saliva.  Having an upset or bloated stomach.  Belching.  Chest pain.  Shortness of breath or wheezing.  Ongoing (chronic) cough or a night-time cough.  Wearing away of tooth enamel.  Weight loss. Different conditions can cause chest pain. Make sure to see your health care provider if you experience chest pain. DIAGNOSIS Your health care provider will take a medical history and perform a physical exam. To determine if you have mild or severe GERD, your health care provider may also monitor how you respond to treatment. You may also have other tests, including:  An endoscopy toexamine your stomach and esophagus with a small camera.  A test thatmeasures the acidity level in your esophagus.  A test thatmeasures how much pressure is on your esophagus.  A barium swallow or modified barium swallow to show the shape, size, and functioning of your esophagus. TREATMENT The goal of treatment is to help relieve your symptoms and to prevent complications. Treatment for this condition may vary depending on how severe your symptoms are. Your health care provider may recommend:  Changes to your diet.  Medicine.  Surgery. HOME CARE INSTRUCTIONS Diet  Follow a diet as recommended by your health care provider. This may involve avoiding foods and drinks such as:  Coffee and tea (with or without caffeine).  Drinks that containalcohol.  Energy drinks and sports drinks.  Carbonated drinks or sodas.  Chocolate and cocoa.  Peppermint and mint  flavorings.  Garlic and onions.  Horseradish.  Spicy and acidic foods, including peppers, chili powder, curry powder, vinegar, hot sauces, and barbecue sauce.  Citrus fruit juices and citrus fruits, such as oranges, lemons, and limes.  Tomato-based foods, such as red sauce, chili, salsa, and pizza with red sauce.  Fried and fatty foods, such as donuts, french fries, potato chips, and high-fat dressings.  High-fat meats, such as hot dogs and fatty cuts of red and white meats, such as rib eye steak, sausage, ham, and bacon.  High-fat dairy items, such as whole milk, butter, and cream cheese.  Eat small, frequent meals instead of large meals.  Avoid drinking large amounts of liquid with your meals.  Avoid eating meals during the 2-3 hours before bedtime.  Avoid lying down right after you eat.  Do not exercise right after you eat. General Instructions  Pay attention to any changes in your symptoms.  Take over-the-counter and prescription medicines only as told by your health care provider. Do not take aspirin, ibuprofen, or other NSAIDs unless your health care provider told you to do so.  Do not use any tobacco products, including cigarettes, chewing tobacco, and e-cigarettes. If you need help quitting, ask your health care provider.  Wear loose-fitting clothing. Do not wear anything tight around your waist that causes pressure on your abdomen.  Raise (  elevate) the head of your bed 6 inches (15cm).  Try to reduce your stress, such as with yoga or meditation. If you need help reducing stress, ask your health care provider.  If you are overweight, reduce your weight to an amount that is healthy for you. Ask your health care provider for guidance about a safe weight loss goal.  Keep all follow-up visits as told by your health care provider. This is important. SEEK MEDICAL CARE IF:  You have new symptoms.  You have unexplained weight loss.  You have difficulty swallowing,  or it hurts to swallow.  You have wheezing or a persistent cough.  Your symptoms do not improve with treatment.  You have a hoarse voice. SEEK IMMEDIATE MEDICAL CARE IF:  You have pain in your arms, neck, jaw, teeth, or back.  You feel sweaty, dizzy, or light-headed.  You have chest pain or shortness of breath.  You vomit and your vomit looks like blood or coffee grounds.  You faint.  Your stool is bloody or black.  You cannot swallow, drink, or eat.   This information is not intended to replace advice given to you by your health care provider. Make sure you discuss any questions you have with your health care provider.   Document Released: 01/05/2005 Document Revised: 12/17/2014 Document Reviewed: 07/23/2014 Elsevier Interactive Patient Education [employer]2016 Elsevier Inc.

## 2015-06-16 NOTE — ED Provider Notes (Signed)
CSN: [id number]     Arrival date & time 06/16/15  1157 History   First MD Initiated Contact with Patient 06/16/15 1518     Chief Complaint  Patient presents with  . Rash     (Consider location/radiation/quality/duration/timing/severity/associated sxs/prior Treatment) HPI Leslie [name] is a 25 y.o. female with hx of asthma, anxiety, depression, IBS, presents to ED with complaint of a rash. Pt states she was seen about a week ago for GERD. Was started on carafate and ranitidine which she has been taking. States that 2 days ago, she developed mild rash to lower legs, back, buttock. States over the last two days, rash has spread. States now it is "all over the body, including face, lips, around eyes." reports rash is itchy. She had taken tylenol for it this morning. No other medications. Reports no shortness of breath or sensation of throat closing but states "it just doesn't feel right." reports swelling around eyes and lips improved through out the day today. She did not take her meds this morning. No other new products, lotions, soaps, detergent. No hx of the same.    Past Medical History  Diagnosis Date  . PANIC ATTACK 12/24/2009  . ADD 04/12/2006  . ASTHMA 12/24/2009  . INSOMNIA, TRANSIENT 12/24/2009  . Anxiety   . Depression   . IBS (irritable bowel syndrome)     NEGATIVE COLONSCOPY AND ENDOSCOPY  . Smoker    Past Surgical History  Procedure Laterality Date  . Upper gastrointestinal endoscopy  2009  . Colonoscopy     Family History  Problem Relation Age of Onset  . Alcohol abuse Paternal Uncle   . Mental retardation Maternal Grandmother   . Hyperlipidemia Maternal Grandfather   . Diabetes Paternal Grandfather   . Hypertension Paternal Grandfather   . Heart disease Paternal Grandfather    Social History  Substance Use Topics  . Smoking status: Current Every Day Smoker -- 0.50 packs/day for 7 years    Types: Cigarettes  . Smokeless tobacco: Never Used  . Alcohol Use: No    OB History    Gravida Para Term Preterm AB TAB SAB Ectopic Multiple Living   0              Review of Systems  Constitutional: Negative for fever and chills.  HENT: Positive for facial swelling.   Respiratory: Negative for cough, chest tightness and shortness of breath.   Cardiovascular: Negative for chest pain, palpitations and leg swelling.  Gastrointestinal: Negative for nausea, vomiting, abdominal pain and diarrhea.  Musculoskeletal: Negative for myalgias, arthralgias, neck pain and neck stiffness.  Skin: Positive for rash.  Neurological: Negative for dizziness, weakness and headaches.  All other systems reviewed and are negative.     Allergies  Review of patient's allergies indicates no known allergies.  Home Medications   Prior to Admission medications   Medication Sig Start Date End Date Taking? Authorizing Provider  acetaminophen (TYLENOL) 500 MG tablet Take 1,000 mg by mouth every 6 (six) hours as needed for moderate pain.   Yes Historical Provider, MD  amphetamine-dextroamphetamine (ADDERALL) 20 MG tablet Take 1 tablet (20 mg total) by mouth 2 (two) times daily. Patient taking differently: Take 10 mg by mouth 2 (two) times daily.  04/15/15  Yes Kristian Covey, MD  clonazePAM (KLONOPIN) 1 MG tablet TAKE 1 TABLET BY MOUTH TWICE A DAY AS NEEDED 12/30/14  Yes Kristian Covey, MD  Cranberry-Vitamin C-Probiotic (AZO CRANBERRY PO) Take 1 tablet by mouth daily.  Yes Historical Provider, MD  HYDROcodone-acetaminophen (NORCO/VICODIN) 5-325 MG tablet Take 1 tablet by mouth every 4 (four) hours as needed for moderate pain. 06/06/15  Yes Gilda Crease, MD  Loperamide HCl (IMODIUM A-D PO) Take 1 tablet by mouth every 2 (two) hours as needed (for loose stools).   Yes Historical Provider, MD  loratadine (CLARITIN) 10 MG tablet Take 10 mg by mouth daily.   Yes Historical Provider, MD  Lysine 1000 MG TABS Take 1 tablet by mouth daily.   Yes Historical Provider, MD   Norethindrone Acetate-Ethinyl Estradiol (LOESTRIN 1.5/30, 21,) 1.5-30 MG-MCG tablet Take 1 tablet by mouth daily. 02/25/15  Yes Kristian Covey, MD  ranitidine (ZANTAC) 150 MG tablet Take 1 tablet (150 mg total) by mouth 2 (two) times daily. 06/06/15  Yes Gilda Crease, MD  sucralfate (CARAFATE) 1 g tablet Take 1 tablet (1 g total) by mouth 4 (four) times daily -  with meals and at bedtime. 06/06/15  Yes Gilda Crease, MD  diphenoxylate-atropine (LOMOTIL) 2.5-0.025 MG tablet Take 1 tablet by mouth 2 (two) times daily as needed for diarrhea or loose stools. Patient not taking: Reported on 06/06/2015 02/25/15   Kristian Covey, MD  traMADol (ULTRAM) 50 MG tablet TAKE 1-2 TABLETS BY MOUTH EVERY 6 HOURS AS NEEDED. Patient not taking: Reported on 06/06/2015 06/01/15   Kristian Covey, MD   BP 111/79 mmHg  Pulse 79  Temp(Src) 98.1 F (36.7 C) (Oral)  Resp 16  Ht  (1.651 m)  Wt 43.999 kg  BMI 16.14 kg/m2  SpO2 100%  LMP 05/17/2015 Physical Exam  Constitutional: She is oriented to person, place, and time. She appears well-developed and well-nourished. No distress.  HENT:  Head: Normocephalic.  No swelling of the lips, tongue, uvula  Eyes: Conjunctivae and EOM are normal. Pupils are equal, round, and reactive to light.  Neck: Neck supple.  Cardiovascular: Normal rate, regular rhythm and normal heart sounds.   Pulmonary/Chest: Effort normal and breath sounds normal. No respiratory distress. She has no wheezes. She has no rales.  No stridor  Abdominal: Soft. Bowel sounds are normal. She exhibits no distension. There is no tenderness. There is no rebound.  Musculoskeletal: She exhibits no edema.  Neurological: She is alert and oriented to person, place, and time.  Skin: Skin is warm and dry.  Diffuse rash, worse to bilateral LE, buttock, back. No rash noted to face or lips. No oral mucosal involvement  Psychiatric: She has a normal mood and affect. Her behavior is normal.   Nursing note and vitals reviewed.   ED Course  Procedures (including critical care time) Labs Review Labs Reviewed - No data to display  Imaging Review No results found. I have personally reviewed and evaluated these images and lab results as part of my medical decision-making.   EKG Interpretation None      MDM   Final diagnoses:  Rash  Urticaria   Pt with diffuse hives, also states "throat not feeling right." VS normal at this time, no swelling of tongue or lips noted. No stridor. No oral mucosal rash. Will give solumedrol, pepcid, benadryl, will monitor.   5:07 PM Rash and itching improving. No respiratory distress. No evidence of angioedema or steven johnsons. Vs normal. Her discharge home with prednisone, Pepcid, Benadryl. Will add protonix instead of carafate. Will have pt follow up with pcp.   Filed Vitals:   06/16/15 1229 06/16/15 1539  BP: 130/89 111/79  Pulse: 75 79  Temp: 98.1 F (36.7 C) 98.1 F (36.7 C)  TempSrc: Oral Oral  Resp: 16 16  Height: 5\' 5"  (1.651 m)   Weight: 43.999 kg   SpO2: 100% 100%      Jaynie Crumbleatyana Gerardo Caiazzo, PA-C 06/16/15 1708  Lorre NickAnthony Allen, MD 06/16/15 2350

## 2015-06-18 NOTE — Telephone Encounter (Signed)
Unable to reach patient via phone.

## 2015-07-03 ENCOUNTER — Other Ambulatory Visit: Payer: Self-pay | Admitting: Family Medicine

## 2015-07-06 NOTE — Telephone Encounter (Signed)
Last refill #15 on 06/01/2015 Last seen on 02-25-2015

## 2015-07-07 NOTE — Telephone Encounter (Signed)
Refill once 

## 2015-07-15 ENCOUNTER — Ambulatory Visit (INDEPENDENT_AMBULATORY_CARE_PROVIDER_SITE_OTHER): Payer: 59 | Admitting: Family Medicine

## 2015-07-15 VITALS — BP 120/80 | HR 120 | Temp 98.8°F | Ht 65.0 in | Wt 96.0 lb

## 2015-07-15 DIAGNOSIS — R404 Transient alteration of awareness: Secondary | ICD-10-CM

## 2015-07-15 DIAGNOSIS — F410 Panic disorder [episodic paroxysmal anxiety] without agoraphobia: Secondary | ICD-10-CM | POA: Diagnosis not present

## 2015-07-15 MED ORDER — CLONAZEPAM 1 MG PO TABS: ORAL_TABLET | ORAL | Status: DC

## 2015-07-15 NOTE — Progress Notes (Signed)
Pre visit review using our clinic review tool, if applicable. No additional management support is needed unless otherwise documented below in the visit note. 

## 2015-07-15 NOTE — Progress Notes (Signed)
Subjective:    Patient ID: Leslie [name], female    DOB: 06-17-1990, 25 y.o.   MRN: [id number]  HPI  Patient has a long history of anxiety with probably panic disorder.  She states over the past several weeks she's had progression of anxiety symptoms. She has also noticed some recurrence of depression symptoms. She was treated for depression several years ago as a teenager. She was evaluated at [facility] last week and was seen by psychiatry and started on low-dose fluoxetine 10 mg once daily. She already takes Klonopin 0.5 mg twice a day and they had advised increasing this to 1 mg twice a day. She has noted both increase in frequency of panic disorder symptoms as well as severity. Denies any suicidal ideation whatsoever at this time.   She states that she has had several episodes recently of decreased responsiveness but no syncope. Her boyfriend has described that she has episodes where she is attempting to speak but "no words come out" and she is slow to regain her speech. Episodes usually last about 30 minutes. She states she's had about 8 episodes altogether past month. No history of known seizure disorder. Patient has no recollection of events. No urine or stool incontinence. No tonic-clonic activity. She has noted that these episodes seem to be proceeded frequently by severe anxiety c/w prior panic symptoms..  She generally has no recollection of the event.  Denies any severe headache, focal weakness, or other new neurologic symptoms.   She does smoke a few cigarettes per day. Denies any illicit drug use.  Chronic medical problems include history of IBS and attention deficit disorder.  Appetite and weight have been stable.  Recent CMP and CBC essentially normal.  Past Medical History  Diagnosis Date  . PANIC ATTACK 12/24/2009  . ADD 04/12/2006  . ASTHMA 12/24/2009  . INSOMNIA, TRANSIENT 12/24/2009  . Anxiety   . Depression   . IBS (irritable bowel syndrome)     NEGATIVE COLONSCOPY AND  ENDOSCOPY  . Smoker    Past Surgical History  Procedure Laterality Date  . Upper gastrointestinal endoscopy  2009  . Colonoscopy      reports that she has been smoking Cigarettes.  She has a 3.5 pack-year smoking history. She has never used smokeless tobacco. She reports that she does not drink alcohol or use illicit drugs. family history includes Alcohol abuse in her paternal uncle; Diabetes in her paternal grandfather; Heart disease in her paternal grandfather; Hyperlipidemia in her maternal grandfather; Hypertension in her paternal grandfather; Mental retardation in her maternal grandmother. No Known Allergies    Review of Systems  Constitutional: Negative for fever, chills, appetite change and unexpected weight change.  Respiratory: Negative for cough and shortness of breath.   Cardiovascular: Negative for chest pain, palpitations and leg swelling.  Gastrointestinal: Negative for abdominal pain.  Neurological: Negative for dizziness and headaches.  Psychiatric/Behavioral: Negative for suicidal ideas and agitation. The patient is nervous/anxious.        Objective:   Physical Exam  Constitutional: She is oriented to person, place, and time. She appears well-developed.  HENT:  Mouth/Throat: Oropharynx is clear and moist.  Neck: Neck supple.  Cardiovascular: Regular rhythm.   Pulmonary/Chest: Effort normal and breath sounds normal. No respiratory distress. She has no wheezes. She has no rales.  Musculoskeletal: She exhibits no edema.  Lymphadenopathy:    She has no cervical adenopathy.  Neurological: She is alert and oriented to person, place, and time. No cranial nerve deficit.  Coordination normal.  No focal strength deficits. Cerebellar function normal  Psychiatric: She has a normal mood and affect. Her behavior is normal. Judgment and thought content normal.          Assessment & Plan:   #1 anxiety disorder. History of probable panic attacks. Currently followed by  psychiatry with recent initiation of fluoxetine. She is encouraged to continue follow-up with them regarding dosage adjustment and reassessment. We have offered her counseling with our Behavioral Health team.   #2 intermittent reported episodes of altered consciousness. Needs further assessment. Etiology unclear. Question seizure disorder vs conversion d/o vs other. We've advised no driving until further evaluated. She'll be Out of work until this is further worked up

## 2015-07-21 ENCOUNTER — Encounter: Payer: Self-pay | Admitting: Neurology

## 2015-07-21 ENCOUNTER — Ambulatory Visit (INDEPENDENT_AMBULATORY_CARE_PROVIDER_SITE_OTHER): Payer: 59 | Admitting: Neurology

## 2015-07-21 VITALS — BP 110/76 | HR 105 | Ht 65.0 in | Wt 95.0 lb

## 2015-07-21 DIAGNOSIS — F411 Generalized anxiety disorder: Secondary | ICD-10-CM | POA: Diagnosis not present

## 2015-07-21 DIAGNOSIS — R404 Transient alteration of awareness: Secondary | ICD-10-CM

## 2015-07-21 NOTE — Patient Instructions (Addendum)
1. Schedule 1-hour sleep-deprived EEG 2. Schedule MRI brain with and without contrast 3. Continue all your medications, continue with seeing psychiatry and therapist 4. As per [place] driving laws, for any episode of loss of awareness/consciousness, one should not drive until 6 months event-free 5. Follow-up in 3 weeks

## 2015-07-21 NOTE — Progress Notes (Signed)
NEUROLOGY CONSULTATION NOTE  Leslie [name] MRN: [id number] DOB: 02/19/1991  Referring provider: Dr. Evelena PeatBrucKelli Churne Burchette Primary care provider: Dr. Evelena PeatBruce Burchette  Reason for consult:  Staring spells  Dear Dr Caryl NeverBurchette:  Thank you for your kind referral of Leslie [name] for consultation of the above symptoms. Although her history is well known to you, please allow me to reiterate it for the purpose of our medical record. The patient was accompanied to the clinic by her boyfriend who also provides collateral information. Records and images were personally reviewed where available.  HISTORY OF PRESENT ILLNESS: This is a pleasant 25 year old right-handed woman with a history of depression, anxiety, ADHD, presenting for evaluation of episodes of staring and unresponsiveness. She reports having 2 episodes when she was 15 or 16, however since February 2016, these have increased in frequency. They thought these were panic attacks, but seizures were also considered. She reports that she started getting depressed in January and "held it in." This turned into panic attacks where she would feel shaky, with palpitations and shortness of breath, tunnel vision, then she loses awareness and comes to with her boyfriend asking her if she was okay. Her boyfriend reports she would have a blank stare and "look like she is dissociated," lasting 5-10 minutes. No oral or hand automatisms seen. He can almost talk her out of it, but when he catches her in the middle of it, it would take him 5-6 times calling her before she turns to look at him. Sometimes she would open her mouth a few times then lock back up, unable to say anything. She would be very anxious and almost in a worn out state afterwards. She describes feeling tired after, no focal weakness. She also reports recurrent episodes of deja vu, she would tell her boyfriend that "this has happened before," and this would cause her to go into a panic where she  has to sit down. No rising epigastric sensation, olfactory/gustatory hallucinations, focal numbness/tingling/weakness, myoclonic jerks. She has not injured herself or fallen down from these, no tongue bite or incontinence. She reports 2 unwitnessed episodes where she is alone, and when she comes to, finds that she has cut herself with a razorblade, but was unaware of it. She denies any wish to die, and states she is not suicidal. She has an average of 8 episodes a month since February, at one point she had 3 in a week. They report she has been better this week. [facility] and was started on Prozac 1-1/2 weeks ago and instructed to take clonazepam regularly.   She reports frequent headaches occurring at least every other day, with pressure in the frontal and occipital regions ("like I slammed my head in the wall"). There is associated photo and phonosensitivity, no nausea/vomiting. Her vision becomes a little blurred. She denies any diplopia, dysarthria, dysphagia, neck/back pain, bladder dysfunction. She was being evaluated for possible Crohn's colitis in the past until she lost her insurance.  She had a normal birth and early development.  There is no history of febrile convulsions, CNS infections such as meningitis/encephalitis, significant traumatic brain injury, neurosurgical procedures, or family history of seizures.  PAST MEDICAL HISTORY: Past Medical History  Diagnosis Date  . PANIC ATTACK 12/24/2009  . ADD 04/12/2006  . ASTHMA 12/24/2009  . INSOMNIA, TRANSIENT 12/24/2009  . Anxiety   . Depression   . IBS (irritable bowel syndrome)     NEGATIVE COLONSCOPY AND ENDOSCOPY  .  Smoker     PAST SURGICAL HISTORY: Past Surgical History  Procedure Laterality Date  . Upper gastrointestinal endoscopy  2009  . Colonoscopy      MEDICATIONS: Current Outpatient Prescriptions on File Prior to Visit  Medication Sig Dispense Refill  . acetaminophen (TYLENOL) 500 MG tablet Take  1,000 mg by mouth every 6 (six) hours as needed for moderate pain.    Marland Kitchen amphetamine-dextroamphetamine (ADDERALL) 20 MG tablet Take 1 tablet (20 mg total) by mouth 2 (two) times daily. (Patient taking differently: Take 10 mg by mouth 2 (two) times daily. ) 60 tablet 0  . clonazePAM (KLONOPIN) 1 MG tablet TAKE 1 TABLET BY MOUTH TWICE A DAY AS NEEDED FOR ANXIETY 60 tablet 5  . FLUoxetine (PROZAC) 10 MG tablet Take 10 mg by mouth daily.    . Loperamide HCl (IMODIUM PO) Take by mouth. Takes 2 tablets daily.    Marland Kitchen loratadine (CLARITIN) 10 MG tablet Take 10 mg by mouth daily.    Marland Kitchen Lysine 1000 MG TABS Take 1 tablet by mouth daily.    . Norethindrone Acetate-Ethinyl Estradiol (LOESTRIN 1.5/30, 21,) 1.5-30 MG-MCG tablet Take 1 tablet by mouth daily. 1 Package 11  . traMADol (ULTRAM) 50 MG tablet TAKE 1-2 TABLETS BY MOUTH EVERY 6 HOURS AS NEEDED (Patient not taking: Reported on 07/21/2015) 40 tablet 0   No current facility-administered medications on file prior to visit.    ALLERGIES: Allergies  Allergen Reactions  . Prednisone Anaphylaxis  . Carafate [Sucralfate] Hives    FAMILY HISTORY: Family History  Problem Relation Age of Onset  . Alcohol abuse Paternal Uncle   . Mental retardation Maternal Grandmother   . Hyperlipidemia Maternal Grandfather   . Diabetes Paternal Grandfather   . Hypertension Paternal Grandfather   . Heart disease Paternal Grandfather     SOCIAL HISTORY: Social History   Social History  . Marital Status: Single    Spouse Name: N/A  . Number of Children: 0  . Years of Education: N/A   Occupational History  . Dog Groomer     [employer]   Social History Main Topics  . Smoking status: Current Every Day Smoker -- 0.50 packs/day for 7 years    Types: Cigarettes  . Smokeless tobacco: Never Used  . Alcohol Use: No  . Drug Use: No  . Sexual Activity: Yes    Birth Control/ Protection: Pill   Other Topics Concern  . Not on file   Social History Narrative     REVIEW OF SYSTEMS: Constitutional: No fevers, chills, or sweats, no generalized fatigue, change in appetite Eyes: No visual changes, double vision, eye pain Ear, nose and throat: No hearing loss, ear pain, nasal congestion, sore throat Cardiovascular: No chest pain, palpitations Respiratory:  No shortness of breath at rest or with exertion, wheezes GastrointestinaI: No nausea, vomiting, diarrhea, abdominal pain, fecal incontinence Genitourinary:  No dysuria, urinary retention or frequency Musculoskeletal:  No neck pain, back pain Integumentary: No rash, pruritus, skin lesions Neurological: as above Psychiatric: + depression,+anxiety, no insomnia Endocrine: No palpitations, fatigue, diaphoresis, mood swings, change in appetite, change in weight, increased thirst Hematologic/Lymphatic:  No anemia, purpura, petechiae. Allergic/Immunologic: no itchy/runny eyes, nasal congestion, recent allergic reactions, rashes  PHYSICAL EXAM: Filed Vitals:   07/21/15 0857  BP: 110/76  Pulse: 105   General: No acute distress Head:  Normocephalic/atraumatic Eyes: Fundoscopic exam shows bilateral sharp discs, no vessel changes, exudates, or hemorrhages Neck: supple, no paraspinal tenderness, full range of motion Back: No  paraspinal tenderness Heart: regular rate and rhythm Lungs: Clear to auscultation bilaterally. Vascular: No carotid bruits. Skin/Extremities: No rash, no edema Neurological Exam: Mental status: alert and oriented to person, place, and time, states it is Monday (instead of Tuesday), no dysarthria or aphasia, Fund of knowledge is appropriate.  Recent and remote memory are intact. 3/3 delayed recall.  Attention and concentration are normal.    Able to name objects and repeat phrases. Cranial nerves: CN I: not tested CN II: pupils equal, round and reactive to light, visual fields intact, fundi unremarkable. CN III, IV, VI:  full range of motion, no nystagmus, no ptosis CN V: facial  sensation intact CN VII: upper and lower face symmetric CN VIII: hearing intact to finger rub CN IX, X: gag intact, uvula midline CN XI: sternocleidomastoid and trapezius muscles intact CN XII: tongue midline Bulk & Tone: normal, no fasciculations. Motor: 5/5 throughout with no pronator drift. Sensation: decreased pin and cold on right LE, otherwise intact to all modalities on both UE. Intact vibration and joint position sense.  No extinction to double simultaneous stimulation.  Romberg test negative Deep Tendon Reflexes: +2 throughout, no ankle clonus Plantar responses: downgoing bilaterally Cerebellar: no incoordination on finger to nose testing Gait: narrow-based and steady, able to tandem walk adequately. Tremor: none  IMPRESSION: This is a pleasant 25 year old right-handed woman with a history of anxiety, depression, ADHD, presenting for new onset episodes of panic attacks that would lead to staring and unresponsiveness. She also reports recurrent episodes of deja vu that would lead to a panic attack. The etiology of her symptoms is unclear, focal seizures with impaired awareness can present in a similar way to panic attacks/dissociative disorder. Neurological exam shows subjective decreased sensation on the right lower extremity, she has no clear epilepsy risk factors. MRI brain with and without contrast and a 1-hour sleep-deprived EEG will be ordered. We will consider a 24-hour EEG to further classify her symptoms if the routine EEG is normal. We discussed [place] driving laws that indicate one should not drive after an episode of loss of awareness until 6 months event-free. She was advised to continue follow-up with psychiatry and psychotherapy. She will follow-up in 3 weeks.   Thank you for allowing me to participate in the care of this patient. Please do not hesitate to call for any questions or concerns.   Patrcia Dolly, M.D.  CC: Dr. Caryl Never

## 2015-07-23 ENCOUNTER — Other Ambulatory Visit: Payer: Self-pay

## 2015-07-27 ENCOUNTER — Telehealth: Payer: Self-pay | Admitting: Family Medicine

## 2015-07-27 NOTE — Telephone Encounter (Signed)
Pt would like a call back about being out of work said Dr Caryl NeverBurchette had wrote her out through  4/19/17an was told by the doctor that he may have to adjust the dates a few time   [phone]

## 2015-07-27 NOTE — Telephone Encounter (Signed)
OK to extend, but further work extension beyond then would need to come from neurology.

## 2015-07-27 NOTE — Telephone Encounter (Signed)
Leslie [name]'s work note has her returning to work on 07/29/15. She has a pending appt for an MRI on 4/19, EEG on 4/20 and an OV on 5/2 to speak with the Neurologist. Please advise if you want to extend her work until her follow up appt.

## 2015-07-28 ENCOUNTER — Telehealth: Payer: Self-pay | Admitting: Family Medicine

## 2015-07-28 NOTE — Telephone Encounter (Signed)
Note has been completed for Dr. Caryl NeverBurchette to sign. Will try calling patient back later today for pick up.

## 2015-07-28 NOTE — Telephone Encounter (Signed)
Pt is aware via voicemail that note is up front for pick up.

## 2015-07-28 NOTE — Telephone Encounter (Signed)
Pt did not have insurance, but now has insurance and want to see if PA for Adderall was received.

## 2015-07-29 ENCOUNTER — Ambulatory Visit
Admission: RE | Admit: 2015-07-29 | Discharge: 2015-07-29 | Disposition: A | Discharge status: Home / Self Care | Payer: 59 | Source: Ambulatory Visit | Attending: Neurology | Admitting: Neurology

## 2015-07-29 MED ORDER — GADOBENATE DIMEGLUMINE 529 MG/ML IV SOLN
8.0000 mL | Freq: Once | INTRAVENOUS | Status: AC | PRN
  Administered 2015-07-29: 8 mL via INTRAVENOUS

## 2015-07-29 NOTE — Telephone Encounter (Signed)
Pt aware of previous message.

## 2015-07-29 NOTE — Telephone Encounter (Signed)
Please let patient know the PA was received yesterday and I have submitted it to her insurance today.  She should allow 3-5 business days to hear a response back from her insurance regarding if the PA is approved or not.

## 2015-07-30 ENCOUNTER — Ambulatory Visit (INDEPENDENT_AMBULATORY_CARE_PROVIDER_SITE_OTHER): Payer: 59 | Admitting: Neurology

## 2015-07-30 DIAGNOSIS — R404 Transient alteration of awareness: Secondary | ICD-10-CM | POA: Diagnosis not present

## 2015-08-03 ENCOUNTER — Telehealth: Payer: Self-pay | Admitting: Family Medicine

## 2015-08-03 NOTE — Procedures (Signed)
ELECTROENCEPHALOGRAM REPORT  Date of Study: 07/30/2015  Patient's Name: Leslie [name] MRN: [id number] Date of Birth: 1990-05-21  Referring Provider: Dr. Patrcia DollyKaren Budd Freiermuth  Clinical History: This is a 25 year old woman with recurrent episodes of panic attacks that would progress to staring and unresponsiveness.  Medications: Klonopin, Tylenol, Adderall, Prozac, Imodium, Claritin, Lysine,Loestrin, Ultram  Technical Summary: A multichannel digital 1-hour sleep-deprived EEG recording measured by the international 10-20 system with electrodes applied with paste and impedances below 5000 ohms performed in our laboratory with EKG monitoring in an awake and asleep patient.  Hyperventilation and photic stimulation were performed.  The digital EEG was referentially recorded, reformatted, and digitally filtered in a variety of bipolar and referential montages for optimal display.    Description: The patient is awake and asleep during the recording.  During maximal wakefulness, there is a symmetric, medium voltage 10 Hz posterior dominant rhythm that attenuates with eye opening.  The record is symmetric.  There is an excess amount of diffuse low voltage beta activity seen. During drowsiness and sleep, there is an increase in theta slowing of the background.  Vertex waves and symmetric sleep spindles were seen.  Hyperventilation and photic stimulation did not elicit any abnormalities.  There were no epileptiform discharges or electrographic seizures seen.    EKG lead was unremarkable.  Impression: This 1-hour awake and asleep EEG is normal except for excess diffuse low voltage beta activity.    Clinical Correlation Excess beta activity is typically seen in the presence of benzodiazepines or barbiturates. A normal EEG does not exclude a clinical diagnosis of epilepsy.  If further clinical questions remain, prolonged EEG may be helpful.  Clinical correlation is advised.   Patrcia DollyKaren Joud Pettinato, M.D.

## 2015-08-03 NOTE — Telephone Encounter (Signed)
-----   Message from Van ClinesKaren M Aquino, MD sent at 08/02/2015 11:15 PM EDT ----- Pls let her know MRI brain is normal, no evidence of tumor, stroke, or bleed

## 2015-08-03 NOTE — Telephone Encounter (Signed)
Patient notified of result. 

## 2015-08-04 ENCOUNTER — Telehealth: Payer: Self-pay | Admitting: *Deleted

## 2015-08-04 NOTE — Telephone Encounter (Signed)
Prior authorization request received from [facility] for Amphetamine 20mg .  PA completed using CoverMyMeds,  PA approved.

## 2015-08-11 ENCOUNTER — Ambulatory Visit: Payer: Self-pay | Admitting: Neurology

## 2015-08-12 ENCOUNTER — Ambulatory Visit (INDEPENDENT_AMBULATORY_CARE_PROVIDER_SITE_OTHER): Payer: 59 | Admitting: Family Medicine

## 2015-08-12 VITALS — BP 120/80 | HR 116 | Temp 98.2°F | Ht 65.0 in | Wt 95.3 lb

## 2015-08-12 DIAGNOSIS — F410 Panic disorder [episodic paroxysmal anxiety] without agoraphobia: Secondary | ICD-10-CM | POA: Diagnosis not present

## 2015-08-12 DIAGNOSIS — R404 Transient alteration of awareness: Secondary | ICD-10-CM | POA: Diagnosis not present

## 2015-08-12 NOTE — Progress Notes (Signed)
Pre visit review using our clinic review tool, if applicable. No additional management support is needed unless otherwise documented below in the visit note. 
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
## 2015-08-13 ENCOUNTER — Other Ambulatory Visit: Payer: Self-pay | Admitting: General Practice

## 2015-08-13 MED ORDER — NORETHINDRONE ACET-ETHINYL EST 1.5-30 MG-MCG PO TABS: 1.0000 | ORAL_TABLET | Freq: Every day | ORAL | Status: DC

## 2015-08-17 ENCOUNTER — Telehealth: Payer: Self-pay | Admitting: Neurology

## 2015-08-17 NOTE — Telephone Encounter (Signed)
PT called in regards to short term disability papers and faxing notes to her PC/Dawn CB# [phone]

## 2015-08-18 NOTE — Telephone Encounter (Signed)
Can you look at her sleep deprived EEG results, she hasn't been notified. She is wanting to know what next steps are. Her pcp has continued her oow status until 5/13, she states he told her that her neurologist needs to address her oow status moving forward. She knows that she isn't supposed to drive. She works as a Research scientist (medical)dog groomer and she states there is concern of her having one of these episodes while she is working with sharp objects. [facility] [facility].

## 2015-08-18 NOTE — Telephone Encounter (Signed)
I think that from a pure neuro standpoint, she can return to work as long as others are around when she is working.  She cannot drive, however.  If we need to do 24 hour EEG sooner, we can do that too.

## 2015-08-18 NOTE — Telephone Encounter (Signed)
Her EEG was normal.  Dr. Karel JarvisAquino said that we should do 24 hour ambulatory if normal but I would probably do that once Dr. Karel JarvisAquino is back.

## 2015-08-18 NOTE — Telephone Encounter (Signed)
Lmovm to rtn my call. 

## 2015-08-18 NOTE — Telephone Encounter (Signed)
What about her work status?

## 2015-08-18 NOTE — Telephone Encounter (Signed)
Patient returned my call. I did notify her of result as well as advisment on returning to work. She states that she wants to think about if she wants to proceed with the 24 hour eeg before Dr. Karel JarvisAquino is back in the office. She will let us know.

## 2015-08-31 ENCOUNTER — Telehealth: Payer: Self-pay | Admitting: Family Medicine

## 2015-08-31 NOTE — Telephone Encounter (Signed)
Last seen on 08/12/15 Last refill  Was 04-15-2015 #60 Please advise on refill

## 2015-08-31 NOTE — Telephone Encounter (Signed)
Refill OK

## 2015-08-31 NOTE — Telephone Encounter (Signed)
Pt request refill of the following: amphetamine-dextroamphetamine (ADDERALL) 20 MG tablet ° ° °Phamacy: ° °

## 2015-09-01 MED ORDER — AMPHETAMINE-DEXTROAMPHETAMINE 20 MG PO TABS: 20.0000 mg | ORAL_TABLET | Freq: Two times a day (BID) | ORAL | Status: DC

## 2015-09-01 NOTE — Telephone Encounter (Signed)
Printed for signature

## 2015-09-01 NOTE — Telephone Encounter (Signed)
Pt is aware that RX is up front for pick up. 

## 2015-09-01 NOTE — Telephone Encounter (Signed)
error 

## 2015-10-08 ENCOUNTER — Other Ambulatory Visit: Payer: Self-pay | Admitting: Family Medicine

## 2015-10-09 NOTE — Telephone Encounter (Signed)
Refill once 

## 2015-10-26 ENCOUNTER — Other Ambulatory Visit: Payer: Self-pay | Admitting: Family Medicine

## 2015-10-26 NOTE — Telephone Encounter (Signed)
Last refill was 10/09/2015 #40 Last OV 08/12/2015  No pending scheduled Please advise on refill

## 2015-10-26 NOTE — Telephone Encounter (Signed)
I do not see any clear indication why she should still be on this.  Needs further clarification.  She should not be taking regularly.

## 2015-10-27 ENCOUNTER — Telehealth: Payer: Self-pay | Admitting: Family Medicine

## 2015-10-27 NOTE — Telephone Encounter (Signed)
Pt need new Rx for Adderall °

## 2015-10-27 NOTE — Telephone Encounter (Signed)
Refill OK

## 2015-10-27 NOTE — Telephone Encounter (Signed)
Last OV 08/12/2015 Last refill 09/01/2015 #60, 0rf Please advise

## 2015-10-28 MED ORDER — AMPHETAMINE-DEXTROAMPHETAMINE 20 MG PO TABS: 20.0000 mg | ORAL_TABLET | Freq: Two times a day (BID) | ORAL | Status: DC

## 2015-10-28 NOTE — Telephone Encounter (Signed)
Printed for signature

## 2015-10-28 NOTE — Telephone Encounter (Signed)
Script is up front for pick up. Pt is aware. 

## 2015-10-29 ENCOUNTER — Ambulatory Visit: Payer: Self-pay | Admitting: Neurology

## 2015-11-05 ENCOUNTER — Other Ambulatory Visit: Payer: Self-pay | Admitting: Family Medicine

## 2015-11-06 ENCOUNTER — Telehealth: Payer: Self-pay | Admitting: Family Medicine

## 2015-11-06 NOTE — Telephone Encounter (Signed)
Pt need new Rx for Tramadol and is taking it for nerve damage/ carpel tunnel syndrome and take two it at night only  Pharm:  [facility].

## 2015-11-06 NOTE — Telephone Encounter (Signed)
Dr. Caryl Never does not want pt to be on this medication regularly. She needs to schedule a follow up to discuss this. Can you schedule her a "follow Up appt for carpel tunnel/nerve pain. Thanks.

## 2015-11-06 NOTE — Telephone Encounter (Signed)
Pt has been scheduled.  °

## 2015-11-09 ENCOUNTER — Ambulatory Visit: Payer: Self-pay | Admitting: Family Medicine

## 2015-11-09 ENCOUNTER — Other Ambulatory Visit: Payer: Self-pay | Admitting: Family Medicine

## 2015-11-18 ENCOUNTER — Ambulatory Visit (INDEPENDENT_AMBULATORY_CARE_PROVIDER_SITE_OTHER): Payer: Self-pay | Admitting: Family Medicine

## 2015-11-18 ENCOUNTER — Other Ambulatory Visit: Payer: Self-pay | Admitting: Family Medicine

## 2015-11-18 VITALS — BP 100/76 | HR 101 | Temp 98.2°F | Ht 65.0 in | Wt 99.5 lb

## 2015-11-18 DIAGNOSIS — M25531 Pain in right wrist: Secondary | ICD-10-CM

## 2015-11-18 NOTE — Patient Instructions (Signed)
Follow up for any right hand weakness or progressive numbness If symptoms persist, we need to consider referral back to hand specialist.

## 2015-11-18 NOTE — Progress Notes (Signed)
Subjective:     Patient ID: Leslie [name], female   DOB: Dec 07, 1990, 25 y.o.   MRN: [id number]  HPI Patient seen with right wrist pain She's had intermittent pain for at least a few years. No specific injury. She does housecleaning. Couple years ago we referred her to orthopedics. She had unremarkable plain films. She apparently was told she had possible ulnar nerve involvement. Her pain is mostly along the ulnar aspect of the wrist she does have occasional numbness in the fifth and fourth digit. No weakness. Occasional elbow pain.  She has taken tramadol in the past. She states she has no insurance coverage to see specialist at this time. Has not noted any swelling. No erythema. No ecchymosis. She has used a small wrist brace without much improvement  Past Medical History:  Diagnosis Date  . ADD 04/12/2006  . Anxiety   . ASTHMA 12/24/2009  . Depression   . IBS (irritable bowel syndrome)    NEGATIVE COLONSCOPY AND ENDOSCOPY  . INSOMNIA, TRANSIENT 12/24/2009  . PANIC ATTACK 12/24/2009  . Smoker    Past Surgical History:  Procedure Laterality Date  . COLONOSCOPY    . UPPER GASTROINTESTINAL ENDOSCOPY  2009    reports that she has been smoking Cigarettes.  She has a 3.50 pack-year smoking history. She has never used smokeless tobacco. She reports that she does not drink alcohol or use drugs. family history includes Alcohol abuse in her paternal uncle; Diabetes in her paternal grandfather; Heart disease in her paternal grandfather; Hyperlipidemia in her maternal grandfather; Hypertension in her paternal grandfather; Mental retardation in her maternal grandmother. Allergies  Allergen Reactions  . Prednisone Anaphylaxis  . Buspirone     "made her feel weird"  . Carafate [Sucralfate] Hives  . Meloxicam Other (See Comments)    GI upset     Review of Systems  Neurological: Positive for numbness. Negative for weakness.       Objective:   Physical Exam  Constitutional: She appears  well-developed and well-nourished.  Cardiovascular: Normal rate and regular rhythm.   Musculoskeletal:  Right wrist reveals no muscle atrophy. No erythema. No warmth. Full range of motion right wrist. No bony tenderness. Full range of motion all digits.  Neurological:  Full strength right hand and upper extremity. No sensory impairment to touch       Assessment:     Right hand pain and wrist pain. She is describing what sounds like an ulnar nerve involvement/? Entrapment.  No motor weakness.    Plan:     -We have recommended against further use of tramadol. We explained anti-inflammatories would not likely help much -She really needs to see hand specialist again but she has no coverage at this point for visits. -Avoid applying any pressure to the right elbow -Follow-up promptly for any weakness or progressive numbness  Kristian CoveyBruce W Burchette MD [facility]

## 2015-11-18 NOTE — Progress Notes (Signed)
Pre visit review using our clinic review tool, if applicable. No additional management support is needed unless otherwise documented below in the visit note. 

## 2015-11-23 ENCOUNTER — Other Ambulatory Visit: Payer: Self-pay | Admitting: Family Medicine

## 2015-11-23 NOTE — Telephone Encounter (Signed)
No.  We are not.  I did offer for her to see hand specialist again and she declined.

## 2015-11-23 NOTE — Telephone Encounter (Signed)
Pt keeps sending a request for this medication from the pharmacy. After she left on Friday I received another one. You are not prescribing medication for her anymore are you? Just wanted to clarify.

## 2015-12-17 ENCOUNTER — Other Ambulatory Visit: Payer: Self-pay | Admitting: Family Medicine

## 2015-12-20 NOTE — Telephone Encounter (Signed)
She should not be one this.  We discussed this at last visit.  I offered ortho follow up if her wrist pain is no better.

## 2015-12-23 ENCOUNTER — Telehealth: Payer: Self-pay | Admitting: Family Medicine

## 2015-12-23 NOTE — Telephone Encounter (Signed)
Refill OK

## 2015-12-23 NOTE — Telephone Encounter (Signed)
Pt needs new rx adderall 20 mg °

## 2015-12-23 NOTE — Telephone Encounter (Signed)
Last refill was 10/28/15 #60 Last OV 11/18/2015 Please advise

## 2015-12-24 NOTE — Telephone Encounter (Signed)
Will print tomorrow

## 2015-12-25 MED ORDER — AMPHETAMINE-DEXTROAMPHETAMINE 20 MG PO TABS: 20.0000 mg | ORAL_TABLET | Freq: Two times a day (BID) | ORAL | 0 refills | Status: DC

## 2015-12-25 NOTE — Telephone Encounter (Signed)
Printed for signature

## 2015-12-25 NOTE — Telephone Encounter (Signed)
Pt is aware that script is up front.  

## 2016-01-20 ENCOUNTER — Other Ambulatory Visit: Payer: Self-pay | Admitting: Family Medicine

## 2016-01-20 ENCOUNTER — Encounter: Payer: Self-pay | Admitting: Family Medicine

## 2016-01-25 ENCOUNTER — Other Ambulatory Visit: Payer: Self-pay | Admitting: Family Medicine

## 2016-01-25 MED ORDER — AMPHETAMINE-DEXTROAMPHETAMINE 20 MG PO TABS: 20.0000 mg | ORAL_TABLET | Freq: Two times a day (BID) | ORAL | 0 refills | Status: DC

## 2016-01-25 NOTE — Telephone Encounter (Signed)
OK to refill once.   

## 2016-01-26 ENCOUNTER — Other Ambulatory Visit: Payer: Self-pay

## 2016-01-26 MED ORDER — AMPHETAMINE-DEXTROAMPHETAMINE 20 MG PO TABS: 20.0000 mg | ORAL_TABLET | Freq: Two times a day (BID) | ORAL | 0 refills | Status: DC

## 2016-02-03 ENCOUNTER — Other Ambulatory Visit: Payer: Self-pay | Admitting: Family Medicine

## 2016-02-05 NOTE — Telephone Encounter (Signed)
Pt need new Rx for clonazepam   [facility] [id number]

## 2016-03-02 ENCOUNTER — Other Ambulatory Visit: Payer: Self-pay | Admitting: Family Medicine

## 2016-03-07 ENCOUNTER — Ambulatory Visit (INDEPENDENT_AMBULATORY_CARE_PROVIDER_SITE_OTHER): Payer: Self-pay | Admitting: Family Medicine

## 2016-03-07 ENCOUNTER — Telehealth: Payer: Self-pay

## 2016-03-07 VITALS — BP 110/68 | HR 127 | Temp 98.4°F | Ht 65.0 in | Wt 96.8 lb

## 2016-03-07 DIAGNOSIS — F339 Major depressive disorder, recurrent, unspecified: Secondary | ICD-10-CM

## 2016-03-07 MED ORDER — AMPHETAMINE-DEXTROAMPHETAMINE 20 MG PO TABS: 20.0000 mg | ORAL_TABLET | Freq: Two times a day (BID) | ORAL | 0 refills | Status: DC

## 2016-03-07 MED ORDER — FLUOXETINE HCL 20 MG PO TABS: 20.0000 mg | ORAL_TABLET | Freq: Every day | ORAL | 5 refills | Status: DC

## 2016-03-07 NOTE — Progress Notes (Signed)
Pre visit review using our clinic review tool, if applicable. No additional management support is needed unless otherwise documented below in the visit note. 

## 2016-03-07 NOTE — Telephone Encounter (Signed)
Last refill 01/26/2016 #60 Please advise if okay to fill Thanks

## 2016-03-07 NOTE — Telephone Encounter (Signed)
If this is for Adderall, OK to refill.

## 2016-03-07 NOTE — Addendum Note (Signed)
Addended by: Tempie HoistMCNEIL, Ramie Palladino M on: 03/07/2016 08:34 AM   Modules accepted: Orders

## 2016-03-07 NOTE — Telephone Encounter (Signed)
Medication printed and up front for pick up.  

## 2016-03-07 NOTE — Progress Notes (Signed)
Subjective:     Patient ID: Leslie ChurnKrista N [name], female   DOB: June 25, 1990, 25 y.o.   MRN: [id number]  HPI Patient seen with increased depressive symptoms. She has long history of recurrent depression. She states that she wonders whether she may have seasonal affect disorder. She has noted a pattern that she seems to get depressed easily going into the winter months. She has recently had decreased motivation, decreased concentration, and frequent sad mood. She's had past history of suicidal ideation but none currently. She states that one of her best friends committed suicide a couple of weeks ago and this has been very difficult for her.  She also recently lost a dog. She previously took Effexor but had side effects. She was treated several years ago with Prozac which worked well for her. She tapered herself off after about 4 months.  No history of agitation or any specific concerns for bipolar disorder. She has long-standing anxiety and has been on clonazepam for several years. She does not use any alcohol. No illicit drug use.  Past Medical History:  Diagnosis Date  . ADD 04/12/2006  . Anxiety   . ASTHMA 12/24/2009  . Depression   . IBS (irritable bowel syndrome)    NEGATIVE COLONSCOPY AND ENDOSCOPY  . INSOMNIA, TRANSIENT 12/24/2009  . PANIC ATTACK 12/24/2009  . Smoker    Past Surgical History:  Procedure Laterality Date  . COLONOSCOPY    . UPPER GASTROINTESTINAL ENDOSCOPY  2009    reports that she has been smoking Cigarettes.  She has a 3.50 pack-year smoking history. She has never used smokeless tobacco. She reports that she does not drink alcohol or use drugs. family history includes Alcohol abuse in her paternal uncle; Diabetes in her paternal grandfather; Heart disease in her paternal grandfather; Hyperlipidemia in her maternal grandfather; Hypertension in her paternal grandfather; Mental retardation in her maternal grandmother. Allergies  Allergen Reactions  . Prednisone Anaphylaxis  .  Buspirone     "made her feel weird"  . Carafate [Sucralfate] Hives  . Meloxicam Other (See Comments)    GI upset     Review of Systems  Constitutional: Negative for appetite change and unexpected weight change.  Respiratory: Negative for shortness of breath.   Cardiovascular: Negative for chest pain.  Neurological: Negative for dizziness and weakness.  Psychiatric/Behavioral: Positive for dysphoric mood and sleep disturbance. Negative for agitation, confusion and suicidal ideas. The patient is nervous/anxious.        Objective:   Physical Exam  Constitutional: She is oriented to person, place, and time. She appears well-developed and well-nourished.  Cardiovascular: Normal rate and regular rhythm.   Pulmonary/Chest: Effort normal and breath sounds normal. No respiratory distress. She has no wheezes. She has no rales.  Neurological: She is alert and oriented to person, place, and time.  Psychiatric: She has a normal mood and affect. Her behavior is normal. Judgment and thought content normal.       Assessment:     Recurrent major depressive episode    Plan:     -Start back Prozac 20 mg once daily which she has responded well to in the past -We discussed possible counseling and at this point she would like to follow-up here first. Reassess here in 3 weeks and sooner as needed -Follow-up immediately for any suicidal ideation -over 25 minutes were spent with patient of which > 50% in direct counseling regarding signs and symptoms of depression, appropriate use of medication, possible medications side effects, and non-pharmacologic therapies  for depression.  Kristian CoveyBruce W Gerrald Basu MD [facility]

## 2016-04-06 ENCOUNTER — Other Ambulatory Visit: Payer: Self-pay | Admitting: Family Medicine

## 2016-04-06 MED ORDER — AMPHETAMINE-DEXTROAMPHETAMINE 20 MG PO TABS: 20.0000 mg | ORAL_TABLET | Freq: Two times a day (BID) | ORAL | 0 refills | Status: DC

## 2016-04-06 NOTE — Telephone Encounter (Signed)
Refill OK

## 2016-04-06 NOTE — Telephone Encounter (Signed)
Last refill 03/07/2016 Please advise

## 2016-04-13 ENCOUNTER — Encounter: Payer: Self-pay | Admitting: Family Medicine

## 2016-04-13 ENCOUNTER — Ambulatory Visit (INDEPENDENT_AMBULATORY_CARE_PROVIDER_SITE_OTHER): Payer: Self-pay | Admitting: Family Medicine

## 2016-04-13 VITALS — BP 120/80 | HR 114 | Temp 98.5°F | Wt 100.8 lb

## 2016-04-13 DIAGNOSIS — M2550 Pain in unspecified joint: Secondary | ICD-10-CM

## 2016-04-13 MED ORDER — TRAMADOL HCL 50 MG PO TABS: 50.0000 mg | ORAL_TABLET | Freq: Four times a day (QID) | ORAL | 0 refills | Status: DC | PRN

## 2016-04-13 NOTE — Progress Notes (Signed)
Subjective:     Patient ID: Leslie ChurnKrista N [name], female   DOB: 24-Nov-1990, 26 y.o.   MRN: [id number]  HPI Patient has a long history of intermittent arthralgias. She states for the past several weeks had progressive symptoms with multiple joints aching especially in cold weather including knees, ankles, shoulders, wrists, hips, elbows. She has not noted objective swelling or redness. She does feel joints are sometimes warm to touch. She also has increased stiffness after prolonged sitting. She has long-standing history of irritable bowel syndrome. Celiac antibody testing has previously been negative. She recently described some possible Raynaud's symptoms in her hands -especially in the cold. She notices that some fingers tend to blanch white and then she has a tingling sensation as they reperfuse. She's not aware of any family history of inflammatory arthritis. No recent appetite or weight changes  Past Medical History:  Diagnosis Date  . ADD 04/12/2006  . Anxiety   . ASTHMA 12/24/2009  . Depression   . IBS (irritable bowel syndrome)    NEGATIVE COLONSCOPY AND ENDOSCOPY  . INSOMNIA, TRANSIENT 12/24/2009  . PANIC ATTACK 12/24/2009  . Smoker    Past Surgical History:  Procedure Laterality Date  . COLONOSCOPY    . UPPER GASTROINTESTINAL ENDOSCOPY  2009    reports that she has been smoking Cigarettes.  She has a 3.50 pack-year smoking history. She has never used smokeless tobacco. She reports that she does not drink alcohol or use drugs. family history includes Alcohol abuse in her paternal uncle; Diabetes in her paternal grandfather; Heart disease in her paternal grandfather; Hyperlipidemia in her maternal grandfather; Hypertension in her paternal grandfather; Mental retardation in her maternal grandmother. Allergies  Allergen Reactions  . Prednisone Anaphylaxis  . Buspirone     "made her feel weird"  . Carafate [Sucralfate] Hives  . Meloxicam Other (See Comments)    GI upset     Review of  Systems  Constitutional: Negative for chills and fever.  Respiratory: Negative for shortness of breath.   Cardiovascular: Negative for chest pain.  Gastrointestinal: Negative for abdominal pain.  Musculoskeletal: Positive for arthralgias.  Skin: Negative for rash.  Hematological: Negative for adenopathy.       Objective:   Physical Exam  Constitutional: She appears well-developed and well-nourished.  Neck: Neck supple. No thyromegaly present.  Cardiovascular: Normal rate and regular rhythm.   Pulmonary/Chest: Effort normal and breath sounds normal. No respiratory distress. She has no wheezes. She has no rales.  Musculoskeletal: She exhibits no edema.       Assessment:     Polyarthralgias. Possible Raynaud's syndrome. No objective evidence for joint inflammation    Plan:     -Check labs -CBC, sedimentation rate, C-reactive protein, CCP antibody, antinuclear antibody, rheumatoid factor -Limited tramadol 50 mg #30 with no refill one every 6 hours as needed for severe pain  Kristian CoveyBruce W Atavia Poppe MD [facility]

## 2016-04-13 NOTE — Progress Notes (Signed)
Pre visit review using our clinic review tool, if applicable. No additional management support is needed unless otherwise documented below in the visit note. 

## 2016-04-14 LAB — CBC WITH DIFFERENTIAL/PLATELET
BASOS ABS: 0 10*3/uL (ref 0.0–0.1)
BASOS PCT: 0.5 % (ref 0.0–3.0)
EOS ABS: 0.1 10*3/uL (ref 0.0–0.7)
Eosinophils Relative: 1.3 % (ref 0.0–5.0)
HCT: 37.7 % (ref 36.0–46.0)
HEMOGLOBIN: 13 g/dL (ref 12.0–15.0)
LYMPHS PCT: 30.7 % (ref 12.0–46.0)
Lymphs Abs: 2.6 10*3/uL (ref 0.7–4.0)
MCHC: 34.4 g/dL (ref 30.0–36.0)
MCV: 90 fl (ref 78.0–100.0)
MONOS PCT: 5.3 % (ref 3.0–12.0)
Monocytes Absolute: 0.4 10*3/uL (ref 0.1–1.0)
NEUTROS ABS: 5.2 10*3/uL (ref 1.4–7.7)
Neutrophils Relative %: 62.2 % (ref 43.0–77.0)
PLATELETS: 216 10*3/uL (ref 150.0–400.0)
RBC: 4.19 Mil/uL (ref 3.87–5.11)
RDW: 12.7 % (ref 11.5–15.5)
WBC: 8.4 10*3/uL (ref 4.0–10.5)

## 2016-04-14 LAB — C-REACTIVE PROTEIN

## 2016-04-14 LAB — SEDIMENTATION RATE: Sed Rate: 1 mm/hr (ref 0–20)

## 2016-04-15 LAB — RHEUMATOID FACTOR

## 2016-04-15 LAB — ANA: ANA: NEGATIVE

## 2016-04-15 LAB — CYCLIC CITRUL PEPTIDE ANTIBODY, IGG

## 2016-04-20 ENCOUNTER — Telehealth: Payer: Self-pay

## 2016-04-20 NOTE — Telephone Encounter (Signed)
LMTCB    Patient Name: Leslie [name] Gender: Female DOB: [birth year] Age: [age] Y 5 M 27 D Return Phone Number: [phone] (Primary) Address: City/State/Zip: [place] Client [facility] Night - Client Client Site [facility] - Night Contact Type Call Who Is Calling Patient / Member / Family / Caregiver Call Type Triage / Clinical Caller Name Dot LanesKrista Relationship To Patient Self Return Phone Number [phone] (Primary) Chief Complaint Hand Pain Reason for Call Symptomatic / Request for Health Information Initial Comment Caller states that she saw the doctor had is having sever joint pain. She was negative for Lupus. Her wrist, elbow, and knee are buring, but today the fatty part on her thumb is hurting and she lost movement. She was proscribed Tramadol and wants to know if she can take more medicaiton.

## 2016-04-22 NOTE — Telephone Encounter (Signed)
LMTCB

## 2016-04-22 NOTE — Telephone Encounter (Signed)
Spoke with pt and she states that her wrist and arm pain are improving. She has been wearing a wrist brace and that has helped. Advised she can add ibuprofen or motrin to the tramadol as well as ice paks as needed. Pt voiced understanding. She is also asking for additional Tramadol as she is running out. She was having to take more due to increased pain.   Dr. Caryl NeverBurchette - Please advise. Thanks!

## 2016-04-24 NOTE — Telephone Encounter (Signed)
We have discussed in past .  Do not recommend chronic Ultram for her wrist/joint pain   We have offered ortho referral. I would again suggest ortho opinion if Ibuprofen not controlling her pain.

## 2016-04-26 NOTE — Telephone Encounter (Signed)
Spoke with pt and she states that she was fired from her job after asking time off for her depression. She is trying to get unemployment at this time and will call back to make OV to discuss her depression medications as well as referral to Dr Jason FilaBray.   Dr Caryl NeverBurchette - FYI  Thanks!

## 2016-04-26 NOTE — Telephone Encounter (Signed)
LMTCB

## 2016-05-16 ENCOUNTER — Other Ambulatory Visit: Payer: Self-pay | Admitting: Family Medicine

## 2016-05-16 MED ORDER — AMPHETAMINE-DEXTROAMPHETAMINE 20 MG PO TABS: 20.0000 mg | ORAL_TABLET | Freq: Two times a day (BID) | ORAL | 0 refills | Status: DC

## 2016-05-16 NOTE — Telephone Encounter (Signed)
Refill OK

## 2016-05-17 MED ORDER — AMPHETAMINE-DEXTROAMPHETAMINE 20 MG PO TABS: 20.0000 mg | ORAL_TABLET | Freq: Two times a day (BID) | ORAL | 0 refills | Status: DC

## 2016-05-17 NOTE — Addendum Note (Signed)
Addended by: Tempie HoistMCNEIL, Tangee Marszalek M on: 05/17/2016 08:05 AM   Modules accepted: Orders

## 2016-06-01 ENCOUNTER — Other Ambulatory Visit: Payer: Self-pay | Admitting: Family Medicine

## 2016-06-13 ENCOUNTER — Telehealth: Payer: Self-pay | Admitting: Family Medicine

## 2016-06-13 MED ORDER — TRAMADOL HCL 50 MG PO TABS: 50.0000 mg | ORAL_TABLET | Freq: Four times a day (QID) | ORAL | 0 refills | Status: DC | PRN

## 2016-06-13 NOTE — Telephone Encounter (Signed)
We can refill #20 Tramadol, but she needs to get GYN appt soon.

## 2016-06-13 NOTE — Telephone Encounter (Signed)
Pt following up on refill request. Pt states she is feeling pretty bad.

## 2016-06-13 NOTE — Telephone Encounter (Signed)
Rx called in and patient is aware 

## 2016-06-13 NOTE — Telephone Encounter (Signed)
Pt states she is having severe menstrual cramping.  Pt states her mom had endometriosis and she probably has too.  Does not have an OBGYN Pt would like to know if Dr Caryl NeverBurchette will prescribe hydrocodone or tramadol, Pt has no ride to come in today or tomorrow.  Can discuss in a few days if you need to see her, but really needs something for pain today.  Pt states the pain is so bad she cannot get out of bed.    [id number] [place], [place] - [address]

## 2016-06-20 ENCOUNTER — Ambulatory Visit (INDEPENDENT_AMBULATORY_CARE_PROVIDER_SITE_OTHER): Payer: Self-pay | Admitting: Family Medicine

## 2016-06-20 VITALS — BP 120/70 | HR 86 | Temp 98.7°F | Wt 104.5 lb

## 2016-06-20 DIAGNOSIS — B001 Herpesviral vesicular dermatitis: Secondary | ICD-10-CM

## 2016-06-20 MED ORDER — VALACYCLOVIR HCL 1 G PO TABS: ORAL_TABLET | ORAL | 2 refills | Status: DC

## 2016-06-20 MED ORDER — AMPHETAMINE-DEXTROAMPHETAMINE 20 MG PO TABS: 20.0000 mg | ORAL_TABLET | Freq: Two times a day (BID) | ORAL | 0 refills | Status: DC

## 2016-06-20 NOTE — Progress Notes (Signed)
Pre visit review using our clinic review tool, if applicable. No additional management support is needed unless otherwise documented below in the visit note. 

## 2016-06-20 NOTE — Patient Instructions (Signed)
Cold Sore  A cold sore, also called a fever blister, is a skin infection that causes small, fluid-filled sores to form inside of the mouth or on the lips, gums, nose, chin, or cheeks. Cold sores can spread to other parts of the body, such as the eyes or fingers. In some people with other medical conditions, cold sores can spread to multiple other body sites, including the genitals. Cold sores can be spread or passed from person to person (contagious) until the sores crust over completely.  What are the causes?  Cold sores are caused by the herpes simplex virus (HSV-1). HSV-1 is closely related to the virus that causes genital herpes (HSV-2), but these viruses are not the same. Once a person is infected with HSV-1, the virus remains permanently in the body.  HSV-1 is spread from person to person through close contact, such as through kissing, touching the affected area, or sharing personal items such as lip balm, razors, or eating utensils.  What increases the risk?  A cold sore outbreak is more likely to develop in people who:  · Are tired, stressed, or sick.  · Are menstruating.  · Are pregnant.  · Take certain medicines.  · Are exposed to cold weather or too much sun.    What are the signs or symptoms?  Symptoms of a cold sore outbreak often go through different stages. Here is how a cold sore develops:  · Tingling, itching, or burning is felt 1-2 days before the outbreak.  · Fluid-filled blisters appear on the lips, inside the mouth, on the nose, or on the cheeks.  · The blisters start to ooze clear fluid.  · The blisters dry up and a yellow crust appears in its place.  · The crust falls off.    Other symptoms include:  · Fever.  · Sore throat.  · Headache.  · Muscle aches.  · Swollen neck glands.    You also may not have any symptoms.  How is this diagnosed?  This condition is often diagnosed based on your medical history and a physical exam. Your health care provider may swab your sore and then examine it in  the lab. Rarely, blood tests may be done to check for HSV-1.  How is this treated?  There is no cure for cold sores or HSV-1. There also is no vaccine for HSV-1. Most cold sores go away on their own without treatment within two weeks. Medicines cannot make the infection go away, but medicines can:  · Help relieve some of the pain associated with the sores.  · Work to stop the virus from multiplying.  · Shorten healing time.    Medicines may be in the form of creams, gels, pills, or a shot.  Follow these instructions at home:  Medicines   · Take or apply over-the-counter and prescription medicines only as told by your health care provider.  · Use a cotton-tip swab to apply creams or gels to your sores.  Sore Care   · Do not touch the sores or pick the scabs.  · Wash your hands often. Do not touch your eyes without washing your hands first.  · Keep the sores clean and dry.  · If directed, apply ice to the sores:  ? Put ice in a plastic bag.  ? Place a towel between your skin and the bag.  ? Leave the ice on for 20 minutes, 2-3 times per day.  Lifestyle   · Do not   kiss, have oral sex, or share personal items until your sores heal.  · Eat a soft, bland diet. Avoid eating hot, cold, or salty foods. These can hurt your mouth.  · Use a straw if it hurts to drink out of a glass.  · Avoid the sun and limit your stress if these things trigger outbreaks. If sun causes cold sores, apply sunscreen on your lips before being out in the sun.  Contact a health care provider if:  · You have symptoms for more than two weeks.  · You have pus coming from the sores.  · You have redness that is spreading.  · You have pain or irritation in your eye.  · You get sores on your genitals.  · Your sores do not heal within two weeks.  · You have frequent cold sore outbreaks.  Get help right away if:  · You have a fever and your symptoms suddenly get worse.  · You have a headache and confusion.  This information is not intended to replace advice  given to you by your health care provider. Make sure you discuss any questions you have with your health care provider.  Document Released: 03/25/2000 Document Revised: 11/20/2015 Document Reviewed: 01/16/2015  Elsevier Interactive Patient Education © 2017 Elsevier Inc.

## 2016-06-20 NOTE — Progress Notes (Signed)
Subjective:     Patient ID: Leslie [name], female   DOB: 04-24-1990, 26 y.o.   MRN: [id number]  HPI   Patient seen with cold sore left upper lip. She's had cold sores in the past. Current sore started yesterday. She tried some Lysine without improvement. Also use some Campho-Phenique. She has some mild adenopathy left side of neck. No fever. No difficulties with eating.  Past Medical History:  Diagnosis Date  . ADD 04/12/2006  . Anxiety   . ASTHMA 12/24/2009  . Depression   . IBS (irritable bowel syndrome)    NEGATIVE COLONSCOPY AND ENDOSCOPY  . INSOMNIA, TRANSIENT 12/24/2009  . PANIC ATTACK 12/24/2009  . Smoker    Past Surgical History:  Procedure Laterality Date  . COLONOSCOPY    . UPPER GASTROINTESTINAL ENDOSCOPY  2009    reports that she has been smoking Cigarettes.  She has a 3.50 pack-year smoking history. She has never used smokeless tobacco. She reports that she does not drink alcohol or use drugs. family history includes Alcohol abuse in her paternal uncle; Diabetes in her paternal grandfather; Heart disease in her paternal grandfather; Hyperlipidemia in her maternal grandfather; Hypertension in her paternal grandfather; Mental retardation in her maternal grandmother. Allergies  Allergen Reactions  . Prednisone Anaphylaxis  . Buspirone     "made her feel weird"  . Carafate [Sucralfate] Hives  . Meloxicam Other (See Comments)    GI upset     Review of Systems  Constitutional: Negative for chills and fever.       Objective:   Physical Exam  Constitutional: She appears well-developed and well-nourished.  HENT:  Patient is fairly large cold sore left upper lip near the lateral corner. No intraoral lesions noted.  Neck:  Minimal left anterior cervical adenopathy  Cardiovascular: Normal rate and regular rhythm.        Assessment:     Cold sore left upper lip    Plan:     -Valtrex 1 g 2 tablets at onset of cold sore and repeat 2 tablets in 12 hours for  future cold sores  Leslie CoveyBruce W Leslie Monreal MD [facility]

## 2016-07-22 ENCOUNTER — Telehealth: Payer: Self-pay | Admitting: Family Medicine

## 2016-07-22 NOTE — Telephone Encounter (Signed)
Pt state that she has been having more frequent migraines and would like to see if Dr. Caryl Never remembers the medication that they spoke about for migraines if so she would like for him to call it in.  [facility] [address].  Pt refuse appt just would like for Dr. Caryl Never to please call her something in.

## 2016-07-22 NOTE — Telephone Encounter (Signed)
I don't feel comfortable just calling in without going over options and discussing risks/benefits of migraine prevention medications.

## 2016-07-22 NOTE — Telephone Encounter (Signed)
Left detailed message on machine for patient to call and schedule an appointment. 

## 2016-07-28 ENCOUNTER — Other Ambulatory Visit: Payer: Self-pay | Admitting: Family Medicine

## 2016-07-28 NOTE — Telephone Encounter (Signed)
Last refill given on 10/27 for #60 with 5 refills

## 2016-07-29 ENCOUNTER — Ambulatory Visit (INDEPENDENT_AMBULATORY_CARE_PROVIDER_SITE_OTHER): Payer: Self-pay | Admitting: Family Medicine

## 2016-07-29 ENCOUNTER — Encounter: Payer: Self-pay | Admitting: Family Medicine

## 2016-07-29 DIAGNOSIS — G43009 Migraine without aura, not intractable, without status migrainosus: Secondary | ICD-10-CM | POA: Insufficient documentation

## 2016-07-29 MED ORDER — SUMATRIPTAN SUCCINATE 100 MG PO TABS: 100.0000 mg | ORAL_TABLET | ORAL | 6 refills | Status: DC | PRN

## 2016-07-29 NOTE — Progress Notes (Signed)
Subjective:     Patient ID: Leslie [name], female   DOB: [birth year], 26 y.o.   MRN: [id number]  HPI Here to discuss possible migraine headaches. She has never been formally diagnosed but her mother does have history of migraine headaches. For the past several months she's had about 3 or 4 episodes per month. No clear consistency with her periods..  She has my headaches that frequently or occipital and somewhat of a throbbing quality. At least moderate severity. She has light and noise sensitivity. She occasionally has nausea but no vomiting. She's tried Tylenol and ibuprofen without much relief. Sleep helps somewhat. Symptoms can last up to 3 days. Not aware of any other specific triggers. Does drink a fair amount of caffeine. She does not describe any clear aura.  Past Medical History:  Diagnosis Date  . ADD 04/12/2006  . Anxiety   . ASTHMA 12/24/2009  . Depression   . IBS (irritable bowel syndrome)    NEGATIVE COLONSCOPY AND ENDOSCOPY  . INSOMNIA, TRANSIENT 12/24/2009  . PANIC ATTACK 12/24/2009  . Smoker    Past Surgical History:  Procedure Laterality Date  . COLONOSCOPY    . UPPER GASTROINTESTINAL ENDOSCOPY  2009    reports that she has been smoking Cigarettes.  She has a 3.50 pack-year smoking history. She has never used smokeless tobacco. She reports that she does not drink alcohol or use drugs. family history includes Alcohol abuse in her paternal uncle; Diabetes in her paternal grandfather; Heart disease in her paternal grandfather; Hyperlipidemia in her maternal grandfather; Hypertension in her paternal grandfather; Mental retardation in her maternal grandmother. Allergies  Allergen Reactions  . Prednisone Anaphylaxis  . Buspirone     "made her feel weird"  . Carafate [Sucralfate] Hives  . Meloxicam Other (See Comments)    GI upset     Review of Systems  Constitutional: Negative for fatigue.  Eyes: Negative for visual disturbance.  Respiratory: Negative for cough, chest  tightness, shortness of breath and wheezing.   Cardiovascular: Negative for chest pain, palpitations and leg swelling.  Neurological: Positive for headaches. Negative for dizziness, seizures, syncope, weakness and light-headedness.       Objective:   Physical Exam  Constitutional: She is oriented to person, place, and time. She appears well-developed and well-nourished.  Eyes: Pupils are equal, round, and reactive to light.  Neck: Neck supple. No JVD present. No thyromegaly present.  Cardiovascular: Normal rate and regular rhythm.  Exam reveals no gallop.   Pulmonary/Chest: Effort normal and breath sounds normal. No respiratory distress. She has no wheezes. She has no rales.  Musculoskeletal: She exhibits no edema.  Neurological: She is alert and oriented to person, place, and time. No cranial nerve deficit.       Assessment:     Headaches. By description, suspect migraines.    Plan:     -We discussed possible triggers to watch out for with handout given -Trial of Imitrex 100 mg one at onset of migraine and may repeat one in 2 hours as needed -Stable well-hydrated and get plenty of rest -Try to minimize caffeine intake. -Follow-up in one month to reassess. Consider preventative medications if she continues to have 4 or more per month.  Kristian Covey MD [facility] at [facility]

## 2016-07-29 NOTE — Patient Instructions (Signed)
Migraine Headache A migraine headache is an intense, throbbing pain on one side or both sides of the head. Migraines may also cause other symptoms, such as nausea, vomiting, and sensitivity to light and noise. What are the causes? Doing or taking certain things may also trigger migraines, such as:  Alcohol.  Smoking.  Medicines, such as: ? Medicine used to treat chest pain (nitroglycerine). ? Birth control pills. ? Estrogen pills. ? Certain blood pressure medicines.  Aged cheeses, chocolate, or caffeine.  Foods or drinks that contain nitrates, glutamate, aspartame, or tyramine.  Physical activity.  Other things that may trigger a migraine include:  Menstruation.  Pregnancy.  Hunger.  Stress, lack of sleep, too much sleep, or fatigue.  Weather changes.  What increases the risk? The following factors may make you more likely to experience migraine headaches:  Age. Risk increases with age.  Family history of migraine headaches.  Being Caucasian.  Depression and anxiety.  Obesity.  Being a woman.  Having a hole in the heart (patent foramen ovale) or other heart problems.  What are the signs or symptoms? The main symptom of this condition is pulsating or throbbing pain. Pain may:  Happen in any area of the head, such as on one side or both sides.  Interfere with daily activities.  Get worse with physical activity.  Get worse with exposure to bright lights or loud noises.  Other symptoms may include:  Nausea.  Vomiting.  Dizziness.  General sensitivity to bright lights, loud noises, or smells.  Before you get a migraine, you may get warning signs that a migraine is developing (aura). An aura may include:  Seeing flashing lights or having blind spots.  Seeing bright spots, halos, or zigzag lines.  Having tunnel vision or blurred vision.  Having numbness or a tingling feeling.  Having trouble talking.  Having muscle weakness.  How is this  diagnosed? A migraine headache can be diagnosed based on:  Your symptoms.  A physical exam.  Tests, such as CT scan or MRI of the head. These imaging tests can help rule out other causes of headaches.  Taking fluid from the spine (lumbar puncture) and analyzing it (cerebrospinal fluid analysis, or CSF analysis).  How is this treated? A migraine headache is usually treated with medicines that:  Relieve pain.  Relieve nausea.  Prevent migraines from coming back.  Treatment may also include:  Acupuncture.  Lifestyle changes like avoiding foods that trigger migraines.  Follow these instructions at home: Medicines  Take over-the-counter and prescription medicines only as told by your health care provider.  Do not drive or use heavy machinery while taking prescription pain medicine.  To prevent or treat constipation while you are taking prescription pain medicine, your health care provider may recommend that you: ? Drink enough fluid to keep your urine clear or pale yellow. ? Take over-the-counter or prescription medicines. ? Eat foods that are high in fiber, such as fresh fruits and vegetables, whole grains, and beans. ? Limit foods that are high in fat and processed sugars, such as fried and sweet foods. Lifestyle  Avoid alcohol use.  Do not use any products that contain nicotine or tobacco, such as cigarettes and e-cigarettes. If you need help quitting, ask your health care provider.  Get at least 8 hours of sleep every night.  Limit your stress. General instructions   Keep a journal to find out what may trigger your migraine headaches. For example, write down: ? What you eat and   drink. ? How much sleep you get. ? Any change to your diet or medicines.  If you have a migraine: ? Avoid things that make your symptoms worse, such as bright lights. ? It may help to lie down in a dark, quiet room. ? Do not drive or use heavy machinery. ? Ask your health care provider  what activities are safe for you while you are experiencing symptoms.  Keep all follow-up visits as told by your health care provider. This is important. Contact a health care provider if:  You develop symptoms that are different or more severe than your usual migraine symptoms. Get help right away if:  Your migraine becomes severe.  You have a fever.  You have a stiff neck.  You have vision loss.  Your muscles feel weak or like you cannot control them.  You start to lose your balance often.  You develop trouble walking.  You faint. This information is not intended to replace advice given to you by your health care provider. Make sure you discuss any questions you have with your health care provider. Document Released: 03/28/2005 Document Revised: 10/16/2015 Document Reviewed: 09/14/2015 Elsevier Interactive Patient Education  2017 Elsevier Inc.   

## 2016-07-29 NOTE — Telephone Encounter (Signed)
Refill for 3 months. 

## 2016-07-29 NOTE — Progress Notes (Signed)
Pre visit review using our clinic review tool, if applicable. No additional management support is needed unless otherwise documented below in the visit note. 

## 2016-08-23 ENCOUNTER — Other Ambulatory Visit: Payer: Self-pay | Admitting: Family Medicine

## 2016-08-23 NOTE — Telephone Encounter (Signed)
Rx done. 

## 2016-08-23 NOTE — Telephone Encounter (Signed)
Refill OK for 6 months. 

## 2016-08-25 ENCOUNTER — Other Ambulatory Visit: Payer: Self-pay | Admitting: Family Medicine

## 2016-08-25 NOTE — Telephone Encounter (Signed)
Last refill given on 3/5 for #20 with no ref

## 2016-08-26 NOTE — Telephone Encounter (Signed)
Rx denial sent to the pharmacy. I left a message for the pt to return my call.

## 2016-08-26 NOTE — Telephone Encounter (Signed)
Denied.  She is not on this regularly

## 2016-09-06 ENCOUNTER — Other Ambulatory Visit: Payer: Self-pay | Admitting: Family Medicine

## 2016-09-06 MED ORDER — AMPHETAMINE-DEXTROAMPHETAMINE 20 MG PO TABS: 20.0000 mg | ORAL_TABLET | Freq: Two times a day (BID) | ORAL | 0 refills | Status: DC

## 2016-09-07 ENCOUNTER — Telehealth: Payer: Self-pay

## 2016-09-07 NOTE — Telephone Encounter (Signed)
Patient Rx for ADDERAL 20 MG has been signed and ready for pick up in the office. Patient is aware.

## 2016-09-15 ENCOUNTER — Ambulatory Visit (INDEPENDENT_AMBULATORY_CARE_PROVIDER_SITE_OTHER): Payer: Self-pay | Admitting: Family Medicine

## 2016-09-15 ENCOUNTER — Encounter: Payer: Self-pay | Admitting: Family Medicine

## 2016-09-15 VITALS — BP 110/74 | HR 88 | Temp 98.2°F | Ht 64.0 in | Wt 100.6 lb

## 2016-09-15 DIAGNOSIS — G43009 Migraine without aura, not intractable, without status migrainosus: Secondary | ICD-10-CM

## 2016-09-15 MED ORDER — PROPRANOLOL HCL ER 60 MG PO CP24: 60.0000 mg | ORAL_CAPSULE | Freq: Every day | ORAL | 0 refills | Status: DC

## 2016-09-15 NOTE — Progress Notes (Signed)
Subjective:    Patient ID: Leslie [name], female    DOB: 10/09/1990, 26 y.o.   MRN: [id number]  HPI  Leslie [name] is a 26 year old female who is here today for I am seeing her in place of her PCP who is unavailable today. She was evaluated 07/29/16 for possible migraines and provided Imitrex 100 mg to try for relief. She was advised to stay well hydrated, rest, and limit caffeine intake and to follow up for reevaluation.  She reports having episodes of migraine headaches approximately 3 to 4 times/month and she does not report consistency with menstrual cycles.  Headaches are noted as occipital and throbbing. Associated mild nausea without vomiting and reports that symptoms can persist for up to 3 days.  She cannot identify any triggers and does not describe an aura. Photophobia and phonophobia are noted with migraines.  She reports having a headache on 09/13/16 with "seeing black spots and confusion" which took her to the hospital where she was evaluated. She reports that she had slurred speech during this time. She was evaluated with blood work and a CT scan which were unremarkable. Drug screen was negative with exception to amphetamine and benzos which she regularly takes and is prescribed. She was told she was likely dehydrated and symptoms have improved with hydration.  Hospital record reviewed and indicated that neurological exam was normal, speech was noted as slightly delayed which may be normal for her and she was discharged stating she felt good, walking and talking without difficulty.  Likely dehydration.  Today, she has a "mild headache" today rated between a 1 and 2.  She denies dizziness, "seeing black spots", or no clear aura reported. She is drinking Starbucks coffee in the morning and at least 3 Dr. Reino Kent sodas a day and reports that this can increase and she does have limited water intake. She has other possible triggers such as smoking, caffeine intake, dehydration, and  stress. She is taking clonazepam 1 to 1.5 daily or she does increase this to 2 tablets/day.  She denies fever, chills, sweats, N/V, photophobia, photophonia, dizziness, visual disturbances, numbness, tingling, weakness, or stiff neck. She is interested in seeking preventive treatment for symptoms.  Review of Systems  Constitutional: Negative for chills, fatigue and fever.  Respiratory: Negative for cough, shortness of breath and wheezing.   Cardiovascular: Negative for chest pain and palpitations.  Gastrointestinal: Negative for abdominal pain, diarrhea, nausea and vomiting.  Musculoskeletal: Negative for myalgias.  Skin: Negative for rash.  Neurological: Positive for headaches. Negative for dizziness, weakness, light-headedness and numbness.  Psychiatric/Behavioral:       Denies depressed or anxious mood   Past Medical History:  Diagnosis Date  . ADD 04/12/2006  . Anxiety   . ASTHMA 12/24/2009  . Depression   . IBS (irritable bowel syndrome)    NEGATIVE COLONSCOPY AND ENDOSCOPY  . INSOMNIA, TRANSIENT 12/24/2009  . PANIC ATTACK 12/24/2009  . Smoker      Social History   Social History  . Marital status: Single    Spouse name: N/A  . Number of children: 0  . Years of education: N/A   Occupational History  . Dog Groomer     [employer]   Social History Main Topics  . Smoking status: Current Every Day Smoker    Packs/day: 0.50    Years: 7.00    Types: Cigarettes  . Smokeless tobacco: Never Used  . Alcohol use No  . Drug use: No  .  Sexual activity: Yes    Birth control/ protection: Pill   Other Topics Concern  . Not on file   Social History Narrative  . No narrative on file    Past Surgical History:  Procedure Laterality Date  . COLONOSCOPY    . UPPER GASTROINTESTINAL ENDOSCOPY  2009    Family History  Problem Relation Age of Onset  . Alcohol abuse Paternal Uncle   . Mental retardation Maternal Grandmother   . Hyperlipidemia Maternal Grandfather   . Diabetes  Paternal Grandfather   . Hypertension Paternal Grandfather   . Heart disease Paternal Grandfather     Allergies  Allergen Reactions  . Prednisone Anaphylaxis  . Buspirone     "made her feel weird"  . Carafate [Sucralfate] Hives  . Meloxicam Other (See Comments)    GI upset    Current Outpatient Prescriptions on File Prior to Visit  Medication Sig Dispense Refill  . acetaminophen (TYLENOL) 500 MG tablet Take 1,000 mg by mouth every 6 (six) hours as needed for moderate pain.    Marland Kitchen amphetamine-dextroamphetamine (ADDERALL) 20 MG tablet Take 1 tablet (20 mg total) by mouth 2 (two) times daily. 60 tablet 0  . clonazePAM (KLONOPIN) 1 MG tablet TAKE 1 TABLET BY MOUTH TWICE A DAY AS NEEDED FOR ANXIETY 60 tablet 2  . JUNEL 1.5/30 1.5-30 MG-MCG tablet TAKE 1 TABLET BY MOUTH DAILY. 21 tablet 5  . Loperamide HCl (IMODIUM PO) Take by mouth. Takes ones tablet daily.    Marland Kitchen Lysine 1000 MG TABS Take by mouth.    . SUMAtriptan (IMITREX) 100 MG tablet Take 1 tablet (100 mg total) by mouth every 2 (two) hours as needed for migraine. May repeat in 2 hours if headache persists or recurs. 10 tablet 6   No current facility-administered medications on file prior to visit.     BP 110/74 (BP Location: Left Arm, Patient Position: Sitting, Cuff Size: Normal)   Pulse 88   Temp 98.2 F (36.8 C) (Oral)   Ht 5\' 4"  (1.626 m)   Wt 100 lb 9.6 oz (45.6 kg)   LMP 08/08/2016 (Approximate)   SpO2 99%   BMI 17.27 kg/m       Objective:   Physical Exam  Constitutional: She is oriented to person, place, and time.  Thin, optimally nourished female  Eyes: Pupils are equal, round, and reactive to light. No scleral icterus.  Neck: Neck supple.  Cardiovascular: Normal rate, regular rhythm and intact distal pulses.   Pulmonary/Chest: Effort normal and breath sounds normal. She has no wheezes. She has no rales.  Abdominal: Soft. Bowel sounds are normal. There is no tenderness.  Musculoskeletal: Normal range of motion.    Lymphadenopathy:    She has no cervical adenopathy.  Neurological: She is alert and oriented to person, place, and time. Coordination normal.  II-Visual fields grossly intact. III/IV/VI-Extraocular movements intact. Pupils reactive bilaterally. V/VII-Smile symmetric, equal eyebrow raise, facial sensation intact VIII- Hearing grossly intact XI-bilateral shoulder shrug XII-midline tongue extension Motor: 5/5 bilaterally with normal tone and bulk Cerebellar: Normal finger-to-nose  Romberg negative Ambulates with a coordinated gait  Skin: Skin is warm and dry. No rash noted.  Psychiatric: She has a normal mood and affect. Her behavior is normal. Judgment and thought content normal.        Assessment & Plan:  1. Migraine without aura and without status migrainosus, not intractable Greater than 4 migraines/month; neuro exam normal; we discussed options which are limited with her current medication regimen.  Consulted with Dr. Selena BattenKim who agreed with limited options and an option for a low dose of propranolol was decided with follow up with PCP in one month. Also discussed lifestyle interventions with this patient including hydration, decreasing caffeine intake slowly, and tobacco cessation. She agreed to follow up with PCP in 4 weeks and trial of medication. - propranolol ER (INDERAL LA) 60 MG 24 hr capsule; Take 1 capsule (60 mg total) by mouth daily.  Dispense: 30 capsule; Refill: 0  Roddie McJulia Trayvion Embleton, FNP-C

## 2016-09-15 NOTE — Patient Instructions (Addendum)
Please follow up with Dr. Caryl NeverBurchette in 4 weeks or sooner if needed.    Migraine Headache A migraine headache is a very strong throbbing pain on one side or both sides of your head. Migraines can also cause other symptoms. Talk with your doctor about what things may bring on (trigger) your migraine headaches. Follow these instructions at home: Medicines  Take over-the-counter and prescription medicines only as told by your doctor.  Do not drive or use heavy machinery while taking prescription pain medicine.  To prevent or treat constipation while you are taking prescription pain medicine, your doctor may recommend that you: ? Drink enough fluid to keep your pee (urine) clear or pale yellow. ? Take over-the-counter or prescription medicines. ? Eat foods that are high in fiber. These include fresh fruits and vegetables, whole grains, and beans. ? Limit foods that are high in fat and processed sugars. These include fried and sweet foods. Lifestyle  Avoid alcohol.  Do not use any products that contain nicotine or tobacco, such as cigarettes and e-cigarettes. If you need help quitting, ask your doctor.  Get at least 8 hours of sleep every night.  Limit your stress. General instructions   Keep a journal to find out what may bring on your migraines. For example, write down: ? What you eat and drink. ? How much sleep you get. ? Any change in what you eat or drink. ? Any change in your medicines.  If you have a migraine: ? Avoid things that make your symptoms worse, such as bright lights. ? It may help to lie down in a dark, quiet room. ? Do not drive or use heavy machinery. ? Ask your doctor what activities are safe for you.  Keep all follow-up visits as told by your doctor. This is important. Contact a doctor if:  You get a migraine that is different or worse than your usual migraines. Get help right away if:  Your migraine gets very bad.  You have a fever.  You have a  stiff neck.  You have trouble seeing.  Your muscles feel weak or like you cannot control them.  You start to lose your balance a lot.  You start to have trouble walking.  You pass out (faint). This information is not intended to replace advice given to you by your health care provider. Make sure you discuss any questions you have with your health care provider. Document Released: 01/05/2008 Document Revised: 10/16/2015 Document Reviewed: 09/14/2015 Elsevier Interactive Patient Education  2017 Elsevier Inc.   WE NOW OFFER   [facility] FAST TRACK!!!  SAME DAY Appointments for ACUTE CARE  Such as: Sprains, Injuries, cuts, abrasions, rashes, muscle pain, joint pain, back pain Colds, flu, sore throats, headache, allergies, cough, fever  Ear pain, sinus and eye infections Abdominal pain, nausea, vomiting, diarrhea, upset stomach Animal/insect bites  3 Easy Ways to Schedule: Walk-In Scheduling Call in scheduling Mychart Sign-up: [email or web address]

## 2016-10-28 ENCOUNTER — Other Ambulatory Visit: Payer: Self-pay | Admitting: Family Medicine

## 2016-10-31 NOTE — Telephone Encounter (Signed)
Last office visit and refill 07/29/16.  Okay to refill?

## 2016-10-31 NOTE — Telephone Encounter (Signed)
Refill for 3 months. 

## 2016-12-13 ENCOUNTER — Other Ambulatory Visit: Payer: Self-pay | Admitting: Family Medicine

## 2016-12-13 ENCOUNTER — Telehealth: Payer: Self-pay | Admitting: Family Medicine

## 2016-12-13 MED ORDER — SUMATRIPTAN SUCCINATE 100 MG PO TABS: 100.0000 mg | ORAL_TABLET | ORAL | 6 refills | Status: DC | PRN

## 2016-12-13 NOTE — Telephone Encounter (Signed)
Patient is requesting a refill for her Sumatriptan.  States that [facility].  Patient thought she was out of Refills when she last got it filled but it was written with 6 refills.  [facility] is calling [facility] but would like [facility] if possible.  Please call [phone] if you need any additional information from patient.

## 2016-12-29 ENCOUNTER — Encounter: Payer: Self-pay | Admitting: Family Medicine

## 2017-01-16 ENCOUNTER — Telehealth: Payer: Self-pay | Admitting: Family Medicine

## 2017-01-16 NOTE — Telephone Encounter (Signed)
Pt request refill  clonazePAM (KLONOPIN) 1 MG tablet  Pt states she needs by Monday 10/15 and is concerned with the weekend, she may not get it approved until it is too last   [id number] [place], [place] - [address]

## 2017-01-17 NOTE — Telephone Encounter (Signed)
Refill by Friday once and make sure pt gets follow up appt within 1-2 months to discuss.

## 2017-01-17 NOTE — Telephone Encounter (Signed)
Last OV: 09/15/16 w/ Roddie Mc Last filled: 10/31/16, #60, 2 RF Sig: TAKE 1 TABLET BY MOUTH TWICE A DAY AS NEEDED FOR ANXIETY UDS/CONTRACT: Not on file

## 2017-01-18 MED ORDER — CLONAZEPAM 1 MG PO TABS: ORAL_TABLET | ORAL | 0 refills | Status: DC

## 2017-01-18 NOTE — Telephone Encounter (Signed)
Rx faxed to pharmacy. Fax confirmation received. 

## 2017-01-18 NOTE — Telephone Encounter (Signed)
Rx printed, to PCP for signature.  Pt notified of instructions below and verbalized understanding. She will call back to schedule.

## 2017-01-24 ENCOUNTER — Other Ambulatory Visit: Payer: Self-pay | Admitting: Family Medicine

## 2017-01-24 NOTE — Telephone Encounter (Signed)
02/25/15 last pap.  Okay to refill?

## 2017-01-24 NOTE — Telephone Encounter (Signed)
Refill for 3 months but set up CPE next couple of months.

## 2017-02-17 ENCOUNTER — Other Ambulatory Visit: Payer: Self-pay | Admitting: Family Medicine

## 2017-02-18 NOTE — Telephone Encounter (Signed)
Refill once and set up follow up to discuss. 

## 2017-02-24 ENCOUNTER — Encounter: Payer: Self-pay | Admitting: Family Medicine

## 2017-02-24 ENCOUNTER — Ambulatory Visit (INDEPENDENT_AMBULATORY_CARE_PROVIDER_SITE_OTHER): Payer: BLUE CROSS/BLUE SHIELD | Admitting: Family Medicine

## 2017-02-24 VITALS — BP 100/68 | HR 100 | Temp 98.6°F | Wt 101.2 lb

## 2017-02-24 DIAGNOSIS — F410 Panic disorder [episodic paroxysmal anxiety] without agoraphobia: Secondary | ICD-10-CM | POA: Diagnosis not present

## 2017-02-24 MED ORDER — FLUOXETINE HCL 40 MG PO CAPS: 40.0000 mg | ORAL_CAPSULE | Freq: Every day | ORAL | 11 refills | Status: DC

## 2017-02-24 NOTE — Patient Instructions (Addendum)
Try to reduce the Klonopin by one half mg every 6-7 days until down to one mg daily and then let me know. Our goal is to try to get off the Klonopin and consider PRN use of Xanax. Give me some feedback in 3-4 weeks.

## 2017-02-24 NOTE — Progress Notes (Signed)
Subjective:     Patient ID: Leslie [name], female   DOB: 03-Jun-1990, 26 y.o.   MRN: [id number]  HPI Patient here to discuss anxiety issues. She has long-standing history of anxiety and panic disorder. Still has occasional breakthrough symptoms even on Prozac 20 mg daily. She's been for several years on Klonopin which she was placed on initially by psychiatry currently 1 mg twice daily. We wanted to bring her back in to discuss potential alternative to long-term benzodiazepine use especially with some recent data linking possible association with memory deficits with longer use benzos  She does feel the Prozac is helped some with her anxiety symptoms. She has discrete episodes of anxiety which sound consistent with panic disorder and sometimes more generalized anxiety symptoms are well. She's had counseling in the past.  Is currently getting out of the relationship and she feels that'll be a positive move for her.  Past Medical History:  Diagnosis Date  . ADD 04/12/2006  . Anxiety   . ASTHMA 12/24/2009  . Depression   . IBS (irritable bowel syndrome)    NEGATIVE COLONSCOPY AND ENDOSCOPY  . INSOMNIA, TRANSIENT 12/24/2009  . PANIC ATTACK 12/24/2009  . Smoker    Past Surgical History:  Procedure Laterality Date  . COLONOSCOPY    . UPPER GASTROINTESTINAL ENDOSCOPY  2009    reports that she has been smoking cigarettes.  She has a 3.50 pack-year smoking history. she has never used smokeless tobacco. She reports that she does not drink alcohol or use drugs. family history includes Alcohol abuse in her paternal uncle; Diabetes in her paternal grandfather; Heart disease in her paternal grandfather; Hyperlipidemia in her maternal grandfather; Hypertension in her paternal grandfather; Mental retardation in her maternal grandmother. Allergies  Allergen Reactions  . Prednisone Anaphylaxis  . Buspirone     "made her feel weird"  . Carafate [Sucralfate] Hives  . Meloxicam Other (See Comments)   GI upset     Review of Systems  Constitutional: Negative for appetite change and unexpected weight change.  Respiratory: Negative for shortness of breath.   Cardiovascular: Negative for chest pain.  Neurological: Negative for headaches.  Psychiatric/Behavioral: Negative for confusion and dysphoric mood. The patient is nervous/anxious.        Objective:   Physical Exam  Constitutional: She appears well-developed and well-nourished.  Cardiovascular: Normal rate and regular rhythm.  Psychiatric: She has a normal mood and affect. Her behavior is normal. Judgment and thought content normal.       Assessment:     Anxiety disorder. History of panic disorder    Plan:     -We recommend titrate Prozac up to 40 mg daily -We recommend she try to gradually taper back Klonopin and our goal is to eventually get her off this. -Might consider when necessary use of shorter acting benzodiazepine but again reiterated our goal is to get her off of regular benzodiazepines. -She will touch base with us in a few weeks to give feedback how the tapering of Klonopin is going  Kristian CoveyBruce W Burchette MD [facility]

## 2017-03-20 ENCOUNTER — Other Ambulatory Visit: Payer: Self-pay | Admitting: Family Medicine

## 2017-03-28 DIAGNOSIS — R05 Cough: Secondary | ICD-10-CM | POA: Diagnosis not present

## 2017-03-28 DIAGNOSIS — R52 Pain, unspecified: Secondary | ICD-10-CM | POA: Diagnosis not present

## 2017-03-28 DIAGNOSIS — J029 Acute pharyngitis, unspecified: Secondary | ICD-10-CM | POA: Diagnosis not present

## 2017-03-28 DIAGNOSIS — R0982 Postnasal drip: Secondary | ICD-10-CM | POA: Diagnosis not present

## 2017-04-07 ENCOUNTER — Other Ambulatory Visit: Payer: Self-pay | Admitting: Family Medicine

## 2017-04-07 NOTE — Telephone Encounter (Signed)
Last refill given on 11/12 #60 with no ref

## 2017-04-08 NOTE — Telephone Encounter (Signed)
As per recent visit, we had recommended that she be tapering back the Klonopin.  Would decrease Klonopin to 0.5 mg po bid and refill for two months.  Make sure she has office follow up in 2 months to reassess.

## 2017-04-10 NOTE — Telephone Encounter (Signed)
Pt is returning jo ann call and she is aware office is close until Wednesday 04-13-17

## 2017-04-10 NOTE — Telephone Encounter (Signed)
I left a message for the pt to return my call. 

## 2017-04-12 ENCOUNTER — Telehealth: Payer: Self-pay | Admitting: Family Medicine

## 2017-04-12 NOTE — Telephone Encounter (Signed)
Copied from CRM [phone]. Topic: Inquiry >> Apr 12, 2017 12:54 PM Leslie [name], Andra M wrote: Reason for CRM: Pateint called requesting a refill of clonazePAM (KLONOPIN) 1 MG tablet. [id number] [place], [place] - [address] [phone] (Phone).       Thank You!!! [phone] (Fax)  .

## 2017-04-12 NOTE — Telephone Encounter (Signed)
Pt    Requesting  Refill  Of  Klonopin      Lov   02/24/2017

## 2017-04-13 MED ORDER — CLONAZEPAM 0.5 MG PO TABS: 0.5000 mg | ORAL_TABLET | Freq: Two times a day (BID) | ORAL | 1 refills | Status: DC | PRN

## 2017-04-13 NOTE — Addendum Note (Signed)
Addended by: Kern ReapVEREEN, Pallavi Clifton B on: 04/13/2017 10:37 AM   Modules accepted: Orders

## 2017-04-13 NOTE — Telephone Encounter (Signed)
Refill has been called in

## 2017-04-13 NOTE — Telephone Encounter (Signed)
Message for Klonopin discussed with pt.  Sending back to provider to order Klonopin and dose change  [facility] [address] Pt to check calendar and will call back to make appt for med check in 2 months

## 2017-04-13 NOTE — Telephone Encounter (Signed)
Left message on machine for patient to return our call.  As per recent visit, we had recommended that she be tapering back the Klonopin.  Would decrease Klonopin to 0.5 mg po bid and refill for two months.  Make sure she has office follow up in 2 months to reassess.

## 2017-04-26 ENCOUNTER — Ambulatory Visit (INDEPENDENT_AMBULATORY_CARE_PROVIDER_SITE_OTHER): Payer: BLUE CROSS/BLUE SHIELD | Admitting: Family Medicine

## 2017-04-26 ENCOUNTER — Encounter: Payer: Self-pay | Admitting: Family Medicine

## 2017-04-26 VITALS — BP 100/64 | HR 110 | Temp 98.3°F | Wt 106.1 lb

## 2017-04-26 DIAGNOSIS — J0100 Acute maxillary sinusitis, unspecified: Secondary | ICD-10-CM

## 2017-04-26 MED ORDER — AZITHROMYCIN 250 MG PO TABS: ORAL_TABLET | ORAL | 0 refills | Status: AC

## 2017-04-26 MED ORDER — HYDROCODONE-HOMATROPINE 5-1.5 MG/5ML PO SYRP: 5.0000 mL | ORAL_SOLUTION | Freq: Four times a day (QID) | ORAL | 0 refills | Status: AC | PRN

## 2017-04-26 NOTE — Progress Notes (Signed)
Subjective:     Patient ID: Leslie [name], female   DOB: 1991-01-09, 27 y.o.   MRN: [id number]  HPI Patient seen with acute respiratory illness. Onset last week. She felt somewhat better by the weekend and over the past couple has felt worse. Increased cough especially at night. She has sore throat. She's having bilateral maxillary pressure and some yellowish nasal discharge. Possible low-grade fever. No nausea or vomiting.  She tried over-the-counter Delsym cough syrup without much relief. Does have history of smoking about half pack cigarettes per day  Past Medical History:  Diagnosis Date  . ADD 04/12/2006  . Anxiety   . ASTHMA 12/24/2009  . Depression   . IBS (irritable bowel syndrome)    NEGATIVE COLONSCOPY AND ENDOSCOPY  . INSOMNIA, TRANSIENT 12/24/2009  . PANIC ATTACK 12/24/2009  . Smoker    Past Surgical History:  Procedure Laterality Date  . COLONOSCOPY    . UPPER GASTROINTESTINAL ENDOSCOPY  2009    reports that she has been smoking cigarettes.  She has a 3.50 pack-year smoking history. she has never used smokeless tobacco. She reports that she does not drink alcohol or use drugs. family history includes Alcohol abuse in her paternal uncle; Diabetes in her paternal grandfather; Heart disease in her paternal grandfather; Hyperlipidemia in her maternal grandfather; Hypertension in her paternal grandfather; Mental retardation in her maternal grandmother. Allergies  Allergen Reactions  . Prednisone Anaphylaxis  . Buspirone     "made her feel weird"  . Carafate [Sucralfate] Hives  . Meloxicam Other (See Comments)    GI upset     Review of Systems  Constitutional: Positive for chills, fatigue and fever.  HENT: Positive for sinus pressure, sinus pain and sore throat.   Respiratory: Positive for cough.        Objective:   Physical Exam  Constitutional: She appears well-developed and well-nourished.  HENT:  Right Ear: External ear normal.  Left Ear: External ear  normal.  Mouth/Throat: Oropharynx is clear and moist.  Neck: Neck supple.  Cardiovascular: Regular rhythm.  Pulmonary/Chest: Effort normal and breath sounds normal. No respiratory distress. She has no wheezes. She has no rales.  Lymphadenopathy:    She has no cervical adenopathy.       Assessment:     Upper respiratory infection. She's had progressive cough and bilateral maxillary sinusitis symptoms.    Plan:     -Given the fact she was feeling better and now worse we decided to go ahead and start Zithromax for 5 days -Hycodan cough syrup 1 teaspoon daily at bedtime for severe cough -Follow-up for any dyspnea or increasing fever or other concern -Work note was written for today and tomorrow  Kristian CoveyBruce W Tabb Croghan MD [facility]

## 2017-04-26 NOTE — Patient Instructions (Signed)
Follow up for any shortness of breath or other concerns.

## 2017-05-01 ENCOUNTER — Encounter: Payer: Self-pay | Admitting: Family Medicine

## 2017-05-09 ENCOUNTER — Encounter: Payer: Self-pay | Admitting: Family Medicine

## 2017-05-09 MED ORDER — SUMATRIPTAN SUCCINATE 100 MG PO TABS: ORAL_TABLET | ORAL | 0 refills | Status: DC

## 2017-05-21 ENCOUNTER — Other Ambulatory Visit: Payer: Self-pay | Admitting: Family Medicine

## 2017-05-23 NOTE — Telephone Encounter (Signed)
Last refill 09/06/16 and last office visit 04/26/17.  Okay to fill?

## 2017-05-23 NOTE — Telephone Encounter (Signed)
Refill OK

## 2017-05-24 MED ORDER — AMPHETAMINE-DEXTROAMPHETAMINE 20 MG PO TABS: 20.0000 mg | ORAL_TABLET | Freq: Two times a day (BID) | ORAL | 0 refills | Status: DC

## 2017-05-24 NOTE — Telephone Encounter (Signed)
rx ready for pick up and patient is aware  

## 2017-05-29 ENCOUNTER — Other Ambulatory Visit: Payer: Self-pay | Admitting: Family Medicine

## 2017-05-29 MED ORDER — SUMATRIPTAN SUCCINATE 100 MG PO TABS: ORAL_TABLET | ORAL | 0 refills | Status: DC

## 2017-06-02 ENCOUNTER — Ambulatory Visit: Payer: Self-pay | Admitting: Family Medicine

## 2017-06-02 DIAGNOSIS — Z0289 Encounter for other administrative examinations: Secondary | ICD-10-CM

## 2017-06-06 ENCOUNTER — Other Ambulatory Visit: Payer: Self-pay | Admitting: Family Medicine

## 2017-06-06 DIAGNOSIS — J4531 Mild persistent asthma with (acute) exacerbation: Secondary | ICD-10-CM | POA: Diagnosis not present

## 2017-06-06 DIAGNOSIS — J019 Acute sinusitis, unspecified: Secondary | ICD-10-CM | POA: Diagnosis not present

## 2017-06-06 DIAGNOSIS — R05 Cough: Secondary | ICD-10-CM | POA: Diagnosis not present

## 2017-06-06 DIAGNOSIS — J9811 Atelectasis: Secondary | ICD-10-CM | POA: Diagnosis not present

## 2017-06-13 ENCOUNTER — Ambulatory Visit (INDEPENDENT_AMBULATORY_CARE_PROVIDER_SITE_OTHER): Payer: BLUE CROSS/BLUE SHIELD | Admitting: Family Medicine

## 2017-06-13 ENCOUNTER — Encounter: Payer: Self-pay | Admitting: *Deleted

## 2017-06-13 ENCOUNTER — Encounter: Payer: Self-pay | Admitting: Family Medicine

## 2017-06-13 ENCOUNTER — Other Ambulatory Visit (HOSPITAL_COMMUNITY)
Admission: RE | Admit: 2017-06-13 | Discharge: 2017-06-13 | Disposition: A | Discharge status: Home / Self Care | Payer: BLUE CROSS/BLUE SHIELD | Source: Ambulatory Visit | Attending: Family Medicine | Admitting: Family Medicine

## 2017-06-13 VITALS — BP 110/70 | HR 100 | Temp 98.0°F | Wt 112.0 lb

## 2017-06-13 DIAGNOSIS — N912 Amenorrhea, unspecified: Secondary | ICD-10-CM | POA: Diagnosis not present

## 2017-06-13 DIAGNOSIS — N946 Dysmenorrhea, unspecified: Secondary | ICD-10-CM | POA: Diagnosis not present

## 2017-06-13 DIAGNOSIS — Z113 Encounter for screening for infections with a predominantly sexual mode of transmission: Secondary | ICD-10-CM | POA: Diagnosis not present

## 2017-06-13 LAB — POCT URINALYSIS DIPSTICK
BILIRUBIN UA: NEGATIVE
GLUCOSE UA: NEGATIVE
KETONES UA: NEGATIVE
Leukocytes, UA: NEGATIVE
Nitrite, UA: NEGATIVE
Protein, UA: NEGATIVE
RBC UA: NEGATIVE
SPEC GRAV UA: 1.01 (ref 1.010–1.025)
UROBILINOGEN UA: 0.2 U/dL
pH, UA: 7 (ref 5.0–8.0)

## 2017-06-13 LAB — TSH: TSH: 0.97 u[IU]/mL (ref 0.35–4.50)

## 2017-06-13 LAB — POCT URINE PREGNANCY: Preg Test, Ur: NEGATIVE

## 2017-06-13 MED ORDER — HYDROCODONE-ACETAMINOPHEN 5-325 MG PO TABS: 1.0000 | ORAL_TABLET | Freq: Four times a day (QID) | ORAL | 0 refills | Status: DC | PRN

## 2017-06-13 NOTE — Patient Instructions (Signed)
Dysmenorrhea °Menstrual cramps (dysmenorrhea) are caused by the muscles of the uterus tightening (contracting) during a menstrual period. For some women, this discomfort is merely bothersome. For others, dysmenorrhea can be severe enough to interfere with everyday activities for a few days each month. °Primary dysmenorrhea is menstrual cramps that last a couple of days when you start having menstrual periods or soon after. This often begins after a teenager starts having her period. As a woman gets older or has a baby, the cramps will usually lessen or disappear. Secondary dysmenorrhea begins later in life, lasts longer, and the pain may be stronger than primary dysmenorrhea. The pain may start before the period and last a few days after the period. °What are the causes? °Dysmenorrhea is usually caused by an underlying problem, such as: °· The tissue lining the uterus grows outside of the uterus in other areas of the body (endometriosis). °· The endometrial tissue, which normally lines the uterus, is found in or grows into the muscular walls of the uterus (adenomyosis). °· The pelvic blood vessels are engorged with blood just before the menstrual period (pelvic congestive syndrome). °· Overgrowth of cells (polyps) in the lining of the uterus or cervix. °· Falling down of the uterus (prolapse) because of loose or stretched ligaments. °· Depression. °· Bladder problems, infection, or inflammation. °· Problems with the intestine, a tumor, or irritable bowel syndrome. °· Cancer of the female organs or bladder. °· A severely tipped uterus. °· A very tight opening or closed cervix. °· Noncancerous tumors of the uterus (fibroids). °· Pelvic inflammatory disease (PID). °· Pelvic scarring (adhesions) from a previous surgery. °· Ovarian cyst. °· An intrauterine device (IUD) used for birth control. °What increases the risk? °You may be at greater risk of dysmenorrhea if: °· You are younger than age 30. °· You started puberty  early. °· You have irregular or heavy bleeding. °· You have never given birth. °· You have a family history of this problem. °· You are a smoker. °What are the signs or symptoms? °· Cramping or throbbing pain in your lower abdomen. °· Headaches. °· Lower back pain. °· Nausea or vomiting. °· Diarrhea. °· Sweating or dizziness. °· Loose stools. °How is this diagnosed? °A diagnosis is based on your history, symptoms, physical exam, diagnostic tests, or procedures. Diagnostic tests or procedures may include: °· Blood tests. °· Ultrasonography. °· An examination of the lining of the uterus (dilation and curettage, D&C). °· An examination inside your abdomen or pelvis with a scope (laparoscopy). °· X-rays. °· CT scan. °· MRI. °· An examination inside the bladder with a scope (cystoscopy). °· An examination inside the intestine or stomach with a scope (colonoscopy, gastroscopy). °How is this treated? °Treatment depends on the cause of the dysmenorrhea. Treatment may include: °· Pain medicine prescribed by your health care provider. °· Birth control pills or an IUD with progesterone hormone in it. °· Hormone replacement therapy. °· Nonsteroidal anti-inflammatory drugs (NSAIDs). These may help stop the production of prostaglandins. °· Surgery to remove adhesions, endometriosis, ovarian cyst, or fibroids. °· Removal of the uterus (hysterectomy). °· Progesterone shots to stop the menstrual period. °· Cutting the nerves on the sacrum that go to the female organs (presacral neurectomy). °· Electric current to the sacral nerves (sacral nerve stimulation). °· Antidepressant medicine. °· Psychiatric therapy, counseling, or group therapy. °· Exercise and physical therapy. °· Meditation and yoga therapy. °· Acupuncture. °Follow these instructions at home: °· Only take over-the-counter or prescription medicines as directed   by your health care provider. °· Place a heating pad or hot water bottle on your lower back or abdomen. Do not  sleep with the heating pad. °· Use aerobic exercises, walking, swimming, biking, and other exercises to help lessen the cramping. °· Massage to the lower back or abdomen may help. °· Stop smoking. °· Avoid alcohol and caffeine. °Contact a health care provider if: °· Your pain does not get better with medicine. °· You have pain with sexual intercourse. °· Your pain increases and is not controlled with medicines. °· You have abnormal vaginal bleeding with your period. °· You develop nausea or vomiting with your period that is not controlled with medicine. °Get help right away if: °You pass out. °This information is not intended to replace advice given to you by your health care provider. Make sure you discuss any questions you have with your health care provider. °Document Released: 03/28/2005 Document Revised: 09/03/2015 Document Reviewed: 09/13/2012 °Elsevier Interactive Patient Education © 2017 Elsevier Inc. ° °

## 2017-06-13 NOTE — Progress Notes (Signed)
Subjective:     Patient ID: Leslie [name], female   DOB: [birth year], 27 y.o.   MRN: [id number]  HPI Patient seen with suprapubic cramping and some nausea without vomiting for the past couple days. She's had similar dysmenorrhea in the past. She has long history of irregular menses and generally has spotting about every 5 or 6 months. She just started having some mild spotting yesterday. Denies any fevers or chills. No vaginal discharge. She does have history of IBS but states these symptoms are different. She's tried some heat which provides slight relief. She's had intolerance of nonsteroidals in the past or poor relief. No relief with Tylenol.  Her mother has history of endometriosis. Patient  Has seen gynecologist the past but not definitively diagnosed. She is currently on birth control and takes this regularly. She denies skipping any. She has one partner who she has been with for over a year. No barrier protection. No history of STDs. No fevers or chills. Denies any dysuria  Past Medical History:  Diagnosis Date  . ADD 04/12/2006  . Anxiety   . ASTHMA 12/24/2009  . Depression   . IBS (irritable bowel syndrome)    NEGATIVE COLONSCOPY AND ENDOSCOPY  . INSOMNIA, TRANSIENT 12/24/2009  . PANIC ATTACK 12/24/2009  . Smoker    Past Surgical History:  Procedure Laterality Date  . COLONOSCOPY    . UPPER GASTROINTESTINAL ENDOSCOPY  2009    reports that she has been smoking cigarettes.  She has a 3.50 pack-year smoking history. she has never used smokeless tobacco. She reports that she does not drink alcohol or use drugs. family history includes Alcohol abuse in her paternal uncle; Diabetes in her paternal grandfather; Heart disease in her paternal grandfather; Hyperlipidemia in her maternal grandfather; Hypertension in her paternal grandfather; Mental retardation in her maternal grandmother. Allergies  Allergen Reactions  . Prednisone Anaphylaxis  . Buspirone     "made her feel weird"  .  Carafate [Sucralfate] Hives  . Meloxicam Other (See Comments)    GI upset     Review of Systems  Constitutional: Negative for chills and fever.  Gastrointestinal: Positive for diarrhea. Negative for abdominal pain and blood in stool.  Genitourinary: Positive for pelvic pain. Negative for dysuria, flank pain, hematuria and urgency.       Objective:   Physical Exam  Constitutional: She appears well-developed and well-nourished.  Cardiovascular: Normal rate and regular rhythm.  Pulmonary/Chest: Effort normal and breath sounds normal. No respiratory distress. She has no wheezes. She has no rales.  Abdominal: Soft. Bowel sounds are normal. She exhibits no distension and no mass. There is no tenderness. There is no rebound and no guarding.       Assessment:     Dysmenorrhea. Patient relates irregular menses which have gone on for a couple years now and painful menstrual cramping    Plan:     -Urine pregnancy test= negative -Urine dipstick unremarkable -We discussed the importance of nonsteroidal use but she has tried these without any benefit (which does raise ? Of possible endometriosis). -Continue topical heat -Continue oral contraception -Very limited hydrocodone 5 mg 1-2 every 6 hours for severe pain -Consider GYN consult if painful menses, irregular menses persist -Check TSH and prolactin level along with HIV and RPR. Urine also sent for GC and Chlamydia  Leslie CoveyBruce W [name] MD [facility]

## 2017-06-14 LAB — URINE CYTOLOGY ANCILLARY ONLY
CHLAMYDIA, DNA PROBE: NEGATIVE
Neisseria Gonorrhea: NEGATIVE

## 2017-06-14 LAB — HIV ANTIBODY (ROUTINE TESTING W REFLEX): HIV: NONREACTIVE

## 2017-06-14 LAB — RPR: RPR: NONREACTIVE

## 2017-06-14 LAB — PROLACTIN: Prolactin: 8.7 ng/mL

## 2017-06-20 ENCOUNTER — Other Ambulatory Visit: Payer: Self-pay | Admitting: Family Medicine

## 2017-06-20 MED ORDER — SUMATRIPTAN SUCCINATE 100 MG PO TABS: ORAL_TABLET | ORAL | 0 refills | Status: DC

## 2017-06-21 ENCOUNTER — Other Ambulatory Visit: Payer: Self-pay | Admitting: Family Medicine

## 2017-06-22 ENCOUNTER — Telehealth: Payer: Self-pay | Admitting: Family Medicine

## 2017-06-22 NOTE — Telephone Encounter (Signed)
Medication not listed on pts current med list-is this OK to refill?

## 2017-06-22 NOTE — Telephone Encounter (Signed)
Pt hoping another provider can take care of this today since she is having early symptoms of a cold sore. Even if its only one day supply until Dr. Caryl NeverBurchette gets back in the office to approve a full prescription.

## 2017-06-22 NOTE — Telephone Encounter (Signed)
This medication has not been prescribed in >1 year so unable to refill per protocol. She will have to wait on Dr. Caryl NeverBurchette to return to the office tomorrow for approval. Pt can use OTC treatment until provider returns to office.

## 2017-06-22 NOTE — Telephone Encounter (Signed)
Prescription d/c'd 03/14/14; attempted to contact pt regarding symptoms; left message on voice mail [phone]; will route to office for final disposition.

## 2017-06-22 NOTE — Telephone Encounter (Signed)
Copied from CRM [id number]. Topic: Quick Communication - Rx Refill/Question >> Jun 22, 2017  8:25 AM Guinevere FerrariMorris, Welton Bord E, NT wrote: Medication: Valtrex 1 gram. Patient has symptoms   Has the patient contacted their pharmacy? Yes    (Agent: If no, request that the patient contact the pharmacy for the refill.)   Preferred Pharmacy (with phone number or street name): [id number] [place]  [address] [phone] ([phone] (Fax)     Agent: Please be advised that RX refills may take up to 3 business days. We ask that you follow-up with your pharmacy.

## 2017-06-22 NOTE — Telephone Encounter (Signed)
Yes. Thanks 

## 2017-06-23 ENCOUNTER — Encounter: Payer: Self-pay | Admitting: *Deleted

## 2017-06-23 MED ORDER — VALACYCLOVIR HCL 1 G PO TABS: ORAL_TABLET | ORAL | 1 refills | Status: DC

## 2017-06-23 NOTE — Addendum Note (Signed)
Addended by: Kern ReapVEREEN, Belen Zwahlen B on: 06/23/2017 10:29 AM   Modules accepted: Orders

## 2017-06-23 NOTE — Telephone Encounter (Signed)
Refill Valtrex 1 gm- take two at onset of cold sore and repeat two in 12 hours.  Disp# 30 with one refill.

## 2017-06-23 NOTE — Telephone Encounter (Signed)
Rx done. 

## 2017-07-05 ENCOUNTER — Other Ambulatory Visit: Payer: Self-pay | Admitting: Family Medicine

## 2017-07-05 MED ORDER — SUMATRIPTAN SUCCINATE 100 MG PO TABS: ORAL_TABLET | ORAL | 0 refills | Status: DC

## 2017-07-15 ENCOUNTER — Other Ambulatory Visit: Payer: Self-pay | Admitting: Family Medicine

## 2017-07-17 ENCOUNTER — Other Ambulatory Visit: Payer: Self-pay | Admitting: Family Medicine

## 2017-07-18 ENCOUNTER — Encounter: Payer: Self-pay | Admitting: Family Medicine

## 2017-07-19 NOTE — Telephone Encounter (Signed)
Last refill 04/13/17 and last office visit 06/13/17  Okay to fill?

## 2017-07-19 NOTE — Telephone Encounter (Signed)
Okay for refill?  

## 2017-07-22 ENCOUNTER — Other Ambulatory Visit: Payer: Self-pay | Admitting: Family Medicine

## 2017-07-24 NOTE — Telephone Encounter (Signed)
Refill for 3 months.  She needs follow up pap this year- set up in next few months.

## 2017-07-24 NOTE — Telephone Encounter (Signed)
Last pap was 02/25/15.  Okay to fill?

## 2017-07-26 ENCOUNTER — Other Ambulatory Visit: Payer: Self-pay | Admitting: Family Medicine

## 2017-07-26 MED ORDER — SUMATRIPTAN SUCCINATE 100 MG PO TABS: ORAL_TABLET | ORAL | 0 refills | Status: DC

## 2017-07-26 NOTE — Telephone Encounter (Signed)
Refill OK

## 2017-07-26 NOTE — Telephone Encounter (Signed)
Physical due.  Okay to fill?

## 2017-08-08 DIAGNOSIS — R55 Syncope and collapse: Secondary | ICD-10-CM | POA: Diagnosis not present

## 2017-08-11 ENCOUNTER — Other Ambulatory Visit: Payer: Self-pay | Admitting: Family Medicine

## 2017-08-11 MED ORDER — SUMATRIPTAN SUCCINATE 100 MG PO TABS: ORAL_TABLET | ORAL | 0 refills | Status: DC

## 2017-08-11 NOTE — Telephone Encounter (Signed)
Refills OK. 

## 2017-08-14 ENCOUNTER — Encounter: Payer: Self-pay | Admitting: Family Medicine

## 2017-08-14 ENCOUNTER — Ambulatory Visit (INDEPENDENT_AMBULATORY_CARE_PROVIDER_SITE_OTHER): Payer: BLUE CROSS/BLUE SHIELD | Admitting: Family Medicine

## 2017-08-14 VITALS — BP 120/80 | HR 125 | Temp 98.4°F | Wt 104.3 lb

## 2017-08-14 DIAGNOSIS — R404 Transient alteration of awareness: Secondary | ICD-10-CM | POA: Diagnosis not present

## 2017-08-14 DIAGNOSIS — R55 Syncope and collapse: Secondary | ICD-10-CM

## 2017-08-14 LAB — COMPREHENSIVE METABOLIC PANEL
ALT: 23 U/L (ref 0–35)
AST: 20 U/L (ref 0–37)
Albumin: 4.6 g/dL (ref 3.5–5.2)
Alkaline Phosphatase: 40 U/L (ref 39–117)
BILIRUBIN TOTAL: 0.5 mg/dL (ref 0.2–1.2)
BUN: 11 mg/dL (ref 6–23)
CHLORIDE: 102 meq/L (ref 96–112)
CO2: 26 meq/L (ref 19–32)
Calcium: 10.2 mg/dL (ref 8.4–10.5)
Creatinine, Ser: 0.77 mg/dL (ref 0.40–1.20)
GFR: 95 mL/min (ref >60.00)
GLUCOSE: 85 mg/dL (ref 70–99)
Potassium: 4.3 mEq/L (ref 3.5–5.1)
Sodium: 139 mEq/L (ref 135–145)
Total Protein: 7.1 g/dL (ref 6.0–8.3)

## 2017-08-14 LAB — CBC WITH DIFFERENTIAL/PLATELET
BASOS ABS: 0.1 10*3/uL (ref 0.0–0.1)
Basophils Relative: 0.7 % (ref 0.0–3.0)
EOS ABS: 0.1 10*3/uL (ref 0.0–0.7)
Eosinophils Relative: 1.4 % (ref 0.0–5.0)
HCT: 40.1 % (ref 36.0–46.0)
Hemoglobin: 13.9 g/dL (ref 12.0–15.0)
LYMPHS ABS: 2 10*3/uL (ref 0.7–4.0)
Lymphocytes Relative: 28.2 % (ref 12.0–46.0)
MCHC: 34.7 g/dL (ref 30.0–36.0)
MCV: 93 fl (ref 78.0–100.0)
MONOS PCT: 6.7 % (ref 3.0–12.0)
Monocytes Absolute: 0.5 10*3/uL (ref 0.1–1.0)
NEUTROS ABS: 4.5 10*3/uL (ref 1.4–7.7)
Neutrophils Relative %: 63 % (ref 43.0–77.0)
Platelets: 226 10*3/uL (ref 150.0–400.0)
RBC: 4.32 Mil/uL (ref 3.87–5.11)
RDW: 12.9 % (ref 11.5–15.5)
WBC: 7.1 10*3/uL (ref 4.0–10.5)

## 2017-08-14 NOTE — Progress Notes (Signed)
Subjective:     Patient ID: Leslie [name], female   DOB: 09-22-1990, 27 y.o.   MRN: [id number]  HPI Patient seen following reported syncopal episode this past Tuesday at work. She states she had a headache that morning but was not sure if it represented migraine type headache. She recalls having some mild nausea without vomiting and lightheadedness. She became progressively dizzy and according to witnesses her eyes "rolled back" and some co-workers caught her to keep her from falling down. She was later told that she was "convulsing" involving the head neck and upper extremities. Reported loss of consciousness for about 30 minutes. EMS was called. She is not sure but thinks that her blood sugars down in the 50s. No hx of diabetes.  She had no urine or stool incontinence. Patient was not taken in for further evaluation.  She states that it took at least a couple hours before she felt back to baseline. She's not had any recent fever. No focal weakness. Denies alcohol use. Denies illicit drug use.  Patient had sleep deprived EEG back in 2017 and also had MRI of the brain 2017 with no acute normality. She denies any recurrent episodes of altered consciousness since last Tuesday.  Past Medical History:  Diagnosis Date  . ADD 04/12/2006  . Anxiety   . ASTHMA 12/24/2009  . Depression   . IBS (irritable bowel syndrome)    NEGATIVE COLONSCOPY AND ENDOSCOPY  . INSOMNIA, TRANSIENT 12/24/2009  . PANIC ATTACK 12/24/2009  . Smoker    Past Surgical History:  Procedure Laterality Date  . COLONOSCOPY    . UPPER GASTROINTESTINAL ENDOSCOPY  2009    reports that she has been smoking cigarettes.  She has a 3.50 pack-year smoking history. She has never used smokeless tobacco. She reports that she does not drink alcohol or use drugs. family history includes Alcohol abuse in her paternal uncle; Diabetes in her paternal grandfather; Heart disease in her paternal grandfather; Hyperlipidemia in her maternal  grandfather; Hypertension in her paternal grandfather; Mental retardation in her maternal grandmother. Allergies  Allergen Reactions  . Prednisone Anaphylaxis  . Buspirone     "made her feel weird"  . Carafate [Sucralfate] Hives  . Meloxicam Other (See Comments)    GI upset     Review of Systems  Constitutional: Negative for chills and fever.  Respiratory: Negative for shortness of breath.   Cardiovascular: Negative for chest pain.  Gastrointestinal: Negative for abdominal pain, nausea and vomiting.  Genitourinary: Negative for dysuria.  Neurological: Positive for syncope and headaches. Negative for dizziness, speech difficulty and weakness.  Psychiatric/Behavioral: Negative for confusion.       Objective:   Physical Exam  Constitutional: She is oriented to person, place, and time. She appears well-developed and well-nourished.  HENT:  Head: Normocephalic and atraumatic.  Eyes: Pupils are equal, round, and reactive to light. EOM are normal.  Neck: Normal range of motion. Neck supple. No thyromegaly present.  Cardiovascular: Normal rate, regular rhythm and normal heart sounds. Exam reveals no gallop.  Pulmonary/Chest: Effort normal and breath sounds normal. She has no wheezes. She has no rales.  Musculoskeletal: She exhibits no edema.  Lymphadenopathy:    She has no cervical adenopathy.  Neurological: She is alert and oriented to person, place, and time. No cranial nerve deficit. Coordination normal.  No focal strength deficits  Skin: No rash noted.  Psychiatric: She has a normal mood and affect. Her behavior is normal. Thought content normal.  Assessment:     Patient presents with episode of altered consciousness last Tuesday. This did not sound typical of likely vasovagal syncope with reported 30 minutes of unconsciousness and also slow recovery. Question seizure.  Needs further evaluation    Plan:     -EKG today shows sinus rhythm with no significant conduction  abnormalities. -Check CBC and comprehensive metabolic panel -We've advised strictly no driving until further evaluated by neurology. Even though we do not know if this was a seizure, we reminded her that [place] laws prohibit driving for minimum of 6 months if this was a seizure. Also recommended avoiding ladders/climbing or working around any type of dangerous machinery until further evaluated. -Set up for further evaluation with neurology.  Kristian Covey MD [facility] at [facility]

## 2017-08-14 NOTE — Patient Instructions (Signed)
Syncope Syncope is when you temporarily lose consciousness. Syncope may also be called fainting or passing out. It is caused by a sudden decrease in blood flow to the brain. Even though most causes of syncope are not dangerous, syncope can be a sign of a serious medical problem. Signs that you may be about to faint include:  Feeling dizzy or light-headed.  Feeling nauseous.  Seeing all white or all black in your field of vision.  Having cold, clammy skin.  If you fainted, get medical help right away.Call your local emergency services (911 in the [place] Do not drive yourself to the hospital. Follow these instructions at home: Pay attention to any changes in your symptoms. Take these actions to help with your condition:  Have someone stay with you until you feel stable.  Do not drive, use machinery, or play sports until your health care provider says it is okay.  Keep all follow-up visits as told by your health care provider. This is important.  If you start to feel like you might faint, lie down right away and raise (elevate) your feet above the level of your heart. Breathe deeply and steadily. Wait until all of the symptoms have passed.  Drink enough fluid to keep your urine clear or pale yellow.  If you are taking blood pressure or heart medicine, get up slowly and take several minutes to sit and then stand. This can reduce dizziness.  Take over-the-counter and prescription medicines only as told by your health care provider.  Get help right away if:  You have a severe headache.  You have unusual pain in your chest, abdomen, or back.  You are bleeding from your mouth or rectum, or you have black or tarry stool.  You have a very fast or irregular heartbeat (palpitations).  You have pain with breathing.  You faint once or repeatedly.  You have a seizure.  You are confused.  You have trouble walking.  You have severe weakness.  You have vision problems. These  symptoms may represent a serious problem that is an emergency. Do not wait to see if your symptoms will go away. Get medical help right away. Call your local emergency services (911 in the [place] Do not drive yourself to the hospital. This information is not intended to replace advice given to you by your health care provider. Make sure you discuss any questions you have with your health care provider. Document Released: 03/28/2005 Document Revised: 09/03/2015 Document Reviewed: 12/10/2014 Elsevier Interactive Patient Education  [employer].  We will be setting up neurology referral  Recommend no further driving until evaluated by neurology.

## 2017-08-19 ENCOUNTER — Other Ambulatory Visit: Payer: Self-pay | Admitting: Internal Medicine

## 2017-08-21 ENCOUNTER — Other Ambulatory Visit: Payer: Self-pay | Admitting: Internal Medicine

## 2017-08-21 NOTE — Telephone Encounter (Signed)
Copied from CRM [id number]. Topic: Quick Communication - Rx Refill/Question >> Aug 21, 2017  6:41 PM Alexander Bergeron B wrote: Pt asked for an emergency refill  Medication: clonazePAM (KLONOPIN) 0.5 MG tablet [[id number]]  Has the patient contacted their pharmacy? Yes.   (Agent: If no, request that the patient contact the pharmacy for the refill.) [facility]erred Pharm[facility] (with phone number [facility] Agent: Please be advised that RX refills may take up to 3 business days. We ask that you follow-up with your pharmacy.

## 2017-08-22 MED ORDER — CLONAZEPAM 0.5 MG PO TABS: 0.5000 mg | ORAL_TABLET | Freq: Two times a day (BID) | ORAL | 5 refills | Status: DC | PRN

## 2017-08-22 NOTE — Telephone Encounter (Signed)
Refill with 5 additional refills. 

## 2017-08-23 ENCOUNTER — Ambulatory Visit: Payer: Self-pay | Admitting: Neurology

## 2017-08-24 ENCOUNTER — Other Ambulatory Visit: Payer: Self-pay | Admitting: Family Medicine

## 2017-08-24 NOTE — Telephone Encounter (Signed)
I left a message for the pt to return my call. 

## 2017-08-24 NOTE — Telephone Encounter (Signed)
Agree- would not refill at this time with ? Of undiagnosed syncope/?seizure.  Hopefully, she will get back in to see neurology soon to sort out.

## 2017-08-30 ENCOUNTER — Other Ambulatory Visit: Payer: Self-pay | Admitting: Family Medicine

## 2017-08-30 NOTE — Telephone Encounter (Signed)
This was already denied recently. The reason for this is recent altered sensorium and cannot rule out seizure. She has pending neurology evaluation

## 2017-09-03 ENCOUNTER — Other Ambulatory Visit: Payer: Self-pay | Admitting: Family Medicine

## 2017-09-15 ENCOUNTER — Other Ambulatory Visit: Payer: Self-pay | Admitting: Family Medicine

## 2017-09-18 DIAGNOSIS — R05 Cough: Secondary | ICD-10-CM | POA: Diagnosis not present

## 2017-09-18 DIAGNOSIS — J209 Acute bronchitis, unspecified: Secondary | ICD-10-CM | POA: Diagnosis not present

## 2017-09-18 DIAGNOSIS — J029 Acute pharyngitis, unspecified: Secondary | ICD-10-CM | POA: Diagnosis not present

## 2017-09-18 DIAGNOSIS — R509 Fever, unspecified: Secondary | ICD-10-CM | POA: Diagnosis not present

## 2017-09-18 DIAGNOSIS — R062 Wheezing: Secondary | ICD-10-CM | POA: Diagnosis not present

## 2017-09-27 ENCOUNTER — Other Ambulatory Visit: Payer: Self-pay | Admitting: Family Medicine

## 2017-09-27 DIAGNOSIS — R05 Cough: Secondary | ICD-10-CM | POA: Diagnosis not present

## 2017-09-27 DIAGNOSIS — J209 Acute bronchitis, unspecified: Secondary | ICD-10-CM | POA: Diagnosis not present

## 2017-09-27 DIAGNOSIS — J45901 Unspecified asthma with (acute) exacerbation: Secondary | ICD-10-CM | POA: Diagnosis not present

## 2017-09-27 DIAGNOSIS — R0602 Shortness of breath: Secondary | ICD-10-CM | POA: Diagnosis not present

## 2017-09-29 NOTE — Telephone Encounter (Signed)
With ? Of recent seizure, I do not feel that she should remain on Adderall for now until cleared by neurology (can lower seizure threshold).

## 2017-10-02 ENCOUNTER — Other Ambulatory Visit: Payer: Self-pay | Admitting: Family Medicine

## 2017-10-02 NOTE — Telephone Encounter (Signed)
Refill times 3 months only.  Will need follow up by Fall for repeat pap.

## 2017-10-02 NOTE — Telephone Encounter (Signed)
Last pap 02/25/15.  Okay to fill?

## 2017-10-04 ENCOUNTER — Other Ambulatory Visit: Payer: Self-pay | Admitting: Family Medicine

## 2017-10-04 ENCOUNTER — Encounter: Payer: Self-pay | Admitting: Family Medicine

## 2017-10-09 ENCOUNTER — Ambulatory Visit: Payer: BLUE CROSS/BLUE SHIELD | Admitting: Family Medicine

## 2017-10-13 ENCOUNTER — Ambulatory Visit (INDEPENDENT_AMBULATORY_CARE_PROVIDER_SITE_OTHER): Payer: BLUE CROSS/BLUE SHIELD | Admitting: Family Medicine

## 2017-10-13 ENCOUNTER — Encounter: Payer: Self-pay | Admitting: Family Medicine

## 2017-10-13 VITALS — BP 100/48 | HR 100 | Temp 98.6°F | Wt 103.2 lb

## 2017-10-13 DIAGNOSIS — F988 Other specified behavioral and emotional disorders with onset usually occurring in childhood and adolescence: Secondary | ICD-10-CM

## 2017-10-13 DIAGNOSIS — G43009 Migraine without aura, not intractable, without status migrainosus: Secondary | ICD-10-CM

## 2017-10-13 DIAGNOSIS — R404 Transient alteration of awareness: Secondary | ICD-10-CM | POA: Diagnosis not present

## 2017-10-13 MED ORDER — SUMATRIPTAN SUCCINATE 100 MG PO TABS: ORAL_TABLET | ORAL | 5 refills | Status: DC

## 2017-10-13 MED ORDER — AMPHETAMINE-DEXTROAMPHETAMINE 20 MG PO TABS: 20.0000 mg | ORAL_TABLET | Freq: Two times a day (BID) | ORAL | 0 refills | Status: DC

## 2017-10-13 NOTE — Progress Notes (Signed)
Subjective:     Patient ID: Leslie [name], female   DOB: May 08, 1990, 27 y.o.   MRN: [id number]  HPI Patient here to discuss medications. She is requesting refills of Imitrex and Adderall. These were both declined recently. She had syncopal episode at work back early May after starting a new job. We had concern for possible seizure. We referred her back to neurology but they had not gotten her in to be evaluated and apparently when they called patient declined follow-up. She's had no episodes whatsoever since then.   Patient seems fairly certain this was a stress reaction- in her judgement.. She states she had a catatonic type reaction once before with extreme stress. No history of known conversion disorder.  We were reluctant to prescribe Imitrex and Adderall until further cleared by neurology. She did have full workup back in 2017 for similar event which was unrevealing. EEG at that time revealed no seizure activity.  Patient states in retrospect she was very stressed by the job she started but now that she has been there and has gotten acquainted with some of the people things are going much better.  Denies illicit drug use or regular ETOH use.  Past Medical History:  Diagnosis Date  . ADD 04/12/2006  . Anxiety   . ASTHMA 12/24/2009  . Depression   . IBS (irritable bowel syndrome)    NEGATIVE COLONSCOPY AND ENDOSCOPY  . INSOMNIA, TRANSIENT 12/24/2009  . PANIC ATTACK 12/24/2009  . Smoker    Past Surgical History:  Procedure Laterality Date  . COLONOSCOPY    . UPPER GASTROINTESTINAL ENDOSCOPY  2009    reports that she has been smoking cigarettes.  She has a 3.50 pack-year smoking history. She has never used smokeless tobacco. She reports that she does not drink alcohol or use drugs. family history includes Alcohol abuse in her paternal uncle; Diabetes in her paternal grandfather; Heart disease in her paternal grandfather; Hyperlipidemia in her maternal grandfather; Hypertension in  her paternal grandfather; Mental retardation in her maternal grandmother. Allergies  Allergen Reactions  . Prednisone Anaphylaxis  . Buspirone     "made her feel weird"  . Carafate [Sucralfate] Hives  . Meloxicam Other (See Comments)    GI upset     Review of Systems  Constitutional: Negative for fatigue.  Eyes: Negative for visual disturbance.  Respiratory: Negative for cough, chest tightness, shortness of breath and wheezing.   Cardiovascular: Negative for chest pain, palpitations and leg swelling.  Neurological: Negative for dizziness, seizures, syncope, weakness, light-headedness and headaches.  Psychiatric/Behavioral: Negative for confusion.       Objective:   Physical Exam  Constitutional: She appears well-developed and well-nourished.  Eyes: Pupils are equal, round, and reactive to light.  Neck: Neck supple. No JVD present. No thyromegaly present.  Cardiovascular: Normal rate and regular rhythm. Exam reveals no gallop.  Pulmonary/Chest: Effort normal and breath sounds normal. No respiratory distress. She has no wheezes. She has no rales.  Musculoskeletal: She exhibits no edema.  Neurological: She is alert.       Assessment:     #1 history of migraine headaches  #2 attention deficit disorder  #3 past history of transient alteration in consciousness-?catotonia.    Plan:     -We agreed to refill her Imitrex and Adderall but if she has any further episodes of altered consciousness she must get neurology clearance before these are further prescribed -Patient had several questions regarding healthy weight gain. We gave her some suggestions. She inquired  about nutrition referral but we explained that insurance would not likely cover (wihtout other co-morbidities). She is encouraged to do calorie monitoring and make sure she is getting adequate protein and also focus on good resistance training to help build lean weight/ muscle  Kristian CoveyBruce W Renton Berkley MD [facility]  at [facility]

## 2017-11-21 ENCOUNTER — Other Ambulatory Visit: Payer: Self-pay | Admitting: Family Medicine

## 2017-12-21 ENCOUNTER — Ambulatory Visit (INDEPENDENT_AMBULATORY_CARE_PROVIDER_SITE_OTHER): Payer: BLUE CROSS/BLUE SHIELD | Admitting: Adult Health

## 2017-12-21 ENCOUNTER — Encounter: Payer: Self-pay | Admitting: Family Medicine

## 2017-12-21 ENCOUNTER — Encounter: Payer: Self-pay | Admitting: Adult Health

## 2017-12-21 VITALS — BP 106/60 | Temp 98.7°F | Wt 101.0 lb

## 2017-12-21 DIAGNOSIS — J0140 Acute pansinusitis, unspecified: Secondary | ICD-10-CM

## 2017-12-21 MED ORDER — AZITHROMYCIN 250 MG PO TABS: ORAL_TABLET | ORAL | 0 refills | Status: DC

## 2017-12-21 NOTE — Progress Notes (Signed)
Subjective:    Patient ID: Leslie [name], female    DOB: Aug 25, 1990, 27 y.o.   MRN: [id number]  Sinusitis  This is a new problem. The current episode started in the past 7 days. The problem has been gradually worsening since onset. The fever has been present for 3 to 4 days. Associated symptoms include chills, congestion, coughing (dry ), ear pain, headaches, sinus pressure and a sore throat. Pertinent negatives include no diaphoresis or shortness of breath. Past treatments include acetaminophen and spray decongestants (Tylenol, Claritin-D, and Mucinex ). The treatment provided mild relief.    Review of Systems  Constitutional: Positive for activity change, chills, fatigue and fever. Negative for diaphoresis.  HENT: Positive for congestion, ear pain, sinus pressure, sinus pain and sore throat. Negative for ear discharge.   Respiratory: Positive for cough (dry ). Negative for chest tightness, shortness of breath and wheezing.   Cardiovascular: Negative.   Musculoskeletal: Negative.   Neurological: Positive for headaches.  All other systems reviewed and are negative.  Past Medical History:  Diagnosis Date  . ADD 04/12/2006  . Anxiety   . ASTHMA 12/24/2009  . Depression   . IBS (irritable bowel syndrome)    NEGATIVE COLONSCOPY AND ENDOSCOPY  . INSOMNIA, TRANSIENT 12/24/2009  . PANIC ATTACK 12/24/2009  . Smoker     Social History   Socioeconomic History  . Marital status: Single    Spouse name: Not on file  . Number of children: 0  . Years of education: Not on file  . Highest education level: Not on file  Occupational History  . Occupation: Research scientist (medical)    Comment: Health visitor  Social Needs  . Financial resource strain: Not on file  . Food insecurity:    Worry: Not on file    Inability: Not on file  . Transportation needs:    Medical: Not on file    Non-medical: Not on file  Tobacco Use  . Smoking status: Current Every Day Smoker    Packs/day: 0.50    Years: 7.00   Pack years: 3.50    Types: Cigarettes  . Smokeless tobacco: Never Used  Substance and Sexual Activity  . Alcohol use: No    Alcohol/week: 0.0 standard drinks  . Drug use: No  . Sexual activity: Yes    Birth control/protection: Pill  Lifestyle  . Physical activity:    Days per week: Not on file    Minutes per session: Not on file  . Stress: Not on file  Relationships  . Social connections:    Talks on phone: Not on file    Gets together: Not on file    Attends religious service: Not on file    Active member of club or organization: Not on file    Attends meetings of clubs or organizations: Not on file    Relationship status: Not on file  . Intimate partner violence:    Fear of current or ex partner: Not on file    Emotionally abused: Not on file    Physically abused: Not on file    Forced sexual activity: Not on file  Other Topics Concern  . Not on file  Social History Narrative  . Not on file    Past Surgical History:  Procedure Laterality Date  . COLONOSCOPY    . UPPER GASTROINTESTINAL ENDOSCOPY  2009    Family History  Problem Relation Age of Onset  . Alcohol abuse Paternal Uncle   . Mental retardation Maternal  Grandmother   . Hyperlipidemia Maternal Grandfather   . Diabetes Paternal Grandfather   . Hypertension Paternal Grandfather   . Heart disease Paternal Grandfather     Allergies  Allergen Reactions  . Prednisone Anaphylaxis  . Buspirone     "made her feel weird"  . Carafate [Sucralfate] Hives  . Meloxicam Other (See Comments)    GI upset    Current Outpatient Medications on File Prior to Visit  Medication Sig Dispense Refill  . acetaminophen (TYLENOL) 500 MG tablet Take 1,000 mg by mouth every 6 (six) hours as needed for moderate pain.    Marland Kitchen. amphetamine-dextroamphetamine (ADDERALL) 20 MG tablet Take 1 tablet (20 mg total) by mouth 2 (two) times daily. May fill in two months 60 tablet 0  . amphetamine-dextroamphetamine (ADDERALL) 20 MG tablet Take  1 tablet (20 mg total) by mouth 2 (two) times daily. May fill in one month 60 tablet 0  . amphetamine-dextroamphetamine (ADDERALL) 20 MG tablet Take 1 tablet (20 mg total) by mouth 2 (two) times daily. 60 tablet 0  . clonazePAM (KLONOPIN) 0.5 MG tablet Take 1 tablet (0.5 mg total) by mouth 2 (two) times daily as needed for anxiety. 60 tablet 5  . FLUoxetine (PROZAC) 40 MG capsule Take 1 capsule (40 mg total) daily by mouth. 30 capsule 11  . JUNEL 1.5/30 1.5-30 MG-MCG tablet TAKE 1 TABLET BY MOUTH EVERY DAY 21 tablet 2  . Lysine 1000 MG TABS Take by mouth.    Marland Kitchen. OVER THE COUNTER MEDICATION iron    . OVER THE COUNTER MEDICATION Multi vitamin    . SUMAtriptan (IMITREX) 100 MG tablet TAKE 1 TABLET EVERY 2 HOURS AS NEEDED FOR MIGRAINE MAY REPEAT IN 2 HOURS IF HEADACHE PERSISTS 10 tablet 5  . valACYclovir (VALTREX) 1000 MG tablet take two at onset of cold sore and repeat two in 12 hours 1 (Patient not taking: Reported on 12/21/2017) 30 tablet 1   No current facility-administered medications on file prior to visit.     BP 106/60   Temp 98.7 F (37.1 C)   Wt 101 lb (45.8 kg)   BMI 17.34 kg/m       Objective:   Physical Exam  Constitutional: She appears well-developed and well-nourished. She appears ill. No distress.  HENT:  Right Ear: Hearing, tympanic membrane, external ear and ear canal normal.  Left Ear: Hearing, tympanic membrane, external ear and ear canal normal.  Nose: Mucosal edema present. Right sinus exhibits maxillary sinus tenderness and frontal sinus tenderness. Left sinus exhibits maxillary sinus tenderness and frontal sinus tenderness.  Mouth/Throat: Oropharynx is clear and moist. No posterior oropharyngeal erythema.  Eyes: Pupils are equal, round, and reactive to light. Conjunctivae and EOM are normal.  Cardiovascular: Normal rate, regular rhythm, normal heart sounds and intact distal pulses. Exam reveals no gallop and no friction rub.  No murmur heard. Pulmonary/Chest: Effort  normal and breath sounds normal.  Skin: Skin is dry. She is not diaphoretic.  Psychiatric: She has a normal mood and affect. Her behavior is normal. Judgment and thought content normal.  Nursing note and vitals reviewed.     Assessment & Plan:  1. Acute non-recurrent pansinusitis - Due to worsening symptoms and 4 days of fever, will treat.  - Advised Delsym for cough PRN  - azithromycin (ZITHROMAX Z-PAK) 250 MG tablet; Take 2 tablets on Day 1.  Then take 1 tablet daily.  Dispense: 6 tablet; Refill: 0 - Stay hydrated and rest  - Follow up in  2-3 days if no improvement  - Tylenol for symptom relief   Shirline Frees, NP

## 2017-12-27 ENCOUNTER — Other Ambulatory Visit: Payer: Self-pay

## 2017-12-27 ENCOUNTER — Encounter: Payer: Self-pay | Admitting: Family Medicine

## 2017-12-27 ENCOUNTER — Ambulatory Visit (INDEPENDENT_AMBULATORY_CARE_PROVIDER_SITE_OTHER): Payer: BLUE CROSS/BLUE SHIELD | Admitting: Family Medicine

## 2017-12-27 VITALS — BP 126/78 | HR 86 | Temp 98.5°F | Resp 17 | Ht 63.5 in | Wt 103.4 lb

## 2017-12-27 DIAGNOSIS — F411 Generalized anxiety disorder: Secondary | ICD-10-CM | POA: Diagnosis not present

## 2017-12-27 DIAGNOSIS — F329 Major depressive disorder, single episode, unspecified: Secondary | ICD-10-CM

## 2017-12-27 DIAGNOSIS — R4589 Other symptoms and signs involving emotional state: Secondary | ICD-10-CM

## 2017-12-27 MED ORDER — ALPRAZOLAM 1 MG PO TABS: 1.0000 mg | ORAL_TABLET | Freq: Three times a day (TID) | ORAL | 0 refills | Status: DC | PRN

## 2017-12-27 MED ORDER — FLUOXETINE HCL 20 MG PO TABS: 20.0000 mg | ORAL_TABLET | Freq: Every day | ORAL | 0 refills | Status: DC

## 2017-12-27 NOTE — Progress Notes (Signed)
  Subjective:     Patient ID: Leslie [name], female   DOB: 07/27/1990, 27 y.o.   MRN: [id number]  HPI Patient here to discuss medications. She has long history of anxiety and depression. Currently on Klonopin 0.5 mg currently taking only at night. She is on Prozac 40 mrem daily. She feels that she still has some depression symptoms intermittently. No suicidal ideation. She describes anxiety attacks which she thinks are panic attacks. These come on fairly suddenly and invoke multiple somatic symptoms including dyspnea, tremors, chest tightness. She states that years ago she took Xanax and that seemed to work more effectively for these breakthrough symptoms. She denies any specific stressors at this time.  No ETOH or illicit drug use.  Past Medical History:  Diagnosis Date  . ADD 04/12/2006  . Anxiety   . ASTHMA 12/24/2009  . Depression   . IBS (irritable bowel syndrome)    NEGATIVE COLONSCOPY AND ENDOSCOPY  . INSOMNIA, TRANSIENT 12/24/2009  . PANIC ATTACK 12/24/2009  . Smoker    Past Surgical History:  Procedure Laterality Date  . COLONOSCOPY    . UPPER GASTROINTESTINAL ENDOSCOPY  2009    reports that she has been smoking cigarettes. She has a 3.50 pack-year smoking history. She has never used smokeless tobacco. She reports that she does not drink alcohol or use drugs. family history includes Alcohol abuse in her paternal uncle; Diabetes in her paternal grandfather; Heart disease in her paternal grandfather; Hyperlipidemia in her maternal grandfather; Hypertension in her paternal grandfather; Mental retardation in her maternal grandmother. Allergies  Allergen Reactions  . Prednisone Anaphylaxis  . Buspirone     "made her feel weird"  . Carafate [Sucralfate] Hives  . Meloxicam Other (See Comments)    GI upset     Review of Systems  Constitutional: Negative for appetite change and unexpected weight change.  Respiratory: Negative for shortness of breath.   Cardiovascular: Negative  for chest pain.  Neurological: Negative for dizziness and headaches.  Psychiatric/Behavioral: Positive for dysphoric mood. Negative for agitation, confusion, sleep disturbance and suicidal ideas. The patient is nervous/anxious.        Objective:   Physical Exam  Constitutional: She is oriented to person, place, and time. She appears well-developed and well-nourished.  Cardiovascular: Normal rate and regular rhythm.  Pulmonary/Chest: Effort normal and breath sounds normal.  Neurological: She is alert and oriented to person, place, and time.  Psychiatric: She has a normal mood and affect. Her behavior is normal.       Assessment:     #1 anxiety disorder. She describes discrete episodes of anxiety which do sound suspicious for panic disorder. Already on Prozac 40 mg daily which has curtailed frequency and severity somewhat  #2 long-standing history of recurrent depression    Plan:     -titrate Prozac up to 60 mg daily -decrease Klonopin 0.5 mg to one half tablet daily at bedtime for 2 weeks and then discontinue -wrote for limited alprazolam 1 mg 1 every 8 hours as needed only for severe breakthrough anxiety (panic) symptoms -Follow-up in one month to reassess.  Kristian CoveyBruce W Burchette MD [facility]

## 2017-12-27 NOTE — Patient Instructions (Addendum)
Reduce the Klonopin to one half tablet at night for 2 weeks and then discontinue  Increase the Prozac to 60 mg daily.  Give me some feedback in a couple of weeks.

## 2017-12-28 ENCOUNTER — Telehealth: Payer: Self-pay | Admitting: Family Medicine

## 2017-12-28 ENCOUNTER — Other Ambulatory Visit: Payer: Self-pay | Admitting: Family Medicine

## 2017-12-28 NOTE — Telephone Encounter (Signed)
Copied from CRM [id number]. Topic: Quick Communication - See Telephone Encounter >> Dec 28, 2017  2:18 PM Arlyss Gandyichardson, Brendia Dampier N, NT wrote: CRM for notification. See Telephone encounter for: 12/28/17. Pt states she was seen yesterday and her FLUoxetine (PROZAC) 20 MG was sent in for tablets instead of capsules and she wants to see if [id number]- [place], [place] - [phone] (Phone) [phone] (Fax)

## 2017-12-29 ENCOUNTER — Encounter: Payer: Self-pay | Admitting: Family Medicine

## 2017-12-29 MED ORDER — FLUOXETINE HCL 40 MG PO CAPS: 40.0000 mg | ORAL_CAPSULE | Freq: Every day | ORAL | 1 refills | Status: DC

## 2017-12-29 MED ORDER — FLUOXETINE HCL 20 MG PO CAPS: 20.0000 mg | ORAL_CAPSULE | Freq: Every day | ORAL | 3 refills | Status: DC

## 2017-12-29 NOTE — Telephone Encounter (Signed)
Rx sent 

## 2018-01-09 ENCOUNTER — Telehealth: Payer: Self-pay | Admitting: *Deleted

## 2018-01-09 NOTE — Telephone Encounter (Signed)
FYI  Patient has an appointment 01/10/18 at 2:45 pm for "depression and self harm".  I called the patient and offered an earlier appointment 01/09/18 at 11:30. Patient declined stating she's "okay to wait until tomorrow".

## 2018-01-10 ENCOUNTER — Other Ambulatory Visit: Payer: Self-pay

## 2018-01-10 ENCOUNTER — Encounter (HOSPITAL_COMMUNITY): Payer: Self-pay | Admitting: Behavioral Health

## 2018-01-10 ENCOUNTER — Encounter: Payer: Self-pay | Admitting: Family Medicine

## 2018-01-10 ENCOUNTER — Ambulatory Visit (HOSPITAL_COMMUNITY)
Admission: AD | Admit: 2018-01-10 | Discharge: 2018-01-10 | Disposition: A | Discharge status: Home / Self Care | Payer: BLUE CROSS/BLUE SHIELD | Attending: Psychiatry | Admitting: Psychiatry

## 2018-01-10 ENCOUNTER — Ambulatory Visit (INDEPENDENT_AMBULATORY_CARE_PROVIDER_SITE_OTHER): Payer: BLUE CROSS/BLUE SHIELD | Admitting: Family Medicine

## 2018-01-10 VITALS — BP 102/70 | HR 106 | Temp 98.3°F | Ht 63.5 in | Wt 103.0 lb

## 2018-01-10 DIAGNOSIS — R45851 Suicidal ideations: Secondary | ICD-10-CM

## 2018-01-10 DIAGNOSIS — F322 Major depressive disorder, single episode, severe without psychotic features: Secondary | ICD-10-CM

## 2018-01-10 NOTE — Progress Notes (Signed)
  Subjective:     Patient ID: Leslie [name], female   DOB: [birth year], 27 y.o.   MRN: [id number]  HPI Patient has chronic problems including history of migraine headaches, IBS, chronic anxiety, attention deficit disorder, depression.  She had called in earlier stating that starting around the end of last week she had some increased suicidal thoughts.  She did have some self harm gestures with superficial cuts on her left forearm.  She denies any active suicidal ideation at this moment.  History is that she had suicidal ideation at age 22 and had inpatient psychiatric care then.  She is currently on Prozac.  She takes 40 mg daily.  We recently increased to 60 but she has not been taking that real consistently.  She states she feels overwhelmed with combination of work and school.  She is currently living with her mother.  Denies any illicit drug use or regular alcohol use.  Denies any other new stressors.  Past Medical History:  Diagnosis Date  . ADD 04/12/2006  . Anxiety   . ASTHMA 12/24/2009  . Depression   . IBS (irritable bowel syndrome)    NEGATIVE COLONSCOPY AND ENDOSCOPY  . INSOMNIA, TRANSIENT 12/24/2009  . PANIC ATTACK 12/24/2009  . Smoker    Past Surgical History:  Procedure Laterality Date  . COLONOSCOPY    . UPPER GASTROINTESTINAL ENDOSCOPY  2009    reports that she has been smoking cigarettes. She has a 3.50 pack-year smoking history. She has never used smokeless tobacco. She reports that she does not drink alcohol or use drugs. family history includes Alcohol abuse in her paternal uncle; Diabetes in her paternal grandfather; Heart disease in her paternal grandfather; Hyperlipidemia in her maternal grandfather; Hypertension in her paternal grandfather; Mental retardation in her maternal grandmother. Allergies  Allergen Reactions  . Prednisone Anaphylaxis  . Buspirone     "made her feel weird"  . Carafate [Sucralfate] Hives  . Meloxicam Other (See Comments)    GI upset      Review of Systems  Constitutional: Negative for chills and fever.  Respiratory: Negative for shortness of breath.   Psychiatric/Behavioral: Positive for dysphoric mood, sleep disturbance and suicidal ideas. Negative for confusion. The patient is nervous/anxious.        Objective:   Physical Exam  Constitutional: She is oriented to person, place, and time. She appears well-developed and well-nourished.  Cardiovascular: Normal rate.  Pulmonary/Chest: Effort normal and breath sounds normal.  Neurological: She is alert and oriented to person, place, and time.  Skin:  On her left forearm volar surface she has several superficial horizontal cuts.  No signs of secondary infection  Psychiatric:  PHQ-9 score of 21       Assessment:     Patient presents with past history of anxiety/ severe depression with some recent suicidal thoughts and self-harm behavior with superficial cutting of her left forearm    Plan:     -We have strongly advocated that she present to behavioral health for possible intake evaluation this afternoon.  She agrees to this plan.  She will go directly to behavioral health and she was reminded of instructions how to get there.  Kristian Covey MD [facility] at [facility]
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
## 2018-01-11 ENCOUNTER — Encounter: Payer: Self-pay | Admitting: Family Medicine

## 2018-01-11 NOTE — H&P (Signed)
Behavioral Health Medical Screening Exam  Leslie [name] is a 27 y.o. Caucasian female with hx of Major depressive & anxiety disorders. She came to the [facility] as a walk-in seeking a referral to an outpatient psychiatric provider & a therapist to help manage her worsening depression, anxiety & thoughts of self-mutilation. She reports that her outpatient primary care provider, Dr. Caryl Never who has been prescribing her antidepressant referred her to [facility] for more evaluation. She reports that her Prozac was increased from 40 mg to 60 mg about 2 weeks ago & that has not helped her depression or anxiety symptoms. She says she is not interested in being inpatient as she is in school, has full time job & a dog to care for at home. She also says she does not like inpatient psychiatric stay as it has messed her up in the past listening to other peoples' problems while hospitalized. She currently denies any SIHI, AVH, delusional thoughts or paranoia. She does not appear to be responding to any internal stimuli. She denies any drug or alcohol use. She has elected to go home with referrals for IOP, outpatient psychiatric care & Counseling services. She does not appear to be in any apparent distress.  Total Time spent with patient: 30 minutes  Psychiatric Specialty Exam: Physical Exam  Constitutional: She is oriented to person, place, and time. She appears well-developed.  Thin framed.  HENT:  Head: Normocephalic.  Eyes: Pupils are equal, round, and reactive to light.  Neck: Normal range of motion.  Cardiovascular: Normal rate.  Respiratory: Effort normal.  GI: Soft.  Genitourinary:  Genitourinary Comments: Deferred  Musculoskeletal: Normal range of motion.  Neurological: She is alert and oriented to person, place, and time.  Skin: Skin is warm.    Review of Systems  Constitutional: Negative.   HENT: Negative.   Eyes: Negative.   Respiratory: Negative.  Negative for cough and shortness of breath.    Cardiovascular: Negative.  Negative for chest pain and palpitations.  Gastrointestinal: Negative.  Negative for abdominal pain, nausea and vomiting.  Genitourinary: Negative.   Musculoskeletal: Negative.   Skin: Negative.   Neurological: Negative.   Endo/Heme/Allergies: Negative.   Psychiatric/Behavioral: Positive for depression. Negative for hallucinations, substance abuse ("I'm on Xanax 1 mg.) and suicidal ideas. The patient is nervous/anxious. The patient does not have insomnia.     Blood pressure 122/81, pulse 92, temperature 98.4 F (36.9 C), temperature source Oral, resp. rate 18, SpO2 100 %.There is no height or weight on file to calculate BMI.  General Appearance: Casual, thin-framed, multiple bodily & facial piecings.  Eye Contact:  Good  Speech:  Clear and Coherent and Normal Rate  Volume:  Normal  Mood:  Anxious and Depressed  Affect:  Flat  Thought Process:  Coherent, Linear and Descriptions of Associations: Intact  Orientation:  Full (Time, Place, and Person)  Thought Content:  Logical  Suicidal Thoughts:  Currently denies any thoughts, plans or intent. Has hx of cutting (self-mutilation)  Homicidal Thoughts:  Denies  Memory:  Immediate;   Good Recent;   Good Remote;   Good  Judgement:  Intact  Insight:  Present  Psychomotor Activity:  Anxious  Concentration: Concentration: Good and Attention Span: Good  Recall:  Good  Fund of Knowledge:Good  Language: Good  Akathisia:  Negative  Handed:  Right  AIMS (if indicated):     Assets:  Communication Skills Desire for Improvement Physical Health Social Support  Sleep: NA   Musculoskeletal: Strength & Muscle Tone:  within normal limits Gait & Station: normal Patient leans: N/A  Blood pressure 122/81, pulse 92, temperature 98.4 F (36.9 C), temperature source Oral, resp. rate 18, SpO2 100 %.  Recommendations: Based on my evaluation the patient does not appear to have an emergency medical condition. She says she  just came to the [facility] per referral from her outpatient primary care provider due to worsening symptoms of her depression. Shawnita says she does not have an outpatient psychiatric provider or a therapist at this time. She says she is in need of a referral to an outpatient provider & a therapist and not an inpatient psychiatric hospitalization. She says the few times she was hospitalized in a psychiatric hospital did her more harm than good. She says will rather go home to her dog & pursue outpatient psychiatric services instead. valta currently denies any SIHI, AVH, delusional thoughts or paranoia. She dose not appear to be responding to any internal stimuli. She is instructed & encouraged in the event of worsening symptoms to call the crisis hotline, 911 and or go to the nearest ED for appropriate evaluation and treatment of symptoms.  Armandina Stammer, NP, PMHNP, FNP-BC. 01/11/2018, 9:50 AM

## 2018-01-24 ENCOUNTER — Telehealth: Payer: Self-pay | Admitting: *Deleted

## 2018-01-24 NOTE — Telephone Encounter (Signed)
Copied from CRM [id number]. Topic: Referral - Request for Referral >> Jan 24, 2018  3:59 PM Marylen Ponto wrote:  Has patient seen PCP for this complaint? yes  Depression  *If NO, is insurance requiring patient see PCP for this issue before PCP can refer them? Referral for which specialty: Pt request referral to counselor/ therapist for depression  Preferred provider/office: Provider unknown / Per pt Dr Caryl Never advised her that there is a provider in the practice Reason for referral: pt states depression is taking a hard turn and she really needs to find a therapist  Pt states she was evaluated at [facility] walk in but she has not heard from anyone since the last time she went in to see someone.

## 2018-01-24 NOTE — Telephone Encounter (Signed)
Left message on machine for patient to return our call 

## 2018-01-30 ENCOUNTER — Other Ambulatory Visit: Payer: Self-pay

## 2018-01-30 ENCOUNTER — Encounter: Payer: Self-pay | Admitting: Family Medicine

## 2018-01-30 ENCOUNTER — Ambulatory Visit (INDEPENDENT_AMBULATORY_CARE_PROVIDER_SITE_OTHER): Payer: BLUE CROSS/BLUE SHIELD | Admitting: Family Medicine

## 2018-01-30 VITALS — BP 102/74 | HR 113 | Temp 98.2°F | Ht 63.5 in | Wt 108.3 lb

## 2018-01-30 DIAGNOSIS — F988 Other specified behavioral and emotional disorders with onset usually occurring in childhood and adolescence: Secondary | ICD-10-CM

## 2018-01-30 DIAGNOSIS — F339 Major depressive disorder, recurrent, unspecified: Secondary | ICD-10-CM

## 2018-01-30 NOTE — Progress Notes (Signed)
Subjective:     Patient ID: Leslie [name], female   DOB: 1990-07-25, 27 y.o.   MRN: [id number]  HPI Patient is seen for follow-up from visit on 01/10/2018.  She came in at that time with worsening depression symptoms.  She was on Prozac -at that point 60 mg daily.  She had some self hurt type gestures with cutting of her left forearm.  She did express some suicidal concern and we sent her that day to behavioral health for further evaluation.  She was not deemed necessary for admission at that point.  There was recommendation for outpatient follow-up but apparently they were very slow getting back with her.  She does feel that she is some improved at this time.  She has remained out of work since 01/07/2018.  She denies any active suicidal thoughts at this time.  She is on Prozac 40 mg daily.  Patient had admission for depression back in her teens.  She thinks she was placed on some type of mood stabilizer then but she is not sure which.  She is basically requesting a "mood stabilizer" at this time.  She thinks she may have been diagnosed with bipolar years ago but is not sure how definitive that diagnosis was.  She has some periods where she seems to have high energy but has never had prolonged periods of insomnia or any agitation or impulsive type behaviors.  She has ADD stable on Adderall .  Her weight is up slightly over recent months.  Past Medical History:  Diagnosis Date  . ADD 04/12/2006  . Anxiety   . ASTHMA 12/24/2009  . Depression   . IBS (irritable bowel syndrome)    NEGATIVE COLONSCOPY AND ENDOSCOPY  . INSOMNIA, TRANSIENT 12/24/2009  . PANIC ATTACK 12/24/2009  . Smoker    Past Surgical History:  Procedure Laterality Date  . COLONOSCOPY    . UPPER GASTROINTESTINAL ENDOSCOPY  2009    reports that she has been smoking cigarettes. She has a 3.50 pack-year smoking history. She has never used smokeless tobacco. She reports that she drinks alcohol. She reports that she has  current or past drug history. family history includes Alcohol abuse in her paternal uncle; Diabetes in her paternal grandfather; Heart disease in her paternal grandfather; Hyperlipidemia in her maternal grandfather; Hypertension in her paternal grandfather; Mental retardation in her maternal grandmother. Allergies  Allergen Reactions  . Prednisone Anaphylaxis  . Buspirone     "made her feel weird"  . Carafate [Sucralfate] Hives  . Meloxicam Other (See Comments)    GI upset     Review of Systems  Constitutional: Negative for appetite change and unexpected weight change.  Psychiatric/Behavioral: Positive for dysphoric mood. Negative for agitation, confusion, self-injury and suicidal ideas.       Objective:   Physical Exam  Constitutional: She is oriented to person, place, and time. She appears well-developed and well-nourished.  Cardiovascular: Normal rate and regular rhythm.  Pulmonary/Chest: Effort normal. She has no wheezes. She has no rales.  Neurological: She is alert and oriented to person, place, and time. No cranial nerve deficit.  Psychiatric:  PHQ-9 of 16       Assessment:     #1 History of recurrent depression.  Recent self hurt gestures but no active current suicidal intent.  #2 ADD stable on Adderall.     Plan:     -Continue Prozac 40 mg daily -We will look at referral to psychiatrist locally for second opinion regarding medications -Consider  setting up counseling  Kristian Covey MD [facility] at [facility]

## 2018-01-31 NOTE — Telephone Encounter (Signed)
Patient came in for office visit 01/30/18

## 2018-02-01 ENCOUNTER — Other Ambulatory Visit: Payer: Self-pay | Admitting: Family Medicine

## 2018-02-02 ENCOUNTER — Other Ambulatory Visit: Payer: Self-pay | Admitting: Family Medicine

## 2018-02-02 NOTE — Telephone Encounter (Signed)
Declined.  We explained that this would only be used for severe anxiety symptoms and not for regular use.

## 2018-02-02 NOTE — Telephone Encounter (Signed)
Last OV 01/30/18, No future OV  Last filled 12/27/17, # 30 with 0 refills 

## 2018-02-05 NOTE — Telephone Encounter (Signed)
Last OV 01/30/18, No future OV  Last filled 12/27/17, # 30 with 0 refills

## 2018-02-09 ENCOUNTER — Encounter: Payer: Self-pay | Admitting: Family Medicine

## 2018-02-13 ENCOUNTER — Ambulatory Visit (HOSPITAL_COMMUNITY)
Admission: RE | Admit: 2018-02-13 | Discharge: 2018-02-13 | Disposition: A | Discharge status: Home / Self Care | Payer: BLUE CROSS/BLUE SHIELD | Source: Home / Self Care | Attending: Psychiatry | Admitting: Psychiatry

## 2018-02-13 DIAGNOSIS — F1020 Alcohol dependence, uncomplicated: Secondary | ICD-10-CM | POA: Insufficient documentation

## 2018-02-13 DIAGNOSIS — G4700 Insomnia, unspecified: Secondary | ICD-10-CM

## 2018-02-13 DIAGNOSIS — F329 Major depressive disorder, single episode, unspecified: Secondary | ICD-10-CM | POA: Insufficient documentation

## 2018-02-13 DIAGNOSIS — Z79899 Other long term (current) drug therapy: Secondary | ICD-10-CM | POA: Insufficient documentation

## 2018-02-13 DIAGNOSIS — F10929 Alcohol use, unspecified with intoxication, unspecified: Secondary | ICD-10-CM | POA: Diagnosis not present

## 2018-02-13 DIAGNOSIS — J45909 Unspecified asthma, uncomplicated: Secondary | ICD-10-CM | POA: Insufficient documentation

## 2018-02-13 DIAGNOSIS — F17210 Nicotine dependence, cigarettes, uncomplicated: Secondary | ICD-10-CM | POA: Insufficient documentation

## 2018-02-13 DIAGNOSIS — F1994 Other psychoactive substance use, unspecified with psychoactive substance-induced mood disorder: Secondary | ICD-10-CM | POA: Diagnosis not present

## 2018-02-13 DIAGNOSIS — K589 Irritable bowel syndrome without diarrhea: Secondary | ICD-10-CM | POA: Insufficient documentation

## 2018-02-13 DIAGNOSIS — Z008 Encounter for other general examination: Secondary | ICD-10-CM | POA: Insufficient documentation

## 2018-02-13 DIAGNOSIS — R45851 Suicidal ideations: Secondary | ICD-10-CM | POA: Insufficient documentation

## 2018-02-13 DIAGNOSIS — F419 Anxiety disorder, unspecified: Secondary | ICD-10-CM | POA: Insufficient documentation

## 2018-02-13 DIAGNOSIS — F332 Major depressive disorder, recurrent severe without psychotic features: Secondary | ICD-10-CM | POA: Insufficient documentation

## 2018-02-13 DIAGNOSIS — Y905 Blood alcohol level of 100-119 mg/100 ml: Secondary | ICD-10-CM | POA: Insufficient documentation
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
## 2018-02-13 NOTE — H&P (Signed)
Behavioral Health Medical Screening Exam  Leslie [name] is an 27 y.o. female.  Total Time spent with patient: 20 minutes  Psychiatric Specialty Exam: Physical Exam  Constitutional: She is oriented to person, place, and time. She appears well-developed and well-nourished. No distress.  HENT:  Head: Normocephalic and atraumatic.  Right Ear: External ear normal.  Left Ear: External ear normal.  Eyes: Pupils are equal, round, and reactive to light. Right eye exhibits no discharge. Left eye exhibits no discharge.  Respiratory: Effort normal. No respiratory distress.  Musculoskeletal: Normal range of motion.  Neurological: She is alert and oriented to person, place, and time.  Skin: Skin is warm and dry. She is not diaphoretic.  Psychiatric: Her mood appears anxious. Her speech is slurred. She is not withdrawn and not actively hallucinating. Thought content is not paranoid and not delusional. Cognition and memory are normal. She expresses impulsivity and inappropriate judgment. She exhibits a depressed mood. She expresses suicidal ideation. She expresses no homicidal ideation. She expresses suicidal plans.    Review of Systems  Constitutional: Negative for chills, fever and weight loss.  Psychiatric/Behavioral: Positive for depression, substance abuse and suicidal ideas. Negative for hallucinations and memory loss. The patient is nervous/anxious and has insomnia.   All other systems reviewed and are negative.   There were no vitals taken for this visit.There is no height or weight on file to calculate BMI.  General Appearance: Casual and Fairly Groomed  Eye Contact:  Fair  Speech:  Slurred  Volume:  Normal  Mood:  Anxious, Depressed, Hopeless, Irritable and Worthless  Affect:  Congruent and Depressed  Thought Process:  Coherent, Goal Directed and Descriptions of Associations: Intact  Orientation:  Full (Time, Place, and Person)  Thought Content:  Logical and Hallucinations: None   Suicidal Thoughts:  Yes.  with intent/plan  Homicidal Thoughts:  No  Memory:  Immediate;   Fair Recent;   Fair  Judgement:  Impaired  Insight:  Lacking  Psychomotor Activity:  Decreased  Concentration: Concentration: Fair and Attention Span: Fair  Recall:  [employer] of Knowledge:Fair  Language: Fair  Akathisia:  NA  Handed:  Right  AIMS (if indicated):     Assets:  Communication Skills Desire for Improvement Financial Resources/Insurance Housing Intimacy Leisure Time Physical Health  Sleep:       Musculoskeletal: Strength & Muscle Tone: within normal limits Gait & Station: unsteady   Recommendations:  Based on my evaluation the patient does not appear to have an emergency medical condition.  Jackelyn Poling, NP 02/13/2018, 11:42 PM

## 2018-02-14 ENCOUNTER — Emergency Department (HOSPITAL_COMMUNITY)
Admission: EM | Admit: 2018-02-14 | Discharge: 2018-02-14 | Disposition: A | Discharge status: Home / Self Care | Payer: BLUE CROSS/BLUE SHIELD | Attending: Emergency Medicine | Admitting: Emergency Medicine

## 2018-02-14 ENCOUNTER — Encounter (HOSPITAL_COMMUNITY): Payer: Self-pay | Admitting: Emergency Medicine

## 2018-02-14 ENCOUNTER — Inpatient Hospital Stay (HOSPITAL_COMMUNITY): Admission: AD | Admit: 2018-02-14 | Payer: BLUE CROSS/BLUE SHIELD | Source: Intra-hospital | Admitting: Psychiatry

## 2018-02-14 ENCOUNTER — Other Ambulatory Visit: Payer: Self-pay

## 2018-02-14 ENCOUNTER — Telehealth: Payer: Self-pay | Admitting: Family Medicine

## 2018-02-14 DIAGNOSIS — F32A Depression, unspecified: Secondary | ICD-10-CM

## 2018-02-14 DIAGNOSIS — F329 Major depressive disorder, single episode, unspecified: Secondary | ICD-10-CM

## 2018-02-14 DIAGNOSIS — F39 Unspecified mood [affective] disorder: Secondary | ICD-10-CM | POA: Diagnosis present

## 2018-02-14 DIAGNOSIS — F1994 Other psychoactive substance use, unspecified with psychoactive substance-induced mood disorder: Secondary | ICD-10-CM

## 2018-02-14 DIAGNOSIS — R45851 Suicidal ideations: Secondary | ICD-10-CM

## 2018-02-14 LAB — COMPREHENSIVE METABOLIC PANEL
ALT: 24 U/L (ref 0–44)
AST: 21 U/L (ref 15–41)
Albumin: 4.3 g/dL (ref 3.5–5.0)
Alkaline Phosphatase: 45 U/L (ref 38–126)
Anion gap: 12 (ref 5–15)
BILIRUBIN TOTAL: 0.4 mg/dL (ref 0.3–1.2)
BUN: 10 mg/dL (ref 6–20)
CO2: 23 mmol/L (ref 22–32)
CREATININE: 0.53 mg/dL (ref 0.44–1.00)
Calcium: 9.1 mg/dL (ref 8.9–10.3)
Chloride: 106 mmol/L (ref 98–111)
GFR calc Af Amer: >60 mL/min (ref >60)
Glucose, Bld: 86 mg/dL (ref 70–99)
Potassium: 3 mmol/L — ABNORMAL LOW (ref 3.5–5.1)
Sodium: 141 mmol/L (ref 135–145)
TOTAL PROTEIN: 7.3 g/dL (ref 6.5–8.1)

## 2018-02-14 LAB — I-STAT BETA HCG BLOOD, ED (MC, WL, AP ONLY)

## 2018-02-14 LAB — CBC
HCT: 40 % (ref 36.0–46.0)
Hemoglobin: 13.4 g/dL (ref 12.0–15.0)
MCH: 30.9 pg (ref 26.0–34.0)
MCHC: 33.5 g/dL (ref 30.0–36.0)
MCV: 92.4 fL (ref 80.0–100.0)
Platelets: 301 10*3/uL (ref 150–400)
RBC: 4.33 MIL/uL (ref 3.87–5.11)
RDW: 12.7 % (ref 11.5–15.5)
WBC: 9.7 10*3/uL (ref 4.0–10.5)
nRBC: 0 % (ref 0.0–0.2)

## 2018-02-14 LAB — ETHANOL: Alcohol, Ethyl (B): 118 mg/dL — ABNORMAL HIGH (ref <10)

## 2018-02-14 LAB — ACETAMINOPHEN LEVEL: Acetaminophen (Tylenol), Serum: <10 ug/mL — ABNORMAL LOW (ref 10–30)

## 2018-02-14 LAB — RAPID URINE DRUG SCREEN, HOSP PERFORMED
Amphetamines: POSITIVE — AB
Barbiturates: NOT DETECTED
Benzodiazepines: NOT DETECTED
Cocaine: NOT DETECTED
Opiates: NOT DETECTED
Tetrahydrocannabinol: NOT DETECTED

## 2018-02-14 LAB — SALICYLATE LEVEL: Salicylate Lvl: <7 mg/dL (ref 2.8–30.0)

## 2018-02-14 MED ORDER — ACETAMINOPHEN 500 MG PO TABS: 1000.0000 mg | ORAL_TABLET | Freq: Four times a day (QID) | ORAL | Status: DC | PRN

## 2018-02-14 MED ORDER — NORETHINDRONE ACET-ETHINYL EST 1.5-30 MG-MCG PO TABS: 1.0000 | ORAL_TABLET | Freq: Every day | ORAL | Status: DC

## 2018-02-14 MED ORDER — AMPHETAMINE-DEXTROAMPHETAMINE 20 MG PO TABS
20.0000 mg | ORAL_TABLET | Freq: Two times a day (BID) | ORAL | Status: DC
  Administered 2018-02-14: 20 mg via ORAL
  Filled 2018-02-14: qty 1

## 2018-02-14 MED ORDER — CLONAZEPAM 0.5 MG PO TABS: 0.5000 mg | ORAL_TABLET | Freq: Two times a day (BID) | ORAL | Status: DC | PRN

## 2018-02-14 MED ORDER — PRENATAL MULTIVITAMIN CH
1.0000 | ORAL_TABLET | Freq: Every day | ORAL | Status: DC
  Filled 2018-02-14: qty 1

## 2018-02-14 MED ORDER — SUMATRIPTAN SUCCINATE 50 MG PO TABS
100.0000 mg | ORAL_TABLET | Freq: Once | ORAL | Status: DC | PRN
  Filled 2018-02-14: qty 2

## 2018-02-14 MED ORDER — FLUOXETINE HCL 20 MG PO CAPS
40.0000 mg | ORAL_CAPSULE | Freq: Every day | ORAL | Status: DC
  Administered 2018-02-14: 40 mg via ORAL
  Filled 2018-02-14: qty 2

## 2018-02-14 NOTE — Discharge Instructions (Signed)
For your behavioral health needs, you are advised to keep your previously scheduled appointment at the [facility].  Your appointment is Monday, February 19, 2018 at 2:00 pm:       [facility]      [address] [place], [place] [address]      [phone]

## 2018-02-14 NOTE — Consult Note (Addendum)
[facility] Psych ED Discharge  02/14/2018 12:32 PM Leslie [name]  MRN:  [id number] Principal Problem: Substance induced mood disorder [facility]) Discharge Diagnoses:  Patient Active Problem List   Diagnosis Date Noted  . Substance induced mood disorder (HCC) [F19.94] 02/14/2018  . Depression, recurrent (HCC) [F33.9] 01/30/2018  . Migraine headache without aura [G43.009] 07/29/2016  . Transient alteration of awareness [R40.4] 07/21/2015  . Generalized anxiety disorder [F41.1] 07/21/2015  . IBS (irritable bowel syndrome) [K58.9] 09/27/2011  . Nonspecific colitis [K52.9] 05/18/2011  . ABDOMINAL PAIN, GENERALIZED [R10.84] 04/22/2010  . Panic disorder without agoraphobia [F41.0] 12/24/2009  . ASTHMA [J45.909] 12/24/2009  . INSOMNIA, TRANSIENT [G47.00] 12/24/2009  . Attention deficit disorder [F98.8] 04/12/2006    Subjective: Patient reports today that she did make a comment yesterday, but she was intoxicated and she did not mean it. She reports that she is not suicidal or homicidal and denies hallucinations. She reports living with her fiance and she was home alone yesterday and was drinking. She reports having an appointment with the [facility] on 02-19-18 and that was confirmed. She reports having a history of cutting, but always superficial. She denies any previous suicide attempts.  Total Time spent with patient: 30 minutes  Past Psychiatric History: Previous hospitalizations, various medication trials, substance abuse and chronic history of self harm by cutting.   Past Medical History:  Past Medical History:  Diagnosis Date  . ADD 04/12/2006  . Anxiety   . ASTHMA 12/24/2009  . Depression   . IBS (irritable bowel syndrome)    NEGATIVE COLONSCOPY AND ENDOSCOPY  . INSOMNIA, TRANSIENT 12/24/2009  . PANIC ATTACK 12/24/2009  . Smoker     Past Surgical History:  Procedure Laterality Date  . COLONOSCOPY    . UPPER GASTROINTESTINAL ENDOSCOPY  2009   Family History:  Family History   Problem Relation Age of Onset  . Alcohol abuse Paternal Uncle   . Mental retardation Maternal Grandmother   . Hyperlipidemia Maternal Grandfather   . Diabetes Paternal Grandfather   . Hypertension Paternal Grandfather   . Heart disease Paternal Grandfather    Family Psychiatric  History: Both sides of family diagnosed with depression and alcohol abuse. 2 paternal uncles attempted suicide.  Social History:  Social History   Substance and Sexual Activity  Alcohol Use Yes  . Frequency: Never   Comment: Episodic use     Social History   Substance and Sexual Activity  Drug Use Not Currently    Social History   Socioeconomic History  . Marital status: Single    Spouse name: Not on file  . Number of children: 0  . Years of education: Not on file  . Highest education level: Not on file  Occupational History  . Occupation: Research scientist (medical)    Comment: Health visitor  Social Needs  . Financial resource strain: Not on file  . Food insecurity:    Worry: Not on file    Inability: Not on file  . Transportation needs:    Medical: Not on file    Non-medical: Not on file  Tobacco Use  . Smoking status: Current Every Day Smoker    Packs/day: 0.50    Years: 7.00    Pack years: 3.50    Types: Cigarettes  . Smokeless tobacco: Never Used  Substance and Sexual Activity  . Alcohol use: Yes    Frequency: Never    Comment: Episodic use  . Drug use: Not Currently  . Sexual activity: Not Currently  Birth control/protection: Pill  Lifestyle  . Physical activity:    Days per week: Not on file    Minutes per session: Not on file  . Stress: Not on file  Relationships  . Social connections:    Talks on phone: Not on file    Gets together: Not on file    Attends religious service: Not on file    Active member of club or organization: Not on file    Attends meetings of clubs or organizations: Not on file    Relationship status: Not on file  Other Topics Concern  . Not on file  Social  History Narrative   ** Merged History Encounter **       Pt is a 27 year old female who lives with mother and father.  Employed at [employer] and also goes to school full-time at [employer]  Currently on leave of absence.    Has this patient used any form of tobacco in the last 30 days? (Cigarettes, Smokeless Tobacco, Cigars, and/or Pipes) A prescription for an FDA-approved tobacco cessation medication was offered at discharge and the patient refused  Current Medications: Current Facility-Administered Medications  Medication Dose Route Frequency Provider Last Rate Last Dose  . acetaminophen (TYLENOL) tablet 1,000 mg  1,000 mg Oral Q6H PRN Garlon Hatchet, PA-C      . amphetamine-dextroamphetamine (ADDERALL) tablet 20 mg  20 mg Oral BID Garlon Hatchet, PA-C   20 mg at 02/14/18 1124  . clonazePAM (KLONOPIN) tablet 0.5 mg  0.5 mg Oral BID PRN Garlon Hatchet, PA-C      . FLUoxetine (PROZAC) capsule 40 mg  40 mg Oral Daily Garlon Hatchet, PA-C   40 mg at 02/14/18 1125  . Norethindrone Acetate-Ethinyl Estradiol (JUNEL,LOESTRIN,MICROGESTIN) 1.5-30 MG-MCG tablet 1 tablet  1 tablet Oral Daily Garlon Hatchet, PA-C      . prenatal multivitamin tablet 1 tablet  1 tablet Oral Q1200 Garlon Hatchet, PA-C      . SUMAtriptan (IMITREX) tablet 100 mg  100 mg Oral Once PRN Garlon Hatchet, PA-C       Current Outpatient Medications  Medication Sig Dispense Refill  . acetaminophen (TYLENOL) 500 MG tablet Take 1,000 mg by mouth every 6 (six) hours as needed for moderate pain.    Marland Kitchen amphetamine-dextroamphetamine (ADDERALL) 20 MG tablet Take 1 tablet (20 mg total) by mouth 2 (two) times daily. 60 tablet 0  . clonazePAM (KLONOPIN) 0.5 MG tablet Take 0.5 mg by mouth 2 (two) times daily as needed for anxiety.  5  . FLUoxetine (PROZAC) 40 MG capsule Take 1 capsule (40 mg total) by mouth daily. 90 capsule 1  . JUNEL 1.5/30 1.5-30 MG-MCG tablet TAKE 1 TABLET BY MOUTH EVERY DAY 21 tablet 1  . Prenatal Vit-Fe Fumarate-FA  (PRENATAL MULTIVITAMIN) TABS tablet Take 1 tablet by mouth daily at 12 noon.    . SUMAtriptan (IMITREX) 100 MG tablet TAKE 1 TABLET EVERY 2 HOURS AS NEEDED FOR MIGRAINE MAY REPEAT IN 2 HOURS IF HEADACHE PERSISTS 10 tablet 5   PTA Medications:  (Not in a hospital admission)  Musculoskeletal: Strength & Muscle Tone: within normal limits Gait & Station: normal Patient leans: N/A  Psychiatric Specialty Exam: Physical Exam  Nursing note and vitals reviewed. Constitutional: She is oriented to person, place, and time. She appears well-developed and well-nourished.  Cardiovascular: Normal rate.  Respiratory: Effort normal.  Musculoskeletal: Normal range of motion.  Neurological: She is alert and oriented to person, place,  and time.  Skin: Skin is warm.  Psychiatric: She has a normal mood and affect. Her speech is normal and behavior is normal. Judgment and thought content normal. Cognition and memory are normal.    Review of Systems  Constitutional: Negative.   HENT: Negative.   Eyes: Negative.   Respiratory: Negative.   Cardiovascular: Negative.   Gastrointestinal: Negative.   Genitourinary: Negative.   Musculoskeletal: Negative.   Skin: Negative.   Neurological: Negative.   Endo/Heme/Allergies: Negative.   Psychiatric/Behavioral: Positive for substance abuse. Negative for hallucinations and suicidal ideas.    Blood pressure 124/71, pulse 82, temperature 98.3 F (36.8 C), temperature source Oral, resp. rate 15, SpO2 98 %.There is no height or weight on file to calculate BMI.  General Appearance: Casual  Eye Contact:  Good  Speech:  Clear and Coherent and Normal Rate  Volume:  Normal  Mood:  Euthymic  Affect:  Congruent  Thought Process:  Coherent and Descriptions of Associations: Intact  Orientation:  Full (Time, Place, and Person)  Thought Content:  WDL  Suicidal Thoughts:  No  Homicidal Thoughts:  No  Memory:  Immediate;   Good Recent;   Good Remote;   Good  Judgement:   Fair  Insight:  Fair  Psychomotor Activity:  Normal  Concentration:  Concentration: Good and Attention Span: Good  Recall:  Good  Fund of Knowledge:  Good  Language:  Good  Akathisia:  No  Handed:  Right  AIMS (if indicated):   N/A  Assets:  Communication Skills Desire for Improvement Financial Resources/Insurance Housing Physical Health Resilience Transportation  ADL's:  Intact  Cognition:  WNL  Sleep:   N/A     Demographic Factors:  Caucasian and Low socioeconomic status  Loss Factors: NA  Historical Factors: Family history of suicide and Family history of mental illness or substance abuse  Risk Reduction Factors:   Positive social support  Continued Clinical Symptoms:  Alcohol/Substance Abuse/Dependencies  Cognitive Features That Contribute To Risk:  None    Suicide Risk:  Mild:  Suicidal ideation of limited frequency, intensity, duration, and specificity.  There are no identifiable plans, no associated intent, mild dysphoria and related symptoms, good self-control (both objective and subjective assessment), few other risk factors, and identifiable protective factors, including available and accessible social support.    Plan Of Care/Follow-up recommendations:  Continue activity as tolerated. Continue diet as recommended by your PCP. Ensure to keep all appointments with outpatient providers.  Disposition: Discharge home and follow up with [facility] on 02-19-18. Gerlene Burdock Money, FNP 02/14/2018, 12:32 PM   Patient seen face-to-face for psychiatric evaluation, chart reviewed and case discussed with the physician extender and developed treatment plan. Reviewed the information documented and agree with the treatment plan.  Juanetta Beets, DO 02/14/18 2:08 PM

## 2018-02-14 NOTE — ED Notes (Signed)
Patient given discharge teaching and verbalized understanding. Patient ambulated out of ED with a steady gait. 

## 2018-02-14 NOTE — ED Notes (Signed)
Patient is resting comfortably. 

## 2018-02-14 NOTE — ED Triage Notes (Signed)
Pt from home with c/o suicidal thoughts. Pt states she lost her friend to suicide by train 2 years ago. Pt states she had to leave work on a leave due to difficulty concentrating. Pt denies AVH and HI. No plan identified. Pt also has cut marks to left forearm. Pt states she has hx of cutting. Last time was about 2 weeks ago. Pt reports this was not a suicidal gesture. Pt has been drinking etoh; half a bottle of crown and a forth of a bottle of rum.

## 2018-02-14 NOTE — ED Notes (Signed)
Bed: [facility] Expected date:  Expected time:  Means of arrival:  Comments: 

## 2018-02-14 NOTE — ED Provider Notes (Signed)
[facility]-EMERGENCY DEPT Provider Note   CSN: [id number] Arrival date & time: 02/13/18  2344     History   Chief Complaint Chief Complaint  Patient presents with  . Suicidal    HPI Leslie [name] is a 27 y.o. female.  The history is provided by the patient and medical records.     27 year old female with history of ADD, anxiety, depression, presenting to the ED from behavioral health for medical clearance and holding for IP placement.  She was seen there due to increased depressive symptoms and suicidal ideation.  Patient reports she has started drinking heavily again recently as well as cutting.  She used to do this in the past and was doing well managing this up until about a month ago.  States " I just don't want to be alive anymore".  Patient admits to ongoing alcohol abuse including today, reports drinking half a bottle of [place] and a fourth a bottle of rum.  She denies any illicit drug use.  Patient does currently live with her parents.  States she was due to start therapy next week but given acutely worsening symptoms she decided to be seen sooner.  Past Medical History:  Diagnosis Date  . ADD 04/12/2006  . Anxiety   . ASTHMA 12/24/2009  . Depression   . IBS (irritable bowel syndrome)    NEGATIVE COLONSCOPY AND ENDOSCOPY  . INSOMNIA, TRANSIENT 12/24/2009  . PANIC ATTACK 12/24/2009  . Smoker     Patient Active Problem List   Diagnosis Date Noted  . Depression, recurrent (HCC) 01/30/2018  . Migraine headache without aura 07/29/2016  . Transient alteration of awareness 07/21/2015  . Generalized anxiety disorder 07/21/2015  . IBS (irritable bowel syndrome) 09/27/2011  . Nonspecific colitis 05/18/2011  . ABDOMINAL PAIN, GENERALIZED 04/22/2010  . Panic disorder without agoraphobia 12/24/2009  . ASTHMA 12/24/2009  . INSOMNIA, TRANSIENT 12/24/2009  . Attention deficit disorder 04/12/2006    Past Surgical History:  Procedure  Laterality Date  . COLONOSCOPY    . UPPER GASTROINTESTINAL ENDOSCOPY  2009     OB History   None      Home Medications    Prior to Admission medications   Medication Sig Start Date End Date Taking? Authorizing Provider  acetaminophen (TYLENOL) 500 MG tablet Take 1,000 mg by mouth every 6 (six) hours as needed for moderate pain.   Yes [provider]  amphetamine-dextroamphetamine (ADDERALL) 20 MG tablet Take 1 tablet (20 mg total) by mouth 2 (two) times daily. 10/13/17  Yes Burchette, Elberta Fortis, MD  clonazePAM (KLONOPIN) 0.5 MG tablet Take 0.5 mg by mouth 2 (two) times daily as needed for anxiety. 01/15/18  Yes [provider]  FLUoxetine (PROZAC) 40 MG capsule Take 1 capsule (40 mg total) by mouth daily. 12/29/17  Yes Burchette, Elberta Fortis, MD  JUNEL 1.5/30 1.5-30 MG-MCG tablet TAKE 1 TABLET BY MOUTH EVERY DAY 02/02/18  Yes Burchette, Elberta Fortis, MD  Prenatal Vit-Fe Fumarate-FA (PRENATAL MULTIVITAMIN) TABS tablet Take 1 tablet by mouth daily at 12 noon.   Yes [provider]  SUMAtriptan (IMITREX) 100 MG tablet TAKE 1 TABLET EVERY 2 HOURS AS NEEDED FOR MIGRAINE MAY REPEAT IN 2 HOURS IF HEADACHE PERSISTS 10/13/17  Yes Burchette, Elberta Fortis, MD  ALPRAZolam Prudy Feeler) 1 MG tablet Take 1 tablet (1 mg total) by mouth 3 (three) times daily as needed for anxiety. Patient not taking: Reported on 02/14/2018 12/27/17   Kristian Covey, MD  amphetamine-dextroamphetamine (ADDERALL)  20 MG tablet Take 1 tablet (20 mg total) by mouth 2 (two) times daily. May fill in two months Patient not taking: Reported on 02/14/2018 10/13/17   Kristian Covey, MD  amphetamine-dextroamphetamine (ADDERALL) 20 MG tablet Take 1 tablet (20 mg total) by mouth 2 (two) times daily. May fill in one month Patient not taking: Reported on 02/14/2018 10/13/17   Kristian Covey, MD  valACYclovir (VALTREX) 1000 MG tablet take two at onset of cold sore and repeat two in 12 hours 1 Patient not taking: Reported on 02/14/2018  06/23/17   Kristian Covey, MD    Family History Family History  Problem Relation Age of Onset  . Alcohol abuse Paternal Uncle   . Mental retardation Maternal Grandmother   . Hyperlipidemia Maternal Grandfather   . Diabetes Paternal Grandfather   . Hypertension Paternal Grandfather   . Heart disease Paternal Grandfather     Social History Social History   Tobacco Use  . Smoking status: Current Every Day Smoker    Packs/day: 0.50    Years: 7.00    Pack years: 3.50    Types: Cigarettes  . Smokeless tobacco: Never Used  Substance Use Topics  . Alcohol use: Yes    Frequency: Never    Comment: Episodic use  . Drug use: Not Currently     Allergies   Prednisone; Buspirone; Carafate [sucralfate]; and Meloxicam   Review of Systems Review of Systems  Psychiatric/Behavioral: Positive for suicidal ideas.  All other systems reviewed and are negative.    Physical Exam Updated Vital Signs BP 125/82 (BP Location: Left Arm)   Pulse 99   Temp 98.3 F (36.8 C) (Oral)   Resp 18   SpO2 100%   Physical Exam  Constitutional: She is oriented to person, place, and time. She appears well-developed and well-nourished.  HENT:  Head: Normocephalic and atraumatic.  Mouth/Throat: Oropharynx is clear and moist.  Eyes: Pupils are equal, round, and reactive to light. Conjunctivae and EOM are normal.  Neck: Normal range of motion.  Cardiovascular: Normal rate, regular rhythm and normal heart sounds.  Pulmonary/Chest: Effort normal and breath sounds normal. No stridor. No respiratory distress.  Abdominal: Soft. Bowel sounds are normal. There is no tenderness. There is no rebound.  Musculoskeletal: Normal range of motion.  Neurological: She is alert and oriented to person, place, and time.  Skin: Skin is warm and dry.  Psychiatric:  Appears depressed w/ SI Denies HI/AVH  Nursing note and vitals reviewed.    ED Treatments / Results  Labs (all labs ordered are listed, but only  abnormal results are displayed) Labs Reviewed  COMPREHENSIVE METABOLIC PANEL - Abnormal; Notable for the following components:      Result Value   Potassium 3.0 (*)    All other components within normal limits  ETHANOL - Abnormal; Notable for the following components:   Alcohol, Ethyl (B) 118 (*)    All other components within normal limits  ACETAMINOPHEN LEVEL - Abnormal; Notable for the following components:   Acetaminophen (Tylenol), Serum <10 (*)    All other components within normal limits  RAPID URINE DRUG SCREEN, HOSP PERFORMED - Abnormal; Notable for the following components:   Amphetamines POSITIVE (*)    All other components within normal limits  SALICYLATE LEVEL  CBC  I-STAT BETA HCG BLOOD, ED ([facility], [facility], [facility] ONLY)    EKG None  Radiology No results found.  Procedures Procedures (including critical care time)  Medications Ordered in ED Medications -  No data to display   Initial Impression / Assessment and Plan / ED Course  I have reviewed the triage vital signs and the nursing notes.  Pertinent labs & imaging results that were available during my care of the patient were reviewed by me and considered in my medical decision making (see chart for details).  27 y.o. F here from [facility] for medical clearance and awaiting IP placement.  Continues to report SI but denies HI/AVH.  She is willing to comply with treatment.  Labs here with ethanol 118 and UDS + for amphetamines, otherwise largely reassuring.  Medically cleared.  Home meds ordered.  Final Clinical Impressions(s) / ED Diagnoses   Final diagnoses:  Suicidal ideation  Depression, unspecified depression type    ED Discharge Orders    None       Garlon Hatchet, PA-C 02/14/18 0520    Dione Booze, MD 02/14/18 2325

## 2018-02-14 NOTE — BH Assessment (Signed)
[facility] Assessment Progress Note  Per Juanetta Beets, DO, this pt does not require psychiatric hospitalization at this time.  Pt is to be discharged from [facility].  Pt has an intake appointment at the [facility] scheduled for Monday, 02/19/2018 at 14:00.  This has been included in pt's discharge instructions.  Pt's nurse has been notified.  Doylene Canning, MA Triage Specialist [phone]

## 2018-02-14 NOTE — Telephone Encounter (Signed)
Copied from CRM [id number]. Topic: Quick Communication - See Telephone Encounter >> Feb 14, 2018 12:45 PM Herby Abraham C wrote: CRM for notification. See Telephone encounter for: 02/14/18.  [employer] is calling in to follow up on medical records request. Please confirm receipt.   [phone]

## 2018-02-22 ENCOUNTER — Encounter: Payer: Self-pay | Admitting: Family Medicine

## 2018-02-23 NOTE — Telephone Encounter (Signed)
Unable to confirm because don't have a phone  #

## 2018-03-02 ENCOUNTER — Encounter: Payer: Self-pay | Admitting: Family Medicine

## 2018-03-02 ENCOUNTER — Ambulatory Visit (INDEPENDENT_AMBULATORY_CARE_PROVIDER_SITE_OTHER): Payer: BLUE CROSS/BLUE SHIELD | Admitting: Family Medicine

## 2018-03-02 VITALS — BP 104/58 | HR 101 | Temp 98.1°F | Ht 63.5 in | Wt 107.5 lb

## 2018-03-02 DIAGNOSIS — F339 Major depressive disorder, recurrent, unspecified: Secondary | ICD-10-CM

## 2018-03-02 NOTE — Progress Notes (Signed)
  Subjective:     Patient ID: Leslie [name], female   DOB: 1990/12/10, 27 y.o.   MRN: [id number]  HPI Patient seen for follow-up regarding depression.  She has had a couple of ER visits recently for suicidal gestures with self cutting behaviors.  Most recent visit was November 6 and she had been binging on alcohol.  She denies any regular use of alcohol or any other drugs.  She denies any current suicidal ideation.  We had referred her to psychiatrist but when she went she had high co-pay and was unable to make the co-pay.  She refused to reschedule.  She is looking at possible walk-in psychiatry clinic in [place] next Wednesday.  Currently taking Prozac 40 mg once daily.  She also has been on Klonopin for some time.  She has been out of work since September 29.  Past Medical History:  Diagnosis Date  . ADD 04/12/2006  . Anxiety   . ASTHMA 12/24/2009  . Depression   . IBS (irritable bowel syndrome)    NEGATIVE COLONSCOPY AND ENDOSCOPY  . INSOMNIA, TRANSIENT 12/24/2009  . PANIC ATTACK 12/24/2009  . Smoker    Past Surgical History:  Procedure Laterality Date  . COLONOSCOPY    . UPPER GASTROINTESTINAL ENDOSCOPY  2009    reports that she has been smoking cigarettes. She has a 3.50 pack-year smoking history. She has never used smokeless tobacco. She reports that she drinks alcohol. She reports that she has current or past drug history. family history includes Alcohol abuse in her paternal uncle; Diabetes in her paternal grandfather; Heart disease in her paternal grandfather; Hyperlipidemia in her maternal grandfather; Hypertension in her paternal grandfather; Mental retardation in her maternal grandmother. Allergies  Allergen Reactions  . Prednisone Anaphylaxis  . Buspirone     "made her feel weird"  . Carafate [Sucralfate] Hives  . Meloxicam Other (See Comments)    GI upset     Review of Systems  Constitutional: Negative for appetite change and unexpected weight change.   Psychiatric/Behavioral: Negative for agitation, confusion, hallucinations and suicidal ideas.       Objective:   Physical Exam  Constitutional: She appears well-developed and well-nourished.  Cardiovascular: Normal rate and regular rhythm.  Pulmonary/Chest: Effort normal and breath sounds normal.  Psychiatric: She has a normal mood and affect. Her behavior is normal. Judgment and thought content normal.       Assessment:     History of recurrent depression with some recent self hurt behaviors currently stable on Prozac 40 mg daily    Plan:     -We have stressed multiple times our recommendation that she see psychiatry to go over medications and for further management.  May benefit from mood stabilizer. -She knows to follow-up immediately for any suicidal ideation  Leslie CoveyBruce W Namiko Pritts MD [facility]

## 2018-03-06 ENCOUNTER — Encounter: Payer: Self-pay | Admitting: Family Medicine

## 2018-03-15 ENCOUNTER — Other Ambulatory Visit: Payer: Self-pay | Admitting: Family Medicine

## 2018-03-20 NOTE — Telephone Encounter (Signed)
This was sent in for 5 refills on 01-15-18

## 2018-03-21 ENCOUNTER — Ambulatory Visit (INDEPENDENT_AMBULATORY_CARE_PROVIDER_SITE_OTHER): Payer: BLUE CROSS/BLUE SHIELD | Admitting: Family Medicine

## 2018-03-21 ENCOUNTER — Encounter: Payer: Self-pay | Admitting: Family Medicine

## 2018-03-21 ENCOUNTER — Other Ambulatory Visit: Payer: Self-pay

## 2018-03-21 VITALS — BP 110/64 | HR 107 | Temp 98.4°F | Ht 63.5 in | Wt 108.1 lb

## 2018-03-21 DIAGNOSIS — F339 Major depressive disorder, recurrent, unspecified: Secondary | ICD-10-CM

## 2018-03-21 DIAGNOSIS — F988 Other specified behavioral and emotional disorders with onset usually occurring in childhood and adolescence: Secondary | ICD-10-CM | POA: Diagnosis not present

## 2018-03-21 MED ORDER — CLONAZEPAM 0.5 MG PO TABS: 0.5000 mg | ORAL_TABLET | Freq: Two times a day (BID) | ORAL | 5 refills | Status: DC | PRN

## 2018-03-21 NOTE — Progress Notes (Signed)
  Subjective:     Patient ID: Leslie HavensKrista Nicole [name], female   DOB: [birth year], 27 y.o.   MRN: [id number]  HPI Is seen for follow-up regarding her recent depression issues.  Refer to prior notes for details.  She has had recurrent depression over several years.  She had couple of recent ER visits for suicidal gestures and self cutting behaviors.  Denies any current suicidal ideation.  She has been referred to psychiatry and she checked out multiple places but had problems because of cost.  She refused to go to [facility] because of previous bad experience there.  She does feel her depression is stable at this time.  She remains on Prozac 40 mg daily.  She denies any illicit drug use.  She has been out of work since September 29.  Her current medications include Prozac 40 mg daily and Klonopin 0.5 mg twice daily which she has been on for several years.  She takes Adderall 20 mg twice daily for ADD symptoms.  Appetite and weight are stable.  She has actually had some recent weight gain in the past year  Past Medical History:  Diagnosis Date  . ADD 04/12/2006  . Anxiety   . ASTHMA 12/24/2009  . Depression   . IBS (irritable bowel syndrome)    NEGATIVE COLONSCOPY AND ENDOSCOPY  . INSOMNIA, TRANSIENT 12/24/2009  . PANIC ATTACK 12/24/2009  . Smoker    Past Surgical History:  Procedure Laterality Date  . COLONOSCOPY    . UPPER GASTROINTESTINAL ENDOSCOPY  2009    reports that she has been smoking cigarettes. She has a 3.50 pack-year smoking history. She has never used smokeless tobacco. She reports that she drinks alcohol. She reports that she has current or past drug history. family history includes Alcohol abuse in her paternal uncle; Diabetes in her paternal grandfather; Heart disease in her paternal grandfather; Hyperlipidemia in her maternal grandfather; Hypertension in her paternal grandfather; Mental retardation in her maternal grandmother. Allergies  Allergen Reactions  . Prednisone Anaphylaxis   . Buspirone     "made her feel weird"  . Carafate [Sucralfate] Hives  . Meloxicam Other (See Comments)    GI upset     Review of Systems  Constitutional: Negative for appetite change and unexpected weight change.  Cardiovascular: Negative for chest pain.  Gastrointestinal: Negative for abdominal pain.  Psychiatric/Behavioral: Negative for agitation, hallucinations and suicidal ideas.       Objective:   Physical Exam  Constitutional: She appears well-developed and well-nourished.  Cardiovascular: Normal rate and regular rhythm.  Psychiatric: She has a normal mood and affect. Her behavior is normal.  PHQ 9 score of 12  Mood disorder questionnaire 8/13 on first section.       Assessment:     #1 history of recurrent depression currently stable on Prozac.  She does have question of some mood instability and some question of bipolar currently not on mood stabilizer but stable.  #2 history of attention deficit disorder    Plan:     -Complete [place] 2 papers.  She has been out of work since September 29 and we have recommended she return to work December 16 -We have strongly advocated again that she follow through with follow-up with psychiatry.  She is trying to secure financial means for that currently.  She declines treatment at [facility] -She agrees to follow-up immediately for any suicidal ideation or worsening symptoms.  Leslie CoveyBruce W Elgie Maziarz MD [facility]

## 2018-03-22 ENCOUNTER — Encounter: Payer: Self-pay | Admitting: Family Medicine

## 2018-03-23 ENCOUNTER — Telehealth: Payer: Self-pay | Admitting: *Deleted

## 2018-03-23 NOTE — Telephone Encounter (Signed)
Copied from CRM [phone]. Topic: General - Other >> Mar 20, 2018  4:43 PM Arlyss Gandyichardson, Taren N, NT wrote: Reason for CRM: Tirsa with [employer] states she faxed back over the [employer] form for this pt because she states they do not process these forms for this office, that they are done in house. CB#: [phone] >> Mar 21, 2018  8:20 AM Marquis Buggyverman, Brittany A wrote: This is the [place] office.

## 2018-03-26 ENCOUNTER — Other Ambulatory Visit: Payer: Self-pay | Admitting: Family Medicine

## 2018-03-26 MED ORDER — AMPHETAMINE-DEXTROAMPHETAMINE 20 MG PO TABS: ORAL_TABLET | ORAL | 0 refills | Status: DC

## 2018-03-26 MED ORDER — AMPHETAMINE-DEXTROAMPHETAMINE 20 MG PO TABS: 20.0000 mg | ORAL_TABLET | Freq: Two times a day (BID) | ORAL | 0 refills | Status: DC

## 2018-03-26 NOTE — Telephone Encounter (Signed)
Last OV 03/21/18, No future OV  Last filled 10/13/17, # 60 with 0 refills

## 2018-03-26 NOTE — Telephone Encounter (Signed)
Refilled Adderall for 3 months. 

## 2018-03-26 NOTE — Telephone Encounter (Signed)
Last OV 03/21/18, No future OV  Last filled 10/13/17, # 60 with 0 refills 

## 2018-03-28 ENCOUNTER — Other Ambulatory Visit: Payer: Self-pay | Admitting: Family Medicine

## 2018-05-14 ENCOUNTER — Other Ambulatory Visit: Payer: Self-pay | Admitting: Family Medicine

## 2018-06-01 ENCOUNTER — Other Ambulatory Visit: Payer: Self-pay | Admitting: Family Medicine

## 2018-06-01 NOTE — Telephone Encounter (Signed)
No longer on patient med list.  This note is on refill: The source prescription was discontinued on 02/14/2018 by Money, Gerlene Burdock, FNP for the following reason: Stop Taking at Discharge.

## 2018-06-02 NOTE — Telephone Encounter (Signed)
Denied.  We do not have her on Xanax.  Maybe this was prescribed per psychiatry?

## 2018-06-04 ENCOUNTER — Ambulatory Visit: Payer: Self-pay | Admitting: Family Medicine

## 2018-06-18 ENCOUNTER — Other Ambulatory Visit: Payer: Self-pay | Admitting: Family Medicine

## 2018-06-22 ENCOUNTER — Telehealth: Payer: Self-pay | Admitting: Family Medicine

## 2018-06-22 NOTE — Telephone Encounter (Signed)
Pt returned call and given message per notes of Megan,CMA on 06/1318. Pt verbalized understanding.

## 2018-06-22 NOTE — Telephone Encounter (Signed)
Called patient and LMOVM to return call  Ok for [facility] to Discuss results / PCP / recommendations / Schedule patient  I left a message to let patient know that Dr. Caryl Never is not in the office and if her symptoms are bad to go to the Urgent Care or the ER if she needs to. I also advised for her to call us Monday if she is still having problems.  CRM Created.

## 2018-06-22 NOTE — Telephone Encounter (Signed)
Copied from CRM [id number]. Topic: Quick Communication - See Telephone Encounter >> Jun 22, 2018  3:18 PM Jens Som A wrote: CRM for notification. See Telephone encounter for: 06/22/18.  Patient is calling for a script for pain or nausea. Patient offered appt for Monday. Patient declined. Please advise [phone]

## 2018-06-25 ENCOUNTER — Ambulatory Visit: Payer: Self-pay | Admitting: *Deleted

## 2018-06-25 NOTE — Telephone Encounter (Signed)
Pt reports dry cough,runny nose, fever and mild SOB with coughing.  Onset yesterday. States temp this AM 102.0, after tylenol 99.7, presently 99.4. States nose "Runny" clear mucous, cough is "Modrerate" non-productive. Reports "Mild" sob only with coughing spells. Denies CP, tightness. States "I get this way about this time every year." Also reports she has taken Mucinex DM, "Helped a little." Reports mild sinus tenderness,"Front of head." Has not traveled internationally. Attempted to secure appt for patient. Stated she will do so on MyChart as she has to secure transportation.  Care advise given per protocol. Encouraged pt to schedule appt for tomorrow, go to UC/ED if symptoms worsen.  Reason for Disposition . [1] Continuous (nonstop) coughing interferes with work or school AND [2] no improvement using cough treatment per protocol  Answer Assessment - Initial Assessment Questions 1. ONSET: "When did the cough begin?"      Yesterday 2. SEVERITY: "How bad is the cough today?"      Moderate 3. RESPIRATORY DISTRESS: "Describe your breathing."      Mild SOB, with coughing 4. FEVER: "Do you have a fever?" If so, ask: "What is your temperature, how was it measured, and when did it start?"     Yes . 102.0 this am early, presently after tylenol 99.4 5. HEMOPTYSIS: "Are you coughing up any blood?" If so ask: "How much?" (flecks, streaks, tablespoons, etc.)     no 6. TREATMENT: "What have you done so far to treat the cough?" (e.g., meds, fluids, humidifier)     Claritin and mucinex DM, "Helped with runny nose only." 7. CARDIAC HISTORY: "Do you have any history of heart disease?" (e.g., heart attack, congestive heart failure)      no 8. LUNG HISTORY: "Do you have any history of lung disease?"  (e.g., pulmonary embolus, asthma, emphysema)     no 9. PE RISK FACTORS: "Do you have a history of blood clots?" (or: recent major surgery, recent prolonged travel, bedridden)     no 10. OTHER SYMPTOMS: "Do you  have any other symptoms? (e.g., runny nose, wheezing, chest pain)      Runny nose, clear 11. PREGNANCY: "Is there any chance you are pregnant?" "When was your last menstrual period?"       no 12. TRAVEL: "Have you traveled out of the country in the last month?" (e.g., travel history, exposures)       no  Protocols used: COUGH - ACUTE NON-PRODUCTIVE-A-AH

## 2018-06-26 NOTE — Telephone Encounter (Signed)
Agree that pt needs to be assessed with cough, fever, dyspnea

## 2018-06-27 NOTE — Telephone Encounter (Signed)
Called patient and LMOVM to return call  Ok for [facility] to Discuss results / PCP / recommendations / Schedule patient  Kandee Keen is seeing patients in our office outside this afternoon and patient will need to go to the tent in the back of our building if patient still needs to be seen.  CRM Created.

## 2018-07-05 ENCOUNTER — Emergency Department (HOSPITAL_COMMUNITY): Payer: Self-pay

## 2018-07-05 ENCOUNTER — Encounter (HOSPITAL_COMMUNITY): Payer: Self-pay | Admitting: Emergency Medicine

## 2018-07-05 ENCOUNTER — Emergency Department (HOSPITAL_COMMUNITY)
Admission: EM | Admit: 2018-07-05 | Discharge: 2018-07-05 | Disposition: A | Discharge status: Home / Self Care | Payer: Self-pay | Attending: Emergency Medicine | Admitting: Emergency Medicine

## 2018-07-05 ENCOUNTER — Other Ambulatory Visit: Payer: Self-pay

## 2018-07-05 DIAGNOSIS — K529 Noninfective gastroenteritis and colitis, unspecified: Secondary | ICD-10-CM | POA: Insufficient documentation

## 2018-07-05 DIAGNOSIS — E876 Hypokalemia: Secondary | ICD-10-CM | POA: Insufficient documentation

## 2018-07-05 DIAGNOSIS — R197 Diarrhea, unspecified: Secondary | ICD-10-CM

## 2018-07-05 DIAGNOSIS — F17210 Nicotine dependence, cigarettes, uncomplicated: Secondary | ICD-10-CM | POA: Insufficient documentation

## 2018-07-05 DIAGNOSIS — Z79899 Other long term (current) drug therapy: Secondary | ICD-10-CM | POA: Insufficient documentation

## 2018-07-05 DIAGNOSIS — J45909 Unspecified asthma, uncomplicated: Secondary | ICD-10-CM | POA: Insufficient documentation

## 2018-07-05 LAB — CBC WITH DIFFERENTIAL/PLATELET
ABS IMMATURE GRANULOCYTES: 0.04 10*3/uL (ref 0.00–0.07)
BASOS ABS: 0.1 10*3/uL (ref 0.0–0.1)
Basophils Relative: 1 %
Eosinophils Absolute: 0 10*3/uL (ref 0.0–0.5)
Eosinophils Relative: 1 %
HEMATOCRIT: 42.4 % (ref 36.0–46.0)
Hemoglobin: 14.1 g/dL (ref 12.0–15.0)
Immature Granulocytes: 1 %
LYMPHS ABS: 2.1 10*3/uL (ref 0.7–4.0)
Lymphocytes Relative: 25 %
MCH: 32.1 pg (ref 26.0–34.0)
MCHC: 33.3 g/dL (ref 30.0–36.0)
MCV: 96.6 fL (ref 80.0–100.0)
MONO ABS: 0.5 10*3/uL (ref 0.1–1.0)
Monocytes Relative: 7 %
NEUTROS ABS: 5.5 10*3/uL (ref 1.7–7.7)
NEUTROS PCT: 65 %
NRBC: 0 % (ref 0.0–0.2)
Platelets: 282 10*3/uL (ref 150–400)
RBC: 4.39 MIL/uL (ref 3.87–5.11)
RDW: 12.3 % (ref 11.5–15.5)
WBC: 8.3 10*3/uL (ref 4.0–10.5)

## 2018-07-05 LAB — URINALYSIS, ROUTINE W REFLEX MICROSCOPIC
BILIRUBIN URINE: NEGATIVE
Glucose, UA: NEGATIVE mg/dL
Hgb urine dipstick: NEGATIVE
KETONES UR: NEGATIVE mg/dL
Leukocytes,Ua: NEGATIVE
NITRITE: NEGATIVE
Protein, ur: NEGATIVE mg/dL
Specific Gravity, Urine: 1.002 — ABNORMAL LOW (ref 1.005–1.030)
pH: 6 (ref 5.0–8.0)

## 2018-07-05 LAB — COMPREHENSIVE METABOLIC PANEL
ALBUMIN: 4.7 g/dL (ref 3.5–5.0)
ALT: 14 U/L (ref 0–44)
ANION GAP: 13 (ref 5–15)
AST: 18 U/L (ref 15–41)
Alkaline Phosphatase: 53 U/L (ref 38–126)
BUN: 6 mg/dL (ref 6–20)
CO2: 21 mmol/L — AB (ref 22–32)
Calcium: 10.2 mg/dL (ref 8.9–10.3)
Chloride: 104 mmol/L (ref 98–111)
Creatinine, Ser: 0.71 mg/dL (ref 0.44–1.00)
GFR calc non Af Amer: >60 mL/min (ref >60)
Glucose, Bld: 88 mg/dL (ref 70–99)
Potassium: 2.7 mmol/L — CL (ref 3.5–5.1)
SODIUM: 138 mmol/L (ref 135–145)
Total Bilirubin: 0.5 mg/dL (ref 0.3–1.2)
Total Protein: 7.9 g/dL (ref 6.5–8.1)

## 2018-07-05 LAB — LACTIC ACID, PLASMA
LACTIC ACID, VENOUS: 2.1 mmol/L — AB (ref 0.5–1.9)
LACTIC ACID, VENOUS: 1.2 mmol/L (ref 0.5–1.9)

## 2018-07-05 LAB — I-STAT BETA HCG BLOOD, ED (MC, WL, AP ONLY)

## 2018-07-05 LAB — LIPASE, BLOOD: LIPASE: 27 U/L (ref 11–51)

## 2018-07-05 LAB — D-DIMER, QUANTITATIVE: D-Dimer, Quant: <0.27 ug/mL-FEU (ref 0.00–0.50)

## 2018-07-05 LAB — POC OCCULT BLOOD, ED: Fecal Occult Bld: NEGATIVE

## 2018-07-05 MED ORDER — SODIUM CHLORIDE (PF) 0.9 % IJ SOLN
INTRAMUSCULAR | Status: AC
  Administered 2018-07-05: 22:00:00
  Filled 2018-07-05: qty 50

## 2018-07-05 MED ORDER — DICYCLOMINE HCL 10 MG PO CAPS
10.0000 mg | ORAL_CAPSULE | Freq: Once | ORAL | Status: AC
  Administered 2018-07-05: 10 mg via ORAL
  Filled 2018-07-05: qty 1

## 2018-07-05 MED ORDER — POTASSIUM CHLORIDE 10 MEQ/100ML IV SOLN
10.0000 meq | Freq: Once | INTRAVENOUS | Status: AC
  Administered 2018-07-05: 10 meq via INTRAVENOUS
  Filled 2018-07-05: qty 100

## 2018-07-05 MED ORDER — CIPROFLOXACIN HCL 500 MG PO TABS: 500.0000 mg | ORAL_TABLET | Freq: Two times a day (BID) | ORAL | 0 refills | Status: AC

## 2018-07-05 MED ORDER — SODIUM CHLORIDE 0.9 % IV BOLUS
1000.0000 mL | Freq: Once | INTRAVENOUS | Status: AC
  Administered 2018-07-05: 1000 mL via INTRAVENOUS

## 2018-07-05 MED ORDER — ALBUTEROL SULFATE HFA 108 (90 BASE) MCG/ACT IN AERS
1.0000 | INHALATION_SPRAY | Freq: Once | RESPIRATORY_TRACT | Status: AC
  Administered 2018-07-05: 1 via RESPIRATORY_TRACT
  Filled 2018-07-05: qty 6.7

## 2018-07-05 MED ORDER — METRONIDAZOLE 500 MG PO TABS
500.0000 mg | ORAL_TABLET | Freq: Once | ORAL | Status: AC
  Administered 2018-07-05: 500 mg via ORAL
  Filled 2018-07-05: qty 1

## 2018-07-05 MED ORDER — IOHEXOL 300 MG/ML  SOLN
75.0000 mL | Freq: Once | INTRAMUSCULAR | Status: AC | PRN
  Administered 2018-07-05: 75 mL via INTRAVENOUS

## 2018-07-05 MED ORDER — METRONIDAZOLE 500 MG PO TABS: 500.0000 mg | ORAL_TABLET | Freq: Two times a day (BID) | ORAL | 0 refills | Status: DC

## 2018-07-05 MED ORDER — CIPROFLOXACIN HCL 500 MG PO TABS
500.0000 mg | ORAL_TABLET | Freq: Once | ORAL | Status: AC
  Administered 2018-07-05: 500 mg via ORAL
  Filled 2018-07-05: qty 1

## 2018-07-05 MED ORDER — ONDANSETRON HCL 4 MG/2ML IJ SOLN
4.0000 mg | Freq: Once | INTRAMUSCULAR | Status: AC
  Administered 2018-07-05: 4 mg via INTRAVENOUS
  Filled 2018-07-05: qty 2

## 2018-07-05 MED ORDER — IOHEXOL 300 MG/ML  SOLN
30.0000 mL | Freq: Once | INTRAMUSCULAR | Status: AC | PRN
  Administered 2018-07-05: 30 mL via ORAL

## 2018-07-05 MED ORDER — DICYCLOMINE HCL 20 MG PO TABS: 20.0000 mg | ORAL_TABLET | Freq: Two times a day (BID) | ORAL | 0 refills | Status: DC

## 2018-07-05 MED ORDER — MORPHINE SULFATE (PF) 4 MG/ML IV SOLN
4.0000 mg | Freq: Once | INTRAVENOUS | Status: AC
  Administered 2018-07-05: 4 mg via INTRAVENOUS
  Filled 2018-07-05: qty 1

## 2018-07-05 MED ORDER — POTASSIUM CHLORIDE ER 10 MEQ PO TBCR: 10.0000 meq | EXTENDED_RELEASE_TABLET | Freq: Every day | ORAL | 0 refills | Status: DC

## 2018-07-05 NOTE — ED Provider Notes (Addendum)
[facility]-EMERGENCY DEPT Provider Note   CSN: [id number] Arrival date & time: 07/05/18  1736    History   Chief Complaint Chief Complaint  Patient presents with   Fever   Diarrhea   Tachycardia    HPI Leslie [name] is a 28 y.o. female has medical history of ADD, asthma, anxiety, IBS who presents for evaluation of multiple complaints that have been ongoing for the last week and a half.  She states that for the last week and a half, she has had multiple episodes of diarrhea.  She states initially, it was nonbloody but states that over the last few days, she has noticed some bright red blood on the toilet paper as well as in the stool.  No black tarry stools.  She also reports some occasional tenesmus.  Additionally, patient reports intermittent abdominal pain.  She states that it is diffusely in the lower abdomen and particularly worse on the right.  She states that it is slightly worse right before she has a bowel movement but states it is not improved after she has a bowel movement.  She reports he is still been able to eat and drink without any difficulty but does state that sometimes when she eats, it makes her pain worse.  She has not any nausea/vomiting.  Patient states that she has been measuring her temperature at home and states it is been as high as 101.1.  She states that the lowest it has been is 99.2.  She is taking Tylenol for fever relief.  Additionally, patient reports that she has developed some shortness of breath over last few days.  She states that it is worse with exertion and states that she gets winded while walking around which is abnormal for her.  Patient also feels like her heart rate is beating very fast.  Patient states that she is not currently having any chest pain.  Patient states she has a history of GI issues and has been evaluated by GI previously in the past.  Last time she saw them was approxi-6 years ago.  Her last colonoscopy  was in 2012.  At that time, they saw some erythema and friability noted at the sigmoid colon but otherwise did not find any abnormalities.  Patient is a current smoker.  She does report a history of asthma and states she has been using her inhalers more frequently but she does not feel like that is helping her shortness of breath.  Reports she will occasionally have a dry cough but states that that has improved over the last week and a half.  Patient denies any dysuria, hematuria, vomiting.  Patient is currently on OCPs but she states she has not taken in last several days. She denies any recent immobilization, prior history of DVT/PE, recent surgery, leg swelling, or long travel.     The history is provided by the patient.    Past Medical History:  Diagnosis Date   ADD 04/12/2006   Anxiety    ASTHMA 12/24/2009   Depression    IBS (irritable bowel syndrome)    NEGATIVE COLONSCOPY AND ENDOSCOPY   INSOMNIA, TRANSIENT 12/24/2009   PANIC ATTACK 12/24/2009   Smoker     Patient Active Problem List   Diagnosis Date Noted   Substance induced mood disorder (HCC) 02/14/2018   Depression, recurrent (HCC) 01/30/2018   Migraine headache without aura 07/29/2016   Transient alteration of awareness 07/21/2015   Generalized anxiety disorder 07/21/2015   IBS (  irritable bowel syndrome) 09/27/2011   Nonspecific colitis 05/18/2011   ABDOMINAL PAIN, GENERALIZED 04/22/2010   Panic disorder without agoraphobia 12/24/2009   ASTHMA 12/24/2009   INSOMNIA, TRANSIENT 12/24/2009   Attention deficit disorder 04/12/2006    Past Surgical History:  Procedure Laterality Date   COLONOSCOPY     UPPER GASTROINTESTINAL ENDOSCOPY  2009     OB History   No obstetric history on file.      Home Medications    Prior to Admission medications   Medication Sig Start Date End Date Taking? Authorizing Provider  acetaminophen (TYLENOL) 500 MG tablet Take 500 mg by mouth every 6 (six) hours as  needed for moderate pain.   Yes [provider]  albuterol (PROVENTIL HFA;VENTOLIN HFA) 108 (90 Base) MCG/ACT inhaler Inhale 2 puffs into the lungs every 6 (six) hours as needed for wheezing or shortness of breath.   Yes [provider]  amphetamine-dextroamphetamine (ADDERALL) 20 MG tablet Take 1 tablet (20 mg total) by mouth 2 (two) times daily. Patient taking differently: Take 20 mg by mouth daily.  03/26/18  Yes Burchette, Elberta Fortis, MD  clonazePAM (KLONOPIN) 0.5 MG tablet Take 1 tablet (0.5 mg total) by mouth 2 (two) times daily as needed for anxiety. Patient taking differently: Take 0.5 mg by mouth 2 (two) times daily.  03/21/18  Yes Burchette, Elberta Fortis, MD  guaifenesin (ROBITUSSIN) 100 MG/5ML syrup Take 200 mg by mouth 3 (three) times daily as needed for cough.   Yes [provider]  JUNEL 1.5/30 1.5-30 MG-MCG tablet TAKE ONE TABLET BY MOUTH DAILY 06/18/18  Yes Burchette, Elberta Fortis, MD  Pseudoeph-Doxylamine-DM-APAP (NYQUIL PO) Take 30 mLs by mouth daily as needed (cold symptoms).   Yes [provider]  amphetamine-dextroamphetamine (ADDERALL) 20 MG tablet Take one tablet twice daily.  May refill in one month. Patient not taking: Reported on 07/05/2018 03/26/18   Kristian Covey, MD  amphetamine-dextroamphetamine (ADDERALL) 20 MG tablet Take one tablet twice daily.  May refill in two months. Patient not taking: Reported on 07/05/2018 03/26/18   Kristian Covey, MD  ciprofloxacin (CIPRO) 500 MG tablet Take 1 tablet (500 mg total) by mouth every 12 (twelve) hours for 7 days. 07/05/18 07/12/18  Maxwell Caul, PA-C  FLUoxetine (PROZAC) 40 MG capsule Take 1 capsule (40 mg total) by mouth daily. Patient not taking: Reported on 07/05/2018 12/29/17   Kristian Covey, MD  metroNIDAZOLE (FLAGYL) 500 MG tablet Take 1 tablet (500 mg total) by mouth 2 (two) times daily. 07/05/18   Graciella Freer A, PA-C  potassium chloride (K-DUR) 10 MEQ tablet Take 1 tablet (10 mEq  total) by mouth daily. 07/05/18   Maxwell Caul, PA-C  SUMAtriptan (IMITREX) 100 MG tablet TAKE 1 TABLET EVERY 2 HOURS AS NEEDED FOR MIGRAINE MAY REPEAT IN 2 HOURS IF HEADACHE PERSISTS Patient taking differently: Take 100 mg by mouth every 2 (two) hours as needed for migraine. TAKE 1 TABLET EVERY 2 HOURS AS NEEDED FOR MIGRAINE MAY REPEAT IN 2 HOURS IF HEADACHE PERSISTS 05/14/18   Burchette, Elberta Fortis, MD    Family History Family History  Problem Relation Age of Onset   Alcohol abuse Paternal Uncle    Mental retardation Maternal Grandmother    Hyperlipidemia Maternal Grandfather    Diabetes Paternal Grandfather    Hypertension Paternal Grandfather    Heart disease Paternal Grandfather     Social History Social History   Tobacco Use   Smoking status: Current Every Day Smoker  Packs/day: 0.50    Years: 7.00    Pack years: 3.50    Types: Cigarettes   Smokeless tobacco: Never Used  Substance Use Topics   Alcohol use: Yes    Frequency: Never    Comment: Episodic use   Drug use: Not Currently     Allergies   Prednisone; Buspirone; Carafate [sucralfate]; and Meloxicam   Review of Systems Review of Systems  Constitutional: Positive for fever.  HENT: Negative for congestion.   Respiratory: Positive for cough and shortness of breath.   Cardiovascular: Negative for chest pain.  Gastrointestinal: Positive for abdominal pain, blood in stool, diarrhea, nausea and vomiting.  Genitourinary: Negative for dysuria and hematuria.  Neurological: Negative for headaches.  All other systems reviewed and are negative.    Physical Exam Updated Vital Signs BP (!) 116/93    Pulse 89    Temp 100.2 F (37.9 C) (Rectal)    Resp 20    Ht 5' 4.5" (1.638 m)    Wt 47.6 kg    LMP 06/28/2018    SpO2 99%    BMI 17.74 kg/m   Physical Exam Vitals signs and nursing note reviewed. Exam conducted with a chaperone present.  Constitutional:      Appearance: Normal appearance. She is  well-developed.  HENT:     Head: Normocephalic and atraumatic.  Eyes:     General: Lids are normal.     Conjunctiva/sclera: Conjunctivae normal.     Pupils: Pupils are equal, round, and reactive to light.  Neck:     Musculoskeletal: Full passive range of motion without pain.  Cardiovascular:     Rate and Rhythm: Normal rate and regular rhythm.     Pulses: Normal pulses.     Heart sounds: Normal heart sounds. No murmur. No friction rub. No gallop.   Pulmonary:     Effort: Pulmonary effort is normal. Tachypnea present.     Breath sounds: Normal breath sounds.     Comments: Lungs clear to auscultation bilaterally.  Symmetric chest rise.  No wheezing, rales, rhonchi.  Patient is tachypneic and seems to be taking short shallow breaths.  No evidence of respiratory distress.  Able to speak in full sentences without any difficulty. Abdominal:     Palpations: Abdomen is soft. Abdomen is not rigid.     Tenderness: There is abdominal tenderness in the right lower quadrant. There is no guarding.     Comments: Tenderness palpation of the right lower quadrant that  Genitourinary:    Rectum: Normal. Guaiac result negative. No mass or tenderness.     Comments: The exam was performed with a chaperone present. Digital Rectal Exam reveals sphincter with good tone. No external hemorrhoids. No masses or fissures. Stool color is brown with no overt blood. Musculoskeletal: Normal range of motion.  Skin:    General: Skin is warm and dry.     Capillary Refill: Capillary refill takes less than 2 seconds.  Neurological:     Mental Status: She is alert and oriented to person, place, and time.  Psychiatric:        Speech: Speech normal.      ED Treatments / Results  Labs (all labs ordered are listed, but only abnormal results are displayed) Labs Reviewed  COMPREHENSIVE METABOLIC PANEL - Abnormal; Notable for the following components:      Result Value   Potassium 2.7 (*)    CO2 21 (*)    All other  components within normal limits  URINALYSIS,  ROUTINE W REFLEX MICROSCOPIC - Abnormal; Notable for the following components:   Color, Urine STRAW (*)    APPearance HAZY (*)    Specific Gravity, Urine 1.002 (*)    All other components within normal limits  LACTIC ACID, PLASMA - Abnormal; Notable for the following components:   Lactic Acid, Venous 2.1 (*)    All other components within normal limits  CBC WITH DIFFERENTIAL/PLATELET  LIPASE, BLOOD  LACTIC ACID, PLASMA  D-DIMER, QUANTITATIVE (NOT AT [facility])  POC OCCULT BLOOD, ED  I-STAT BETA HCG BLOOD, ED ([facility], [facility], [facility] ONLY)    EKG EKG Interpretation  Date/Time:  Thursday July 05 2018 17:46:13 EDT Ventricular Rate:  156 PR Interval:    QRS Duration: 82 QT Interval:  248 QTC Calculation: 400 R Axis:   82 Text Interpretation:  Sinus tachycardia LAE, consider biatrial enlargement LVH with secondary repolarization abnormality ST depr, consider ischemia, inferior leads Baseline wander in lead(s) II III aVR aVL aVF V3 V5 V6 No previous tracing Confirmed by Gwyneth Sprout ([id number]) on 07/05/2018 6:10:02 PM   Radiology Dg Chest 2 View  Result Date: 07/05/2018 CLINICAL DATA:  For past week had diarrhea and fevers. Pt reports has had a abd pains and rectal bleeding, feels her HR has been high. Pt reports nausea but denies vomiting. PT HX: asthma, current smoker EXAM: CHEST - 2 VIEW COMPARISON:  09/27/2017 FINDINGS: Normal heart, mediastinum and hila. Clear lungs.  No pleural effusion or pneumothorax. Skeletal structures are within normal limits. IMPRESSION: No active cardiopulmonary disease. Electronically Signed   By: Amie Portland M.D.   On: 07/05/2018 19:06   Ct Abdomen Pelvis W Contrast  Result Date: 07/05/2018 CLINICAL DATA:  Diarrhea and fever for the past week. Abdominal pain and rectal bleeding. EXAM: CT ABDOMEN AND PELVIS WITH CONTRAST TECHNIQUE: Multidetector CT imaging of the abdomen and pelvis was performed using the standard protocol  following bolus administration of intravenous contrast. CONTRAST:  8mL OMNIPAQUE IOHEXOL 300 MG/ML SOLN, 7mL OMNIPAQUE IOHEXOL 300 MG/ML SOLN COMPARISON:  None. FINDINGS: Lower chest: No acute abnormality. Hepatobiliary: No focal liver abnormality is seen. No gallstones, gallbladder wall thickening, or biliary dilatation. Pancreas: Unremarkable. No pancreatic ductal dilatation or surrounding inflammatory changes. Spleen: Normal in size without focal abnormality. Adrenals/Urinary Tract: Adrenal glands are unremarkable. Punctate nonobstructing calculus in the left kidney measuring 3 mm. No hydroureteronephrosis. The urinary bladder is unremarkable the degree of distention. Stomach/Bowel: Transmural thickening of the rectosigmoid consistent with proctocolitis. Obstruction or free air. The stomach and small intestine unremarkable. Normal appearing appendix. Vascular/Lymphatic: No significant vascular findings are present. No enlarged abdominal or pelvic lymph nodes. Reproductive: Uterus and bilateral adnexa are unremarkable. Other: No abdominal wall hernia or abnormality. No abdominopelvic ascites. Musculoskeletal: No acute or significant osseous findings. IMPRESSION: 1. Transmural thickening of the rectosigmoid consistent with proctocolitis. No complicating features. 2. 3 mm calculus in the interpolar left kidney without obstruction. Electronically Signed   By: Tollie Eth M.D.   On: 07/05/2018 22:05    Procedures Procedures (including critical care time)  Medications Ordered in ED Medications  sodium chloride 0.9 % bolus 1,000 mL (0 mLs Intravenous Stopped 07/05/18 2014)  morphine 4 MG/ML injection 4 mg (4 mg Intravenous Given 07/05/18 1953)  ondansetron (ZOFRAN) injection 4 mg (4 mg Intravenous Given 07/05/18 1953)  iohexol (OMNIPAQUE) 300 MG/ML solution 30 mL (30 mLs Oral Contrast Given 07/05/18 2000)  sodium chloride 0.9 % bolus 1,000 mL (0 mLs Intravenous Stopped 07/05/18 2219)  potassium chloride 10 mEq  in 100 mL IVPB (0 mEq Intravenous Stopped 07/05/18 2219)  iohexol (OMNIPAQUE) 300 MG/ML solution 75 mL (75 mLs Intravenous Contrast Given 07/05/18 2150)  sodium chloride (PF) 0.9 % injection (  Given by Other 07/05/18 2226)  ciprofloxacin (CIPRO) tablet 500 mg (500 mg Oral Given 07/05/18 2312)  metroNIDAZOLE (FLAGYL) tablet 500 mg (500 mg Oral Given 07/05/18 2312)  dicyclomine (BENTYL) capsule 10 mg (10 mg Oral Given 07/05/18 2312)     Initial Impression / Assessment and Plan / ED Course  I have reviewed the triage vital signs and the nursing notes.  Pertinent labs & imaging results that were available during my care of the patient were reviewed by me and considered in my medical decision making (see chart for details).        28 year old female who presents for evaluation of multiple complaints.  Patient reports that for the last week and a half, she has had intermittent abdominal pain, nausea/vomiting, diarrhea as well as fever.  Patient also reports some shortness of breath.  Denies any chest pain currently.  Patient reports a history of abdominal issues and states that she had been following up with GI but states she has not seen them in about 6 years.  Patient states that her last colonoscopy was in 2012.  She is currently on birth control pills.  Initial ED arrival, she is afebrile, tachycardic.  She is also hypertensive and tachypneic.  Patient with tenderness palpation noted to the right lower quadrant of the abdomen.  Lungs clear to auscultation.  We will plan to check labs, chest x-ray and reassess.  Given that patient is on OCPs and is tachycardic, she cannot be PERC negative.  She does not have any chest pain but does report some shortness of breath.  Will plan for d-dimer.  Question infectious process versus ulcerative colitis/Crohn's versus IBD.  Lipase normal.  CBC without any significant leukocytosis or anemia.  CMP shows hypokalemia at 2.7.  Bicarb is 21.  Otherwise unremarkable.   I-STAT beta negative.  Fecal occult shows no evidence of blood.  UA negative for any infectious etiology. Dimer is negative.  Lactic acid is slightly elevated at 2.1.  Suspect this is most likely due to dehydration from diarrhea.  Potassium repleted.  Additional fluids given. CXR shows no evidence of infectious etiology.   Vitals improved after a liter of fluids.  Tachycardia is improved significantly.  Repeat lactic improved.  CT abdomen pelvis reviewed.  There is a nonobstructing stone in the left kidney measuring 3 mm.  Normal appendix.  Normal Adnexa bilaterally.  There is transmural thickening of the rectosigmoid consistent with proctocolitis.  No complicating features.  Discussed results with patient.  Reports improvement in pain.  She still has occasional cramping pain but states that improved.  She has not had any episodes of vomiting or diarrhea here in ED.  Vital signs are stable.  I discussed with patient that we will plan to treat the colitis with Cipro and Flagyl.  Patient with no known allergies to either medications.  I discussed with patient that is vital for her to follow-up with GI.  Case management consulted for plan. At this time, patient exhibits no emergent life-threatening condition that require further evaluation in ED or admission. Patient had ample opportunity for questions and discussion. All patient's questions were answered with full understanding. Strict return precautions discussed. Patient expresses understanding and agreement to plan.   Portions of this note were generated with Scientist, clinical (histocompatibility and immunogenetics). Dictation  errors may occur despite best attempts at proofreading.   Final Clinical Impressions(s) / ED Diagnoses   Final diagnoses:  Hypokalemia  Colitis  Diarrhea, unspecified type    ED Discharge Orders         Ordered    ciprofloxacin (CIPRO) 500 MG tablet  Every 12 hours     07/05/18 2304    metroNIDAZOLE (FLAGYL) 500 MG tablet  2 times daily     07/05/18  2304    potassium chloride (K-DUR) 10 MEQ tablet  Daily     07/05/18 2311           Maxwell Caul, PA-C 07/05/18 2322    Maxwell Caul, PA-C 07/06/18 0034    Gwyneth Sprout, MD 07/07/18 1020

## 2018-07-05 NOTE — ED Notes (Signed)
Date and time results received: 07/05/18 2025 (use smartphrase ".now" to insert current time)  Test: Potassium Critical Value: 2.7  Name of Provider Notified: Graciella Freer   Orders Received? Or Actions Taken?: Orders Received - See Orders for details

## 2018-07-05 NOTE — ED Notes (Signed)
Date and time results received: 07/05/18 2005 (use smartphrase ".now" to insert current time)  Test: LA  Critical Value: 2.1  Name of Provider Notified: Graciella Freer   Orders Received? Or Actions Taken?: Orders Received - See Orders for details

## 2018-07-05 NOTE — ED Notes (Signed)
Patient transported to CT 

## 2018-07-05 NOTE — ED Triage Notes (Signed)
For past week had diarrhea and fevers. Pt reports has had a abd pains and rectal bleeding, feels her HR has been high. Pt reports nausea but denies vomiting.  Pt reports taking tylenol, robitussin, dayquil, nyquil and ibuprofen for fevers as well as cold showers, cold packs, and sweating it out Pt denies traveling.

## 2018-07-05 NOTE — Discharge Instructions (Signed)
Take Bentyl as directed for pain.  Take antibiotics as directed. Please take all of your antibiotics until finished.  As we discussed, it is very important for you to follow-up with GI.  I provided referral.  Additionally, follow-up with [facility] to establish a primary care doctor.  Take potassium pills as directed.  Closely monitor your symptoms.  Return to the emergency department for any worsening pain, fever, worsening diarrhea, persistent vomiting or any other worsening or concerning symptoms.

## 2018-07-06 ENCOUNTER — Telehealth: Payer: Self-pay | Admitting: *Deleted

## 2018-07-06 NOTE — Telephone Encounter (Signed)
[facility] T[facility] CM called number listed for pt and left message for return call. Will arrange follow up appt to see PCP. Pt has PCP listed Dr Caryl Never. Isidoro Donning RN CCM Case Mgmt phone [phone]

## 2018-07-10 NOTE — Telephone Encounter (Signed)
Called patient and LMOVM to return call  Ok for [facility] to Discuss results / PCP / recommendations / Schedule patient  I saw that patient went to ER. Wanted to know if she was feeling better or if she is still having symptoms.  CRM Created.

## 2018-07-16 ENCOUNTER — Telehealth: Payer: Self-pay | Admitting: *Deleted

## 2018-07-22 ENCOUNTER — Other Ambulatory Visit: Payer: Self-pay | Admitting: Family Medicine

## 2018-07-23 MED ORDER — AMPHETAMINE-DEXTROAMPHETAMINE 20 MG PO TABS: 20.0000 mg | ORAL_TABLET | Freq: Two times a day (BID) | ORAL | 0 refills | Status: DC

## 2018-07-23 MED ORDER — AMPHETAMINE-DEXTROAMPHETAMINE 20 MG PO TABS: ORAL_TABLET | ORAL | 0 refills | Status: DC

## 2018-07-23 NOTE — Telephone Encounter (Signed)
Last OV 03/21/18, No future OV  Last filled 03/26/18, # [phone] with 2 refills

## 2018-08-07 ENCOUNTER — Telehealth: Payer: Self-pay | Admitting: Family Medicine

## 2018-08-07 ENCOUNTER — Other Ambulatory Visit: Payer: Self-pay

## 2018-08-07 ENCOUNTER — Telehealth: Payer: Self-pay

## 2018-08-07 ENCOUNTER — Ambulatory Visit: Payer: Self-pay | Admitting: Family Medicine

## 2018-08-07 NOTE — Telephone Encounter (Signed)
LMVM for patient to return call to schedule an appointment.  Copied from CRM [id number]. Topic: Appointment Scheduling - Scheduling Inquiry for Clinic >> Aug 06, 2018  4:52 PM Dalphine Handing A wrote: Reason for CRM: Patient wants to schedule an appointment. Patient wants a callback. >> Aug 07, 2018  8:30 AM Conrad Mandan, RT wrote: Pt would like to schedule appointment

## 2018-08-07 NOTE — Telephone Encounter (Signed)
Called patient and she stated that she is having multiple issues. She has had a fever off and on for about a month and as high as 102. Patient states that she is also not able to eat and drink anything and she is already loosing weight and she does not know what to do because she is not hungry at all. Patient has had a lot of stomach discomfort and low back pain and she also stated that she has had some blood and small blood clots when she urinates and has been going on for over a week and a half. Patient also stated that she would like something for her acute panic episodes, she stated that she does not feel relief from the Klonopin and she stated that the Xanax seemed to help her better so she could calm down and she states that she needs something to work quickly when this happens so she can get relief if possible.  Patient has an appointment tomorrow morning at 8:30 am for Doxy visit for 30 minutes

## 2018-08-08 ENCOUNTER — Telehealth: Payer: Self-pay

## 2018-08-08 ENCOUNTER — Other Ambulatory Visit: Payer: Self-pay

## 2018-08-08 ENCOUNTER — Ambulatory Visit: Payer: Self-pay | Admitting: Family Medicine

## 2018-08-08 ENCOUNTER — Encounter: Payer: Self-pay | Admitting: Family Medicine

## 2018-08-08 NOTE — Progress Notes (Signed)
This patient was scheduled yesterday for Doxy visit and she did not respond to our invitation x2.  The nurse then was able to get her by phone and patient was rescheduled for this morning.  We sent her invitation by Doxy x2 and she did not respond.  I called twice and got no answer.  Her nurse also called and got no answer.

## 2018-08-08 NOTE — Telephone Encounter (Signed)
I called patient this morning and left a voice message to let her know that Dr. Caryl Never was running late and he sent her the Doxy text message two times and she never joined. Hopefully patient is okay, we have not her back from her today to reschedule. Dr. Caryl Never verbalized that this can not happen again as he has had a hard time connecting with patient and this has happened too many times. Will try to contact patient at another time.

## 2018-08-11 ENCOUNTER — Other Ambulatory Visit: Payer: Self-pay | Admitting: Family Medicine

## 2018-08-13 ENCOUNTER — Telehealth: Payer: Self-pay

## 2018-08-13 NOTE — Telephone Encounter (Signed)
Patient called and stated that she is not able to receive text messages and she has been having telephone trouble. Patient is going to call us back and see if she can set up a Doxy visit on her mothers telephone. Patient will call us back to set up a Doxy visit.

## 2018-09-07 ENCOUNTER — Ambulatory Visit: Payer: Self-pay

## 2018-09-07 NOTE — Telephone Encounter (Signed)
Patient called and says she's been having a fever for the past 3 days. She says it was in the 100's, but today it's 101. She says she's been having a sore throat, cough, body feels heavy and tired, no energy, right ear feels stopped up today and sinus congestion all for the past 3 days. She's been taking Tylenol, Allegra-D and using Flonase. I advised a virtual visit tomorrow, she agreed. Appointment scheduled for tomorrow at 1020 with Dr. Beverely Low, care advice given, patient verbalized understanding.  Reason for Disposition . Fever present > 3 days (72 hours)  Answer Assessment - Initial Assessment Questions 1. TEMPERATURE: "What is the most recent temperature?"  "How was it measured?"      101 2. ONSET: "When did the fever start?"      Past 3 days in the 100's 3. SYMPTOMS: "Do you have any other symptoms besides the fever?"  (e.g., colds, headache, sore throat, earache, cough, rash, diarrhea, vomiting, abdominal pain)     Sinus congestion, sore throat, no energy, body aches, cough, right ear stopped up 4. CAUSE: If there are no symptoms, ask: "What do you think is causing the fever?"      No idea 5. CONTACTS: "Does anyone else in the family have an infection?"     No 6. TREATMENT: "What have you done so far to treat this fever?" (e.g., medications)     Tylenol, allergy medication-allegra D, Flonase 7. IMMUNOCOMPROMISE: "Do you have of the following: diabetes, HIV positive, splenectomy, cancer chemotherapy, chronic steroid treatment, transplant patient, etc."     No 8. PREGNANCY: "Is there any chance you are pregnant?" "When was your last menstrual period?"     No, on birth control 9. TRAVEL: "Have you traveled out of the country in the last month?" (e.g., travel history, exposures)     No  Protocols used: FEVER-A-AH

## 2018-09-08 ENCOUNTER — Telehealth: Payer: Self-pay

## 2018-09-08 ENCOUNTER — Ambulatory Visit (INDEPENDENT_AMBULATORY_CARE_PROVIDER_SITE_OTHER): Payer: HRSA Program | Admitting: Family Medicine

## 2018-09-08 ENCOUNTER — Encounter (INDEPENDENT_AMBULATORY_CARE_PROVIDER_SITE_OTHER): Payer: Self-pay

## 2018-09-08 ENCOUNTER — Encounter: Payer: Self-pay | Admitting: Family Medicine

## 2018-09-08 ENCOUNTER — Other Ambulatory Visit: Payer: Self-pay

## 2018-09-08 VITALS — Temp 99.3°F

## 2018-09-08 DIAGNOSIS — R0981 Nasal congestion: Secondary | ICD-10-CM | POA: Diagnosis not present

## 2018-09-08 DIAGNOSIS — R5081 Fever presenting with conditions classified elsewhere: Secondary | ICD-10-CM | POA: Diagnosis not present

## 2018-09-08 DIAGNOSIS — R05 Cough: Secondary | ICD-10-CM | POA: Diagnosis not present

## 2018-09-08 DIAGNOSIS — Z20822 Contact with and (suspected) exposure to covid-19: Secondary | ICD-10-CM

## 2018-09-08 DIAGNOSIS — Z20828 Contact with and (suspected) exposure to other viral communicable diseases: Secondary | ICD-10-CM | POA: Diagnosis not present

## 2018-09-08 DIAGNOSIS — R5383 Other fatigue: Secondary | ICD-10-CM

## 2018-09-08 NOTE — Telephone Encounter (Signed)
Received phone order from Rennie Plowman NP  to schedule pt for testing for Covid. Clled pt x2 and unable to leave message mailbox not set up.

## 2018-09-08 NOTE — Telephone Encounter (Addendum)
Per MyChart questionnaire pt c/o cough and weakness and diarrhea that are worse today. Attempted to call pt x 2 and unable to leave message because mailbox is not set up. States has been SOB and pressure and sharp pain behind left rib/breast bone area since 0300. Pain comes ad goes and is "manageable" SOB at rest and when she goes to sleep wakes up gasping for air. Pt stated that past 4 days she has worsened. Pt has had 3 diarrhea "watery/mucousy." Pt has been drinking coconut water, Pedialyte, regular water. Last voided "5 minutes ago" clear urine.  Yesterday felt tickle and could not cough, but today no tickle to the back of the throat but having coughing episodes.   Call transferred to Rennie Plowman NP

## 2018-09-08 NOTE — Progress Notes (Signed)
Virtual Visit via Video   I connected with patient on 09/08/18 at 10:20 AM EDT by a video enabled telemedicine application and verified that I am speaking with the correct person using two identifiers.  Location patient: Home Location provider: [facility], Office Persons participating in the virtual visit: Patient, Provider, CMA (Jess B)  I discussed the limitations of evaluation and management by telemedicine and the availability of in person appointments. The patient expressed understanding and agreed to proceed.  Subjective:   HPI:   URI- sxs started 3-4 days ago.  Developed fever, sore throat, LAD, nasal congestion.  Cough worsened yesterday.  When sleeping, 'I feel like i'm forgetting to breathe so I wake up gasping'.  During the day feels like 'it's a struggle to breathe'.  Temperature 'steadily in the 101 area'.  Excessive fatigue, body feels heavy.  + HA- different from migraines.  No facial pain.  No changes to taste or smell.    ROS:   See pertinent positives and negatives per HPI.  Patient Active Problem List   Diagnosis Date Noted  . Substance induced mood disorder (HCC) 02/14/2018  . Depression, recurrent (HCC) 01/30/2018  . Migraine headache without aura 07/29/2016  . Transient alteration of awareness 07/21/2015  . Generalized anxiety disorder 07/21/2015  . Nonspecific colitis 05/18/2011  . ABDOMINAL PAIN, GENERALIZED 04/22/2010  . Panic disorder without agoraphobia 12/24/2009  . ASTHMA 12/24/2009  . INSOMNIA, TRANSIENT 12/24/2009  . Attention deficit disorder 04/12/2006    Social History   Tobacco Use  . Smoking status: Current Every Day Smoker    Packs/day: 0.50    Years: 7.00    Pack years: 3.50    Types: Cigarettes  . Smokeless tobacco: Never Used  Substance Use Topics  . Alcohol use: Yes    Frequency: Never    Comment: Episodic use    Current Outpatient Medications:  .  acetaminophen (TYLENOL) 500 MG tablet, Take 500 mg by mouth every  6 (six) hours as needed for moderate pain., Disp: , Rfl:  .  albuterol (PROVENTIL HFA;VENTOLIN HFA) 108 (90 Base) MCG/ACT inhaler, Inhale 2 puffs into the lungs every 6 (six) hours as needed for wheezing or shortness of breath., Disp: , Rfl:  .  amphetamine-dextroamphetamine (ADDERALL) 20 MG tablet, Take one tablet twice daily.  May refill in two months., Disp: 60 tablet, Rfl: 0 .  amphetamine-dextroamphetamine (ADDERALL) 20 MG tablet, Take one tablet twice daily.  May refill in one month., Disp: 60 tablet, Rfl: 0 .  amphetamine-dextroamphetamine (ADDERALL) 20 MG tablet, Take 1 tablet (20 mg total) by mouth 2 (two) times daily., Disp: 60 tablet, Rfl: 0 .  clonazePAM (KLONOPIN) 0.5 MG tablet, Take 1 tablet (0.5 mg total) by mouth 2 (two) times daily as needed for anxiety. (Patient taking differently: Take 0.5 mg by mouth 2 (two) times daily. ), Disp: 60 tablet, Rfl: 5 .  dicyclomine (BENTYL) 20 MG tablet, Take 1 tablet (20 mg total) by mouth 2 (two) times daily., Disp: 20 tablet, Rfl: 0 .  FLUoxetine (PROZAC) 40 MG capsule, Take 1 capsule (40 mg total) by mouth daily., Disp: 90 capsule, Rfl: 1 .  JUNEL 1.5/30 1.5-30 MG-MCG tablet, TAKE ONE TABLET BY MOUTH DAILY *NEED TO SCHEDULE PHYSICAL EXAM, Disp: 21 tablet, Rfl: 0 .  potassium chloride (K-DUR) 10 MEQ tablet, Take 1 tablet (10 mEq total) by mouth daily., Disp: 4 tablet, Rfl: 0 .  Pseudoeph-Doxylamine-DM-APAP (NYQUIL PO), Take 30 mLs by mouth daily as needed (cold symptoms)., Disp: ,  Rfl:  .  SUMAtriptan (IMITREX) 100 MG tablet, TAKE 1 TABLET EVERY 2 HOURS AS NEEDED FOR MIGRAINE MAY REPEAT IN 2 HOURS IF HEADACHE PERSISTS (Patient taking differently: Take 100 mg by mouth every 2 (two) hours as needed for migraine. TAKE 1 TABLET EVERY 2 HOURS AS NEEDED FOR MIGRAINE MAY REPEAT IN 2 HOURS IF HEADACHE PERSISTS), Disp: 10 tablet, Rfl: 5  Allergies  Allergen Reactions  . Prednisone Anaphylaxis  . Buspirone     "made her feel weird"  . Carafate  [Sucralfate] Hives  . Meloxicam Other (See Comments)    GI upset    Objective:   Temp 99.3 F (37.4 C) (Oral)  AAOx3, NAD but obviously not feeling well NCAT, EOMI No obvious CN deficits Pale Pt is able to speak clearly, coherently.  Some shortness of breath but no increased work of breathing. Hacking cough  Thought process is linear.  Mood is appropriate.   Assessment and Plan:   Suspected COVID- pt is obviously not feeling well.  She seems SOB at rest, hacking cough, pale.  No known contacts but has been out to run errands.  Is worried about exposing friends/family.  Discussed testing.  No available [facility] testing- discussed waiting until Monday [facility] this weekend.  Reviewed at length supportive care- fluids, rest, tylenol- and red flags- worsening shortness of breath, dizziness, confusion, dehydration, etc- that should prompt immediate evaluation.  Pt expressed understanding and is in agreement w/ plan.    Neena RhymesKatherine Danalee Flath, MD 09/08/2018

## 2018-09-08 NOTE — Addendum Note (Signed)
Addended by: Garrison Columbus on: 09/08/2018 06:55 PM   Modules accepted: Orders

## 2018-09-08 NOTE — Telephone Encounter (Signed)
Appt made for 09/10/18 at 0800 at [facility].

## 2018-09-08 NOTE — Telephone Encounter (Signed)
Covid test ordered. Unable to reach pt to schedule.

## 2018-09-08 NOTE — Progress Notes (Signed)
I have discussed the procedure for the virtual visit with the patient who has given consent to proceed with assessment and treatment.   Pt C/O Sore throat, Headache, Fatigue, Body aches, chills x 4 days, Denies any travel, Does not work right now, only goes out for errands.   Geannie Risen, CMA

## 2018-09-09 ENCOUNTER — Encounter (INDEPENDENT_AMBULATORY_CARE_PROVIDER_SITE_OTHER): Payer: Self-pay

## 2018-09-10 ENCOUNTER — Other Ambulatory Visit: Payer: Self-pay

## 2018-09-10 ENCOUNTER — Encounter (INDEPENDENT_AMBULATORY_CARE_PROVIDER_SITE_OTHER): Payer: Self-pay

## 2018-09-10 NOTE — Telephone Encounter (Signed)
Spoke with patient on 09/08/18.  Patient complained of cough, coughing spells x 4 days. Waxing and waning today.  She has been taking DayQuil, Nyquil. She has used her rescue inhaler once, however limits use due to h/o of heart racing on medication. Feels like she has congestion that needs to come out.   No wheezing, fever, chest pain.   8 episodes of brown liquid stools in past 24 hours.  H/o IBS per patient. Not bloody. No recent antibiotics. Feels diarrhea is similar to 'IBD flares' in the past. No h/o diverticulitis.   She doesn't feel she needs to be seen in ED for evaluation of diarrhea or cough.   Current smoker.   H/o asthma   Advised to use her albuterol every 8-10hours for cough, sob, however cautiously particularly with attention to any feeling of racing heart. Advised that inhaler , along with plenty of hydration will likely help break up congestion. Advised to limit use of cough syrup during day as cough is protective and helps keep airways clear. She may use honey, vicks vapor rub, limit use of Dayquil. She may use Nyquil at bedtime so she can get rest. Advised smoking cessation. Encouraged small frequent sips of water, pop sickles. She remain at home under quarantine.   She will go to ED for any worsening or new symptoms.  If urine becomes dark, amber in color or weakness worsens she will also go to ED.   She would like to have have COVID test via [facility] as doesn't feel comfortable with [facility] self swab.

## 2018-09-11 ENCOUNTER — Telehealth: Payer: Self-pay

## 2018-09-11 ENCOUNTER — Encounter (INDEPENDENT_AMBULATORY_CARE_PROVIDER_SITE_OTHER): Payer: Self-pay

## 2018-09-11 NOTE — Telephone Encounter (Signed)
Sent message to pt mychart acct , no able reach patient by phone, pt had filled out my chart question for covid advised pt toIf appetite becomes worse: encourage patient to drink fluids as tolerated, work their way up to bland solid food such as crackers, pretzels, soup, bread or applesauce and boiled starches.   If patient is unable to tolerate any foods or liquids, notify PCP.   IF PATIENT DEVELOPS SEVERE VOMITING (MORE THAN 6 TIMES A DAY AND OR >8 HOURS) AND/OR SEVERE ABDOMINAL PAIN ADVISE PATIENT TO CALL 911 AND SEEK TREATMENT IN ED

## 2018-09-12 ENCOUNTER — Telehealth: Payer: Self-pay

## 2018-09-12 ENCOUNTER — Other Ambulatory Visit: Payer: Self-pay | Admitting: Family Medicine

## 2018-09-12 LAB — NOVEL CORONAVIRUS, NAA: SARS-CoV-2, NAA: NOT DETECTED

## 2018-09-12 NOTE — Telephone Encounter (Signed)
Called patient and left a detailed voice message about the message from Dr. Caryl Never: This patient really needs further evaluation for ongoing fevers. She had recent negative coronavirus screen- but there are many other potential sources of fever- including some non-infectious sources. We need to further evaluate. Since her screen was negative we could offer office follow up. She will need further lab screening. We had recommended follow up previously but she didn't not respond to calls.   OK for [facility] to discuss/advise/schedule patient for in office visit.  CRM Created.

## 2018-09-26 ENCOUNTER — Other Ambulatory Visit: Payer: Self-pay | Admitting: Family Medicine

## 2018-09-26 NOTE — Telephone Encounter (Signed)
Last OV 03/21/18, No future OV  Last filled 03/21/18, # 60 with 5 refills

## 2018-10-06 ENCOUNTER — Other Ambulatory Visit: Payer: Self-pay | Admitting: Family Medicine

## 2018-10-23 ENCOUNTER — Encounter: Payer: Self-pay | Admitting: Family Medicine

## 2018-11-02 ENCOUNTER — Encounter: Payer: Self-pay | Admitting: Family Medicine

## 2018-11-02 ENCOUNTER — Other Ambulatory Visit: Payer: Self-pay | Admitting: Family Medicine

## 2018-11-03 ENCOUNTER — Other Ambulatory Visit: Payer: Self-pay | Admitting: Family Medicine

## 2018-11-05 MED ORDER — AMPHETAMINE-DEXTROAMPHETAMINE 20 MG PO TABS: 20.0000 mg | ORAL_TABLET | Freq: Two times a day (BID) | ORAL | 0 refills | Status: DC

## 2018-11-05 MED ORDER — AMPHETAMINE-DEXTROAMPHETAMINE 20 MG PO TABS: ORAL_TABLET | ORAL | 0 refills | Status: DC

## 2018-11-05 NOTE — Telephone Encounter (Signed)
Last OV 03/21/18, No future OV  Last filled 07/23/18, #60 with 2 refills

## 2018-11-12 ENCOUNTER — Telehealth: Payer: Self-pay | Admitting: Family Medicine

## 2018-11-14 ENCOUNTER — Telehealth (INDEPENDENT_AMBULATORY_CARE_PROVIDER_SITE_OTHER): Payer: Self-pay | Admitting: Family Medicine

## 2018-11-14 ENCOUNTER — Encounter: Payer: Self-pay | Admitting: Family Medicine

## 2018-11-14 ENCOUNTER — Other Ambulatory Visit: Payer: Self-pay

## 2018-11-14 ENCOUNTER — Telehealth: Payer: Self-pay | Admitting: Family Medicine

## 2018-11-14 DIAGNOSIS — N946 Dysmenorrhea, unspecified: Secondary | ICD-10-CM

## 2018-11-14 MED ORDER — NAPROXEN 500 MG PO TABS: 500.0000 mg | ORAL_TABLET | Freq: Two times a day (BID) | ORAL | 0 refills | Status: DC | PRN

## 2018-11-14 NOTE — Progress Notes (Signed)
This visit type was conducted due to national recommendations for restrictions regarding the COVID-19 pandemic in an effort to limit this patient's exposure and mitigate transmission in our community.   Virtual Visit via Video Note  I connected with Leslie [name] on 11/14/18 at  5:45 PM EDT by a video enabled telemedicine application and verified that I am speaking with the correct person using two identifiers.  Location patient: home Location provider:work or home office Persons participating in the virtual visit: patient, provider  I discussed the limitations of evaluation and management by telemedicine and the availability of in person appointments. The patient expressed understanding and agreed to proceed.   HPI: Patient is seen with concerns for perimenstrual cramping and dysmenorrhea.  She has had similar severe pain in the past.  Her mother has diagnosis of endometriosis.  Leslie [name] has not been diagnosed with this.  She has somewhat infrequent menses.  She has been underweight for the past several years which may be contributing.  She is on birth control and has taken this 100% of the time without skipping any doses.  She started having some spotting today and yesterday noticed some severe intermittent cramping in the pelvic area.  She has had some sharp pains.  No fever.  No vaginal discharge.  Tried Excedrin over-the-counter without much improvement.  No nausea or vomiting.  She does have history of IBS but the symptoms are much different.  No dysuria   ROS: See pertinent positives and negatives per HPI.  Past Medical History:  Diagnosis Date  . ADD 04/12/2006  . Anxiety   . ASTHMA 12/24/2009  . Depression   . IBS (irritable bowel syndrome)    NEGATIVE COLONSCOPY AND ENDOSCOPY  . INSOMNIA, TRANSIENT 12/24/2009  . PANIC ATTACK 12/24/2009  . Smoker     Past Surgical History:  Procedure Laterality Date  . COLONOSCOPY    . UPPER GASTROINTESTINAL ENDOSCOPY  2009    Family  History  Problem Relation Age of Onset  . Alcohol abuse Paternal Uncle   . Mental retardation Maternal Grandmother   . Hyperlipidemia Maternal Grandfather   . Diabetes Paternal Grandfather   . Hypertension Paternal Grandfather   . Heart disease Paternal Grandfather     SOCIAL HX: Current smoker   Current Outpatient Medications:  .  acetaminophen (TYLENOL) 500 MG tablet, Take 500 mg by mouth every 6 (six) hours as needed for moderate pain., Disp: , Rfl:  .  albuterol (PROVENTIL HFA;VENTOLIN HFA) 108 (90 Base) MCG/ACT inhaler, Inhale 2 puffs into the lungs every 6 (six) hours as needed for wheezing or shortness of breath., Disp: , Rfl:  .  amphetamine-dextroamphetamine (ADDERALL) 20 MG tablet, Take one tablet twice daily.  May refill in two months., Disp: 60 tablet, Rfl: 0 .  amphetamine-dextroamphetamine (ADDERALL) 20 MG tablet, Take one tablet twice daily.  May refill in one month., Disp: 60 tablet, Rfl: 0 .  amphetamine-dextroamphetamine (ADDERALL) 20 MG tablet, Take 1 tablet (20 mg total) by mouth 2 (two) times daily., Disp: 60 tablet, Rfl: 0 .  clonazePAM (KLONOPIN) 0.5 MG tablet, Take 1 tablet (0.5 mg total) by mouth 2 (two) times daily., Disp: 60 tablet, Rfl: 5 .  dicyclomine (BENTYL) 20 MG tablet, Take 1 tablet (20 mg total) by mouth 2 (two) times daily., Disp: 20 tablet, Rfl: 0 .  FLUoxetine (PROZAC) 40 MG capsule, Take 1 capsule (40 mg total) by mouth daily., Disp: 90 capsule, Rfl: 1 .  JUNEL 1.5/30 1.5-30 MG-MCG tablet, TAKE ONE TABLET  BY MOUTH DAILY * NEEDS TO SCHEDULE PHYSICAL EXAM, Disp: 21 tablet, Rfl: 0 .  naproxen (NAPROSYN) 500 MG tablet, Take 1 tablet (500 mg total) by mouth 2 (two) times daily as needed., Disp: 30 tablet, Rfl: 0 .  potassium chloride (K-DUR) 10 MEQ tablet, Take 1 tablet (10 mEq total) by mouth daily., Disp: 4 tablet, Rfl: 0 .  Pseudoeph-Doxylamine-DM-APAP (NYQUIL PO), Take 30 mLs by mouth daily as needed (cold symptoms)., Disp: , Rfl:  .  SUMAtriptan  (IMITREX) 100 MG tablet, TAKE 1 TABLET EVERY 2 HOURS AS NEEDED FOR MIGRAINE MAY REPEAT IN 2 HOURS IF HEADACHE PERSISTS (Patient taking differently: Take 100 mg by mouth every 2 (two) hours as needed for migraine. TAKE 1 TABLET EVERY 2 HOURS AS NEEDED FOR MIGRAINE MAY REPEAT IN 2 HOURS IF HEADACHE PERSISTS), Disp: 10 tablet, Rfl: 5  EXAM:  VITALS per patient if applicable:  GENERAL: alert, oriented, appears well and in no acute distress  HEENT: atraumatic, conjunttiva clear, no obvious abnormalities on inspection of external nose and ears  NECK: normal movements of the head and neck  LUNGS: on inspection no signs of respiratory distress, breathing rate appears normal, no obvious gross SOB, gasping or wheezing  CV: no obvious cyanosis  MS: moves all visible extremities without noticeable abnormality  PSYCH/NEURO: pleasant and cooperative, no obvious depression or anxiety, speech and thought processing grossly intact  ASSESSMENT AND PLAN:  Discussed the following assessment and plan:  Perimenstrual cramping/primary dysmenorrhea  -Recommend trial of naproxen 500 mg twice daily with food -Touch base for any persistent or worsening symptoms -We will need pelvic exam and further evaluation if not improving with the above     I discussed the assessment and treatment plan with the patient. The patient was provided an opportunity to ask questions and all were answered. The patient agreed with the plan and demonstrated an understanding of the instructions.   The patient was advised to call back or seek an in-person evaluation if the symptoms worsen or if the condition fails to improve as anticipated.    Evelena PeatBruce Arnisha Laffoon, MD

## 2018-11-14 NOTE — Telephone Encounter (Signed)
See request °

## 2018-11-14 NOTE — Telephone Encounter (Signed)
I have set patient up for a Doxy today at 5:45pm.

## 2018-11-14 NOTE — Telephone Encounter (Signed)
Pt called in to ask if provider could prescribe her something for her pain? Pt says that she had really bad pain when she get her cycle. Pt says that she doesn't get her cycle on a regular but when she does it hurt so bad. Pt says if an apt is needed, please call her at:    CB: [phone]    Pharmacy:  [facility] [address] [place] - [place] [phone] (Phone) [phone] (Fax)

## 2018-11-23 ENCOUNTER — Other Ambulatory Visit: Payer: Self-pay | Admitting: Family Medicine

## 2018-11-30 ENCOUNTER — Encounter: Payer: Self-pay | Admitting: Family Medicine

## 2018-12-05 ENCOUNTER — Other Ambulatory Visit: Payer: Self-pay

## 2018-12-05 ENCOUNTER — Ambulatory Visit (INDEPENDENT_AMBULATORY_CARE_PROVIDER_SITE_OTHER): Payer: Self-pay | Admitting: Family Medicine

## 2018-12-05 VITALS — BP 98/66 | Temp 98.2°F | Resp 16 | Ht 65.0 in | Wt 99.5 lb

## 2018-12-05 DIAGNOSIS — G43009 Migraine without aura, not intractable, without status migrainosus: Secondary | ICD-10-CM

## 2018-12-05 DIAGNOSIS — N946 Dysmenorrhea, unspecified: Secondary | ICD-10-CM

## 2018-12-05 MED ORDER — HYDROCODONE-ACETAMINOPHEN 5-325 MG PO TABS: 1.0000 | ORAL_TABLET | Freq: Four times a day (QID) | ORAL | 0 refills | Status: DC | PRN

## 2018-12-05 NOTE — Progress Notes (Signed)
  Subjective:     Patient ID: Leslie [name], female   DOB: Dec 17, 1990, 28 y.o.   MRN: [id number]  HPI Patient called to discuss couple of acute pain issues that she deals with time to time.  She has painful menstrual cramps but sometimes do not respond to nonsteroidals.  We recently called in some naproxen.  She also has migraine headaches and usually when she takes Imitrex promptly her headaches respond to that.  She has occasional resistant migraines that do not respond. She has tried basic things like Tylenol without improvement with her menstrual cramps or migraines.  She does take oral contraception.  Her periods have been fairly regular.  No recent fevers or chills.  No abnormal vaginal bleeding.  Past Medical History:  Diagnosis Date  . ADD 04/12/2006  . Anxiety   . ASTHMA 12/24/2009  . Depression   . IBS (irritable bowel syndrome)    NEGATIVE COLONSCOPY AND ENDOSCOPY  . INSOMNIA, TRANSIENT 12/24/2009  . PANIC ATTACK 12/24/2009  . Smoker    Past Surgical History:  Procedure Laterality Date  . COLONOSCOPY    . UPPER GASTROINTESTINAL ENDOSCOPY  2009    reports that she has been smoking cigarettes. She has a 3.50 pack-year smoking history. She has never used smokeless tobacco. She reports current alcohol use. She reports previous drug use. family history includes Alcohol abuse in her paternal uncle; Diabetes in her paternal grandfather; Heart disease in her paternal grandfather; Hyperlipidemia in her maternal grandfather; Hypertension in her paternal grandfather; Mental retardation in her maternal grandmother. Allergies  Allergen Reactions  . Prednisone Anaphylaxis  . Buspirone     "made her feel weird"  . Carafate [Sucralfate] Hives  . Meloxicam Other (See Comments)    GI upset     Review of Systems  Constitutional: Negative for appetite change and unexpected weight change.  Eyes: Negative for visual disturbance.  Respiratory: Negative for cough and shortness of  breath.   Cardiovascular: Negative for chest pain.  Gastrointestinal: Negative for abdominal pain.  Genitourinary: Negative for dysuria, menstrual problem, vaginal bleeding and vaginal discharge.  Neurological: Positive for headaches.       Objective:   Physical Exam Constitutional:      Appearance: Normal appearance.  Cardiovascular:     Rate and Rhythm: Normal rate and regular rhythm.  Neurological:     General: No focal deficit present.     Mental Status: She is alert.        Assessment:     #1 severe perimenstrual cramps  #2 intermittent migraine headache without aura    Plan:     -We discussed recommendation for first-line therapy with nonsteroidals for her menstrual cramping pains.  We agreed to very limited Vicodin 5 mg 1-2 every 6 hours for severe pain not relieved with naproxen -We also discussed with her migraines importance of early treatment with triptan with Imitrex and also try combining naproxen with that.  We reiterated that we do not recommend opioids frequently for persistent headache  Eulas Post MD [facility]

## 2018-12-13 ENCOUNTER — Encounter: Payer: Self-pay | Admitting: Family Medicine

## 2019-01-03 ENCOUNTER — Other Ambulatory Visit: Payer: Self-pay | Admitting: Family Medicine

## 2019-01-08 ENCOUNTER — Ambulatory Visit (INDEPENDENT_AMBULATORY_CARE_PROVIDER_SITE_OTHER): Payer: Self-pay | Admitting: Family Medicine

## 2019-01-08 ENCOUNTER — Encounter: Payer: Self-pay | Admitting: Family Medicine

## 2019-01-08 ENCOUNTER — Other Ambulatory Visit: Payer: Self-pay

## 2019-01-08 VITALS — BP 110/58 | HR 112 | Temp 98.5°F | Wt 98.1 lb

## 2019-01-08 DIAGNOSIS — G5622 Lesion of ulnar nerve, left upper limb: Secondary | ICD-10-CM

## 2019-01-08 NOTE — Patient Instructions (Signed)
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  Follow up for any weakness or progressive numbness.

## 2019-01-08 NOTE — Progress Notes (Signed)
Subjective:     Patient ID: Leslie [name], female   DOB: May 10, 1990, 28 y.o.   MRN: [id number]  HPI Patient relates new symptom of some intermittent tingling involving mostly the left fifth digit and fourth digit.  She is right-hand dominant.  Symptoms started couple days ago.  Denies any elbow pain.  Occasional mild left-sided neck pain.  She said occasional radiation of tingling symptoms toward the wrist on the ulnar side.  She has not noted any pain at the elbow region.  No definite weakness.  She describes 1 day having slightly purplish discoloration of 1 of her fingertips but has not noted any since then.  No history of Raynaud's syndrome.  She does smoke 1/2 pack cigarettes per day.  No history of ulnar neuropathy.  She is not aware of resting her elbow frequently on any counter surface.  Denies any cough or dyspnea.  No chest pain.  Past Medical History:  Diagnosis Date  . ADD 04/12/2006  . Anxiety   . ASTHMA 12/24/2009  . Depression   . IBS (irritable bowel syndrome)    NEGATIVE COLONSCOPY AND ENDOSCOPY  . INSOMNIA, TRANSIENT 12/24/2009  . PANIC ATTACK 12/24/2009  . Smoker    Past Surgical History:  Procedure Laterality Date  . COLONOSCOPY    . UPPER GASTROINTESTINAL ENDOSCOPY  2009    reports that she has been smoking cigarettes. She has a 3.50 pack-year smoking history. She has never used smokeless tobacco. She reports current alcohol use. She reports previous drug use. family history includes Alcohol abuse in her paternal uncle; Diabetes in her paternal grandfather; Heart disease in her paternal grandfather; Hyperlipidemia in her maternal grandfather; Hypertension in her paternal grandfather; Mental retardation in her maternal grandmother. Allergies  Allergen Reactions  . Prednisone Anaphylaxis  . Buspirone     "made her feel weird"  . Carafate [Sucralfate] Hives  . Meloxicam Other (See Comments)    GI upset     Review of Systems  Constitutional: Negative for  appetite change, chills, fever and unexpected weight change.  Respiratory: Negative for cough and shortness of breath.   Cardiovascular: Negative for chest pain, palpitations and leg swelling.  Neurological: Negative for weakness and headaches.  Hematological: Negative for adenopathy. Does not bruise/bleed easily.       Objective:   Physical Exam Constitutional:      Appearance: Normal appearance.  Cardiovascular:     Rate and Rhythm: Normal rate and regular rhythm.     Heart sounds: No murmur.     Comments: Both hands are warm to touch with excellent capillary refill throughout.  She has palpable radial and ulnar pulses bilaterally. Pulmonary:     Effort: Pulmonary effort is normal.     Breath sounds: Normal breath sounds.  Musculoskeletal:     Comments: No visible swelling upper extremities.  She has full range of motion all digits of the hand as well as wrist and elbow.  Neurological:     Mental Status: She is alert.     Comments: Full strength upper extremities.  Mild subjective sensory impairment left fifth digit and ulnar half of the fourth digit.  No interosseous muscle wasting.  Upper extremity reflexes are symmetric        Assessment:     Left ulnar neuropathy/neuritis symptoms.  She is not having any elbow pain and denies any consistent applied pressure to the elbow region.  No evidence for weakness.    Plan:     -Recommend keeping  pressure off the left elbow and observe for now since this just started 2 days ago.  If she has any evidence for weakness or progressive numbness or persistent symptoms consider referral for further evaluation -Strongly encouraged to stop smoking  Kristian Covey MD [facility] at [facility]

## 2019-01-09 ENCOUNTER — Other Ambulatory Visit: Payer: Self-pay | Admitting: Family Medicine

## 2019-01-09 NOTE — Telephone Encounter (Signed)
Routing to PCP for approval.

## 2019-01-09 NOTE — Telephone Encounter (Signed)
Please advise 

## 2019-01-09 NOTE — Telephone Encounter (Signed)
Decline.  We wrote for limited pain medication only to take for severe peri-menstrual pain not relieved with Naproxen.  Should rarely be taking- as discussed.

## 2019-01-11 ENCOUNTER — Encounter: Payer: Self-pay | Admitting: Family Medicine

## 2019-01-11 ENCOUNTER — Other Ambulatory Visit: Payer: Self-pay | Admitting: Family Medicine

## 2019-01-14 NOTE — Telephone Encounter (Signed)
Last filled 12/05/2018. Last OV 01/08/2019  Ok to refill?

## 2019-01-15 NOTE — Telephone Encounter (Signed)
Denial sent to the pharmacy. 

## 2019-01-15 NOTE — Telephone Encounter (Signed)
This has already been denied

## 2019-01-15 NOTE — Telephone Encounter (Signed)
Last Rx given on 8/26 for #20 with no refills

## 2019-01-17 ENCOUNTER — Ambulatory Visit: Payer: Self-pay | Admitting: *Deleted

## 2019-01-17 NOTE — Telephone Encounter (Signed)
Summary: congestion, headache, sore throat   Patient requesting call back from NT to discuss congestion, headache, sore throat, and temp of 99.8. Patient states she has known exposure to covid-19.       Returned call to patient regarding her symptoms. And exposure to covid-19. 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 She developed symptoms on Saturday. She stated that she had all the symptoms except loss of taste and smell. Her temp has been up to 101. Advised her to treat the symptoms and if at any time she she has respiratory distress to call 911. She voiced understanding. She is advised to get tested and to call back for increase in severity of symptoms. Routing to LB at [facility] for review and recommendations.  Reason for Disposition . [1] COVID-19 infection suspected by caller or triager AND [2] mild symptoms (cough, fever, or others) AND [5] no complications or SOB  Answer Assessment - Initial Assessment Questions 1. COVID-19 DIAGNOSIS: "Who made your Coronavirus (COVID-19) diagnosis?" "Was it confirmed by a positive lab test?" If not diagnosed by a HCP, ask "Are there lots of cases (community spread) where you live?" (See public health department website, if unsure)    In the community 2. ONSET: "When did the COVID-19 symptoms start?"     saturday 3. WORST SYMPTOM: "What is your worst symptom?" (e.g., cough, fever, shortness of breath, muscle aches)     Pressure behind eyes and nose 4. COUGH: "Do you have a cough?" If so, ask: "How bad is the cough?"       Dry cough at night 5. FEVER: "Do you have a fever?" If so, ask: "What is your temperature, how was it measured, and when did it start?"     Temp has been up to 101. Now it is 99 6. RESPIRATORY STATUS: "Describe your breathing?" (e.g., shortness of breath, wheezing, unable to speak)      Chest heaviness 7. BETTER-SAME-WORSE: "Are you getting better, staying the same or getting worse  compared to yesterday?"  If getting worse, ask, "In what way?"     Depends on the time of the day and but feels like it is worst 8. HIGH RISK DISEASE: "Do you have any chronic medical problems?" (e.g., asthma, heart or lung disease, weak immune system, etc.)     asthma 9. PREGNANCY: "Is there any chance you are pregnant?" "When was your last menstrual period?"     Not pregnant, LMP started Oct 1st or 2nd. 10. OTHER SYMPTOMS: "Do you have any other symptoms?"  (e.g., chills, fatigue, headache, loss of smell or taste, muscle pain, sore throat)      Diarrhea, muscle pain, fatigue, sore throat, headache, chills  Protocols used: CORONAVIRUS (COVID-19) DIAGNOSED OR SUSPECTED-A-AH

## 2019-01-22 NOTE — Telephone Encounter (Signed)
Agree with advice given that she should go get tested for Covid and stay quarantined in the meantime

## 2019-01-22 NOTE — Telephone Encounter (Signed)
Dr. Elease Hashimoto, this is just a FYI  I contacted pt and she has not been tested. She is still having symptoms but has chose to treat with otc medication and remain in self isolation. She as of right now wants to hold off on getting tested as she really doesn't want to leave the house. She states will consider getting tested.

## 2019-01-28 ENCOUNTER — Other Ambulatory Visit: Payer: Self-pay | Admitting: Family Medicine

## 2019-01-30 ENCOUNTER — Telehealth (INDEPENDENT_AMBULATORY_CARE_PROVIDER_SITE_OTHER): Payer: Self-pay | Admitting: Family Medicine

## 2019-01-30 ENCOUNTER — Other Ambulatory Visit: Payer: Self-pay

## 2019-01-30 ENCOUNTER — Telehealth: Payer: Self-pay | Admitting: Family Medicine

## 2019-01-30 ENCOUNTER — Ambulatory Visit: Payer: Self-pay | Admitting: Family Medicine

## 2019-01-30 ENCOUNTER — Encounter: Payer: Self-pay | Admitting: Family Medicine

## 2019-01-30 DIAGNOSIS — L03115 Cellulitis of right lower limb: Secondary | ICD-10-CM

## 2019-01-30 DIAGNOSIS — R4586 Emotional lability: Secondary | ICD-10-CM

## 2019-01-30 MED ORDER — DOXYCYCLINE HYCLATE 100 MG PO CAPS: 100.0000 mg | ORAL_CAPSULE | Freq: Two times a day (BID) | ORAL | 0 refills | Status: DC

## 2019-01-30 NOTE — Progress Notes (Signed)
This visit type was conducted due to national recommendations for restrictions regarding the COVID-19 pandemic in an effort to limit this patient's exposure and mitigate transmission in our community.   Virtual Visit via Video Note  I connected with Leslie [name] on 01/30/19 at  5:45 PM EDT by a video enabled telemedicine application and verified that I am speaking with the correct person using two identifiers.  Location patient: home Location provider:work or home office Persons participating in the virtual visit: patient, provider  I discussed the limitations of evaluation and management by telemedicine and the availability of in person appointments. The patient expressed understanding and agreed to proceed.   HPI: Leslie [name] called today with the following concerns  She has new issue of slightly painful reddish area on her right calf.  First noted around early Sunday.  She is not aware of any injury.  Started as a white "bump".and then started having some progressive redness.  She states she has had some low-grade fever.  No known history of MRSA.  Slightly painful. She sent in pictures for review and this definitely looks like a cellulitis with somewhat scabbed center that she states started as a white dot.  Her other concern is that she has had some intermittent depression and possible mania for several years.  She has seen various psychiatrist in the past but currently has no insurance.  She was seen at one point by [facility] and she was prescribed some type of mood stabilizer but cannot recall which.  She has been diagnosed as "bipolar" in past.  She thinks it may have been Depakote.  She does recall having intolerance with a least one mood stabilizer.  She was recently on Prozac and did have some improvement depression symptoms but currently not taking that.  She has been on Klonopin for several years.  We discussed multiple times trying to taper that off.  No delusions.   She states her mania  and impulsive behaviors have been things like getting tattoos and piercings.   No current suicidal ideation.   ROS: See pertinent positives and negatives per HPI.  Past Medical History:  Diagnosis Date  . ADD 04/12/2006  . Anxiety   . ASTHMA 12/24/2009  . Depression   . IBS (irritable bowel syndrome)    NEGATIVE COLONSCOPY AND ENDOSCOPY  . INSOMNIA, TRANSIENT 12/24/2009  . PANIC ATTACK 12/24/2009  . Smoker     Past Surgical History:  Procedure Laterality Date  . COLONOSCOPY    . UPPER GASTROINTESTINAL ENDOSCOPY  2009    Family History  Problem Relation Age of Onset  . Alcohol abuse Paternal Uncle   . Mental retardation Maternal Grandmother   . Hyperlipidemia Maternal Grandfather   . Diabetes Paternal Grandfather   . Hypertension Paternal Grandfather   . Heart disease Paternal Grandfather     SOCIAL HX: Smoker   EXAM:  VITALS per patient if applicable:  GENERAL: alert, oriented, appears well and in no acute distress  HEENT: atraumatic, conjunttiva clear, no obvious abnormalities on inspection of external nose and ears  NECK: normal movements of the head and neck  LUNGS: on inspection no signs of respiratory distress, breathing rate appears normal, no obvious gross SOB, gasping or wheezing  CV: no obvious cyanosis  MS: moves all visible extremities without noticeable abnormality  PSYCH/NEURO: pleasant and cooperative, no obvious depression or anxiety, speech and thought processing grossly intact  ASSESSMENT AND PLAN:  Discussed the following assessment and plan:  #1 probable spontaneously drained small  abscess right leg with some mild surrounding cellulitis changes.  Given the fact that had a white center to start with probable staph -Start doxycycline 100 mg twice daily with food -Follow-up immediately for any increased fever or progressive redness  #2 history of recurrent depression and mood swings with concern for bipolar.  Currently stable -Patient is  encouraged to try to find back to her old records what she has taken previously and especially what she has not tolerated. -We discussed several possible mood stabilizer including Lamictal, Depakote, Zyprexa, Seroquel.  She will check on old records first. -We had advised psychiatry follow-up but at this point she has no insurance and declines     I discussed the assessment and treatment plan with the patient. The patient was provided an opportunity to ask questions and all were answered. The patient agreed with the plan and demonstrated an understanding of the instructions.   The patient was advised to call back or seek an in-person evaluation if the symptoms worsen or if the condition fails to improve as anticipated.   Carolann Littler, MD

## 2019-02-04 ENCOUNTER — Encounter: Payer: Self-pay | Admitting: Family Medicine

## 2019-02-04 NOTE — Telephone Encounter (Signed)
Last rx given on 8/26 for #20 with no ref

## 2019-02-05 ENCOUNTER — Other Ambulatory Visit: Payer: Self-pay | Admitting: Family Medicine

## 2019-02-05 NOTE — Telephone Encounter (Signed)
Denied.  We have discussed previously that this should be taken only very infrequently for severe menstrual cramps not relieved with NSAIDS.

## 2019-02-05 NOTE — Telephone Encounter (Signed)
I called the pt and informed her of the message below. Patient stated she did not intend to have this refill request sent.  She also wanted to let Dr Elease Hashimoto know in regards to depression and mood stabilizers that she recalls using Seroquel and this did not react with her well.  Stated this caused extreme confusion and she felt like she was in a dream.  Message sent to Dr Elease Hashimoto.

## 2019-02-06 ENCOUNTER — Other Ambulatory Visit: Payer: Self-pay

## 2019-02-06 ENCOUNTER — Encounter: Payer: Self-pay | Admitting: Family Medicine

## 2019-02-06 DIAGNOSIS — Z20822 Contact with and (suspected) exposure to covid-19: Secondary | ICD-10-CM

## 2019-02-07 ENCOUNTER — Telehealth: Payer: Self-pay

## 2019-02-07 LAB — NOVEL CORONAVIRUS, NAA: SARS-CoV-2, NAA: NOT DETECTED

## 2019-02-07 MED ORDER — OLANZAPINE 5 MG PO TABS: 5.0000 mg | ORAL_TABLET | Freq: Every day | ORAL | 1 refills | Status: DC

## 2019-02-07 NOTE — Telephone Encounter (Signed)
Copied from [place] [id number]. Topic: Quick Communication - See Telephone Encounter >> Feb 07, 2019  2:42 PM Leslie [name] wrote: CRM for notification. See Telephone encounter for: 02/07/19. Pt is wanting to make sure her meds are not going to react against each other, she is confused as she was going to get at a point something for her moods and did not. She wanting to make sure dr is fully aware of her situation , advice... [phone]

## 2019-02-07 NOTE — Telephone Encounter (Signed)
Zyprexa is a mood stabilizer and we had discussed starting  A mood stabilizer first b/o her hx of probably Bipolar.  We could add something later like low-dose fluoxetine but would like to start the stabilizer first.  Make sure she has follow-up in about 3 to 4 weeks with me to readdress

## 2019-02-07 NOTE — Telephone Encounter (Signed)
Recent visit to discuss medication options.  She has been diagnosed with bipolar by more than 1 mental health specialist in the past.  She has had previous intolerance with Seroquel.  We discussed possible initiation of mood stabilizer.  May still need antidepressant combination.  Recommend starting Zyprexa 5 mg nightly and recommend set up 1 month follow-up.  Recommend in office evaluation if possible.  We will check CBC at that time and titrate up if necessary.  Please let patient know that I have sent in her first month prescription to pharmacy

## 2019-02-11 NOTE — Telephone Encounter (Signed)
Pt also sent a mychart message about this. Placed his response in [employer]. Closing this out

## 2019-03-01 ENCOUNTER — Other Ambulatory Visit: Payer: Self-pay | Admitting: Family Medicine

## 2019-03-03 NOTE — Telephone Encounter (Signed)
Declined.  She is not on this as regular medication.

## 2019-03-04 NOTE — Telephone Encounter (Signed)
Please refuse this medication. The system will not allow me to refuse this medication. Thank you.

## 2019-03-11 ENCOUNTER — Telehealth: Payer: Self-pay | Admitting: Family Medicine

## 2019-03-11 NOTE — Telephone Encounter (Signed)
Pt wants to have the nurse call her about her medications, what she is on is not working wants a Materials engineer as soon as possible

## 2019-03-12 ENCOUNTER — Telehealth (INDEPENDENT_AMBULATORY_CARE_PROVIDER_SITE_OTHER): Payer: Self-pay | Admitting: Family Medicine

## 2019-03-12 ENCOUNTER — Other Ambulatory Visit: Payer: Self-pay

## 2019-03-12 DIAGNOSIS — F39 Unspecified mood [affective] disorder: Secondary | ICD-10-CM

## 2019-03-12 NOTE — Progress Notes (Signed)
This visit type was conducted due to national recommendations for restrictions regarding the COVID-19 pandemic in an effort to limit this patient's exposure and mitigate transmission in our community.   Virtual Visit via Video Note  I connected with Leslie [name] on 03/12/19 at  3:30 PM EST by a video enabled telemedicine application and verified that I am speaking with the correct person using two identifiers.  Location patient: home Location provider:work or home office Persons participating in the virtual visit: patient, provider  I discussed the limitations of evaluation and management by telemedicine and the availability of in person appointments. The patient expressed understanding and agreed to proceed.   HPI: Leslie [name] has long history of mood disorder.  She has fluctuating periods of elevated mood with depressed mood.  She has seen psychiatrist in the past and had been diagnosed previously with bipolar disorder.  She was especially concerned about some impulsivity and irritability.  We recently started Zyprexa 5 mg once daily.  She has not had any major side effects.  No increased appetite.  No increased somnolence.  She feels this has made some mild improvement but she still has increased impulsive and irritability behaviors.  She would like to consider further titration.  She has not had any major depressive symptoms since we had spoken last.  She has been on Prozac in the past.  She had previous intolerance with several other mood stabilizers.  She has been for several years on Klonopin.  We had talked about trying to get her tapered off that.  She is currently down to 0.5 mg once or twice daily.  Taking mostly at night.   ROS: See pertinent positives and negatives per HPI.  Past Medical History:  Diagnosis Date  . ADD 04/12/2006  . Anxiety   . ASTHMA 12/24/2009  . Depression   . IBS (irritable bowel syndrome)    NEGATIVE COLONSCOPY AND ENDOSCOPY  . INSOMNIA, TRANSIENT 12/24/2009   . PANIC ATTACK 12/24/2009  . Smoker     Past Surgical History:  Procedure Laterality Date  . COLONOSCOPY    . UPPER GASTROINTESTINAL ENDOSCOPY  2009    Family History  Problem Relation Age of Onset  . Alcohol abuse Paternal Uncle   . Mental retardation Maternal Grandmother   . Hyperlipidemia Maternal Grandfather   . Diabetes Paternal Grandfather   . Hypertension Paternal Grandfather   . Heart disease Paternal Grandfather     SOCIAL HX: Current smoker    EXAM:  VITALS per patient if applicable:  GENERAL: alert, oriented, appears well and in no acute distress  HEENT: atraumatic, conjunttiva clear, no obvious abnormalities on inspection of external nose and ears  NECK: normal movements of the head and neck  LUNGS: on inspection no signs of respiratory distress, breathing rate appears normal, no obvious gross SOB, gasping or wheezing  CV: no obvious cyanosis  MS: moves all visible extremities without noticeable abnormality  PSYCH/NEURO: pleasant and cooperative, no obvious depression or anxiety, speech and thought processing grossly intact  ASSESSMENT AND PLAN:  Discussed the following assessment and plan:  Mood disorder with probable bipolar.  Recent initiation of Zyprexa as above and tolerating well but still has some impulsivity and irritability  -Increase Zyprexa to 10 mg daily. -Reassess in 2 weeks -We discussed discontinuation of Klonopin.  We did agree once we stop her Klonopin to have low-dose alprazolam on hand only to take as needed for severe anxiety symptoms but not regularly. -Discussed possible initiation of antidepressant in conjunction  with her Zyprexa at follow-up     I discussed the assessment and treatment plan with the patient. The patient was provided an opportunity to ask questions and all were answered. The patient agreed with the plan and demonstrated an understanding of the instructions.   The patient was advised to call back or seek an  in-person evaluation if the symptoms worsen or if the condition fails to improve as anticipated.     [name], MD

## 2019-03-22 ENCOUNTER — Encounter: Payer: Self-pay | Admitting: Family Medicine

## 2019-03-22 ENCOUNTER — Other Ambulatory Visit: Payer: Self-pay | Admitting: Family Medicine

## 2019-03-23 MED ORDER — AMPHETAMINE-DEXTROAMPHETAMINE 20 MG PO TABS: ORAL_TABLET | ORAL | 0 refills | Status: DC

## 2019-03-23 MED ORDER — AMPHETAMINE-DEXTROAMPHETAMINE 20 MG PO TABS: 20.0000 mg | ORAL_TABLET | Freq: Two times a day (BID) | ORAL | 0 refills | Status: DC

## 2019-03-25 MED ORDER — OLANZAPINE 10 MG PO TABS: 10.0000 mg | ORAL_TABLET | Freq: Every day | ORAL | 5 refills | Status: DC

## 2019-03-25 NOTE — Telephone Encounter (Signed)
Let her know I have sent in new rx for Zyprexa 10 mg daily.

## 2019-03-29 ENCOUNTER — Other Ambulatory Visit: Payer: Self-pay

## 2019-03-29 ENCOUNTER — Telehealth (INDEPENDENT_AMBULATORY_CARE_PROVIDER_SITE_OTHER): Payer: Self-pay | Admitting: Family Medicine

## 2019-03-29 DIAGNOSIS — F39 Unspecified mood [affective] disorder: Secondary | ICD-10-CM

## 2019-03-29 NOTE — Progress Notes (Signed)
This visit type was conducted due to national recommendations for restrictions regarding the COVID-19 pandemic in an effort to limit this patient's exposure and mitigate transmission in our community.   Virtual Visit via Video Note  I connected with [name] on 03/29/19 at  2:45 PM EST by a video enabled telemedicine application and verified that I am speaking with the correct person using two identifiers.  Location patient: home Location provider:work or home office Persons participating in the virtual visit: patient, provider  I discussed the limitations of evaluation and management by telemedicine and the availability of in person appointments. The patient expressed understanding and agreed to proceed.   HPI: Leslie [name] has longstanding history of recurrent depression and also intermittent agitation and concern for bipolar disorder.  She is currently on Zyprexa with recent titration to 10 mg and she feels this is helping to stabilize her mood.  She still has some low-grade depression at times but is currently reluctant to start antidepressant.  She has taken Prozac in the past.  She denies any significant weight gain thus far.  No agitation.  Overall feels improved.  No suicidal ideation.  She has ADD treated with Adderall.  She feels that is stable.  She has noticed some increased appetite past few days.  Longstanding history of anxiety.  We have strongly advocated tapering off Klonopin and she is currently down to just 0.5 mg nightly.  We would like to see her get completely off that.   ROS: See pertinent positives and negatives per HPI.  Past Medical History:  Diagnosis Date  . ADD 04/12/2006  . Anxiety   . ASTHMA 12/24/2009  . Depression   . IBS (irritable bowel syndrome)    NEGATIVE COLONSCOPY AND ENDOSCOPY  . INSOMNIA, TRANSIENT 12/24/2009  . PANIC ATTACK 12/24/2009  . Smoker     Past Surgical History:  Procedure Laterality Date  . COLONOSCOPY    . UPPER GASTROINTESTINAL  ENDOSCOPY  2009    Family History  Problem Relation Age of Onset  . Alcohol abuse Paternal Uncle   . Mental retardation Maternal Grandmother   . Hyperlipidemia Maternal Grandfather   . Diabetes Paternal Grandfather   . Hypertension Paternal Grandfather   . Heart disease Paternal Grandfather     SOCIAL HX: smoker.   EXAM:  VITALS per patient if applicable:  GENERAL: alert, oriented, appears well and in no acute distress  HEENT: atraumatic, conjunttiva clear, no obvious abnormalities on inspection of external nose and ears  NECK: normal movements of the head and neck  LUNGS: on inspection no signs of respiratory distress, breathing rate appears normal, no obvious gross SOB, gasping or wheezing  CV: no obvious cyanosis  MS: moves all visible extremities without noticeable abnormality  PSYCH/NEURO: pleasant and cooperative, no obvious depression or anxiety, speech and thought processing grossly intact  ASSESSMENT AND PLAN:  Discussed the following assessment and plan:  History of mood disorder with probable bipolar.  Improved on Zyprexa  -Continue Zyprexa 10 mg daily.  We have many times encouraged her to follow-up with psychiatry but she is reluctant at this point. -May need to add low-dose antidepressant such as 10 mg fluoxetine for any recurrent depression symptoms -Continue tapering off Klonopin as previously discussed       I discussed the assessment and treatment plan with the patient. The patient was provided an opportunity to ask questions and all were answered. The patient agreed with the plan and demonstrated an understanding of the instructions.  The patient was advised to call back or seek an in-person evaluation if the symptoms worsen or if the condition fails to improve as anticipated.     Carolann Littler, MD

## 2019-04-04 ENCOUNTER — Other Ambulatory Visit: Payer: Self-pay | Admitting: Family Medicine

## 2019-04-13 ENCOUNTER — Encounter: Payer: Self-pay | Admitting: Family Medicine

## 2019-04-15 MED ORDER — ALPRAZOLAM 0.5 MG PO TABS: 0.5000 mg | ORAL_TABLET | Freq: Three times a day (TID) | ORAL | 0 refills | Status: DC | PRN

## 2019-04-15 MED ORDER — FLUOXETINE HCL 10 MG PO CAPS: 10.0000 mg | ORAL_CAPSULE | Freq: Every day | ORAL | 3 refills | Status: DC

## 2019-04-15 NOTE — Telephone Encounter (Signed)
Let her know I sent in low dose Prozac, as discussed and also limited Alprazolam only to take for severe anxiety and  Not regularly.  Make sure we have one month follow up.

## 2019-04-17 ENCOUNTER — Encounter: Payer: Self-pay | Admitting: Family Medicine

## 2019-04-17 MED ORDER — TRAMADOL HCL 50 MG PO TABS: 50.0000 mg | ORAL_TABLET | Freq: Four times a day (QID) | ORAL | 0 refills | Status: AC | PRN

## 2019-04-17 NOTE — Telephone Encounter (Signed)
I sent in limited tramadol to take as needed for severe menstrual cramps

## 2019-05-17 ENCOUNTER — Other Ambulatory Visit: Payer: Self-pay

## 2019-05-20 ENCOUNTER — Encounter: Payer: Self-pay | Admitting: Family Medicine

## 2019-05-20 ENCOUNTER — Other Ambulatory Visit: Payer: Self-pay

## 2019-05-20 ENCOUNTER — Ambulatory Visit (INDEPENDENT_AMBULATORY_CARE_PROVIDER_SITE_OTHER): Payer: Self-pay | Admitting: Family Medicine

## 2019-05-20 VITALS — BP 114/72 | HR 107 | Temp 98.5°F | Ht 65.0 in | Wt 113.9 lb

## 2019-05-20 DIAGNOSIS — Z79899 Other long term (current) drug therapy: Secondary | ICD-10-CM

## 2019-05-20 DIAGNOSIS — F39 Unspecified mood [affective] disorder: Secondary | ICD-10-CM

## 2019-05-20 MED ORDER — ALPRAZOLAM 0.5 MG PO TABS: 0.5000 mg | ORAL_TABLET | Freq: Three times a day (TID) | ORAL | 0 refills | Status: DC | PRN

## 2019-05-20 NOTE — Progress Notes (Signed)
Subjective:     Patient ID: Leslie [name], female   DOB: [birth year], 29 y.o.   MRN: [id number]  HPI   [name] is seen for follow-up regarding her depression.  She has seen various psychiatrists in the past but because of her current self-pay status had declined going back to psychiatry.  She has been diagnosed in the past with bipolar disorder.  We had definite concerns about bipolar as she has had history of mood swings between depression and mania type symptoms.  We started Zyprexa and she is doing very well with 10 mg.  She had tried multiple other medications prior to this and had side effects are either not good response.  We added low-dose Prozac 10 mg daily and she feels this is helping as well.  She has gained some weight since starting Zyprexa.  She tapered off Klonopin.  We did agree to very limited alprazolam use only as needed for severe anxiety symptoms.  Wt Readings from Last 3 Encounters:  05/20/19 113 lb 14.4 oz (51.7 kg)  01/08/19 98 lb 1.6 oz (44.5 kg)  12/05/18 99 lb 8 oz (45.1 kg)    Past Medical History:  Diagnosis Date  . ADD 04/12/2006  . Anxiety   . ASTHMA 12/24/2009  . Depression   . IBS (irritable bowel syndrome)    NEGATIVE COLONSCOPY AND ENDOSCOPY  . INSOMNIA, TRANSIENT 12/24/2009  . PANIC ATTACK 12/24/2009  . Smoker    Past Surgical History:  Procedure Laterality Date  . COLONOSCOPY    . UPPER GASTROINTESTINAL ENDOSCOPY  2009    reports that she has been smoking cigarettes. She has a 3.50 pack-year smoking history. She has never used smokeless tobacco. She reports current alcohol use. She reports previous drug use. family history includes Alcohol abuse in her paternal uncle; Diabetes in her paternal grandfather; Heart disease in her paternal grandfather; Hyperlipidemia in her maternal grandfather; Hypertension in her paternal grandfather; Mental retardation in her maternal grandmother. Allergies  Allergen Reactions  . Prednisone Anaphylaxis  .  Buspirone     "made her feel weird"  . Carafate [Sucralfate] Hives  . Meloxicam Other (See Comments)    GI upset      Review of Systems  Constitutional: Positive for appetite change. Negative for chills and fever.  Respiratory: Negative for shortness of breath.   Cardiovascular: Negative for chest pain.  Psychiatric/Behavioral: Negative for agitation, dysphoric mood, sleep disturbance and suicidal ideas.       Objective:   Physical Exam Vitals reviewed.  Constitutional:      Appearance: Normal appearance.  Cardiovascular:     Rate and Rhythm: Normal rate and regular rhythm.     Heart sounds: No murmur.  Pulmonary:     Effort: Pulmonary effort is normal.     Breath sounds: Normal breath sounds.  Neurological:     Mental Status: She is alert.  Psychiatric:        Mood and Affect: Mood normal.        Behavior: Behavior normal.        Thought Content: Thought content normal.        Judgment: Judgment normal.        Assessment:     History of bipolar disorder currently stable on combination of Zyprexa and Prozac.  She has had weight gain which is not unexpected on Zyprexa.  She is happy with the weight gain thus far and has a goal weight of 115 pounds    Plan:     -  Continue medication regimen as above -We again reiterated importance of avoiding regular use of benzodiazepines.  We gave her 1 refill of alprazolam 0.5 mg to use only for severe anxiety symptoms -Check CBC and hepatic panel with recent initiation of Zyprexa -We will plan routine follow-up in 6 months and sooner as needed  Eulas Post MD [facility]

## 2019-06-11 ENCOUNTER — Other Ambulatory Visit: Payer: Self-pay | Admitting: Family Medicine

## 2019-06-24 ENCOUNTER — Other Ambulatory Visit: Payer: Self-pay | Admitting: Family Medicine

## 2019-06-24 NOTE — Telephone Encounter (Signed)
Last ov:05/20/19 Last filled:05/20/19

## 2019-06-26 ENCOUNTER — Other Ambulatory Visit: Payer: Self-pay | Admitting: Family Medicine

## 2019-06-26 NOTE — Telephone Encounter (Signed)
Last ov:05/20/19 Last filled:04/17/19

## 2019-07-01 ENCOUNTER — Other Ambulatory Visit: Payer: Self-pay | Admitting: Family Medicine

## 2019-07-01 ENCOUNTER — Telehealth: Payer: Self-pay | Admitting: Family Medicine

## 2019-07-01 NOTE — Telephone Encounter (Signed)
Pt has been scheduled for a virtual visit  

## 2019-07-01 NOTE — Telephone Encounter (Signed)
Pt would like to speak to you regarding possibly restarting her anxiety and depression medication. Pt is very emotionless and is looking to restart medication until December when she is done with school. Thanks

## 2019-07-02 ENCOUNTER — Other Ambulatory Visit: Payer: Self-pay

## 2019-07-02 ENCOUNTER — Telehealth (INDEPENDENT_AMBULATORY_CARE_PROVIDER_SITE_OTHER): Payer: Self-pay | Admitting: Family Medicine

## 2019-07-02 DIAGNOSIS — F39 Unspecified mood [affective] disorder: Secondary | ICD-10-CM

## 2019-07-02 MED ORDER — CLONAZEPAM 1 MG PO TABS: 1.0000 mg | ORAL_TABLET | Freq: Every day | ORAL | 3 refills | Status: DC | PRN

## 2019-07-02 NOTE — Progress Notes (Signed)
This visit type was conducted due to national recommendations for restrictions regarding the COVID-19 pandemic in an effort to limit this patient's exposure and mitigate transmission in our community.   Virtual Visit via Video Note  I connected with Leslie [name] on 07/02/19 at  2:45 PM EDT by a video enabled telemedicine application and verified that I am speaking with the correct person using two identifiers.  Location patient: home Location provider:work or home office Persons participating in the virtual visit: patient, provider  I discussed the limitations of evaluation and management by telemedicine and the availability of in person appointments. The patient expressed understanding and agreed to proceed.   HPI:  Leslie [name] called to discuss her anxiety and depression medications.  She is currently on mood stabilizer with Zyprexa.  She feels that her mood is very stable overall but she has had some weight gain.  She is up to 120 pounds.  She is happy with current weight gain but does not wish to continue with increasing weight gain.  She took fluoxetine for depression symptoms and feels like she had no emotions at all and stopped this on her own several weeks ago.  She has not noted increased depression symptoms since stopping this.    She is calling to discuss anxiety medication.  She had been on Klonopin for years and doing well on low-dose.  We had transitioned her off that and to as needed alprazolam but she was using that more regularly than we felt comfortable and she is now calling to see if she can go back on low-dose Klonopin instead.  We discussed concerns for long-term use with possible memory loss   ROS: See pertinent positives and negatives per HPI.  Past Medical History:  Diagnosis Date  . ADD 04/12/2006  . Anxiety   . ASTHMA 12/24/2009  . Depression   . IBS (irritable bowel syndrome)    NEGATIVE COLONSCOPY AND ENDOSCOPY  . INSOMNIA, TRANSIENT 12/24/2009  . PANIC ATTACK  12/24/2009  . Smoker     Past Surgical History:  Procedure Laterality Date  . COLONOSCOPY    . UPPER GASTROINTESTINAL ENDOSCOPY  2009    Family History  Problem Relation Age of Onset  . Alcohol abuse Paternal Uncle   . Mental retardation Maternal Grandmother   . Hyperlipidemia Maternal Grandfather   . Diabetes Paternal Grandfather   . Hypertension Paternal Grandfather   . Heart disease Paternal Grandfather     SOCIAL HX: nicotine use.   Current Outpatient Medications:  .  acetaminophen (TYLENOL) 500 MG tablet, Take 500 mg by mouth every 6 (six) hours as needed for moderate pain., Disp: , Rfl:  .  albuterol (PROVENTIL HFA;VENTOLIN HFA) 108 (90 Base) MCG/ACT inhaler, Inhale 2 puffs into the lungs every 6 (six) hours as needed for wheezing or shortness of breath., Disp: , Rfl:  .  amphetamine-dextroamphetamine (ADDERALL) 20 MG tablet, Take one tablet twice daily.  May refill in one month., Disp: 60 tablet, Rfl: 0 .  amphetamine-dextroamphetamine (ADDERALL) 20 MG tablet, Take one tablet twice daily.  May refill in two months., Disp: 60 tablet, Rfl: 0 .  amphetamine-dextroamphetamine (ADDERALL) 20 MG tablet, Take 1 tablet (20 mg total) by mouth 2 (two) times daily., Disp: 60 tablet, Rfl: 0 .  clonazePAM (KLONOPIN) 1 MG tablet, Take 1 tablet (1 mg total) by mouth daily as needed for anxiety., Disp: 30 tablet, Rfl: 3 .  naproxen (NAPROSYN) 500 MG tablet, Take 1 tablet (500 mg total) by mouth 2 (two)  times daily as needed., Disp: 30 tablet, Rfl: 0 .  Norethindrone Acetate-Ethinyl Estradiol (HAILEY 1.5/30) 1.5-30 MG-MCG tablet, Take 1 tablet by mouth daily., Disp: 21 tablet, Rfl: 0 .  OLANZapine (ZYPREXA) 10 MG tablet, Take 1 tablet (10 mg total) by mouth at bedtime., Disp: 30 tablet, Rfl: 5 .  potassium chloride (K-DUR) 10 MEQ tablet, Take 1 tablet (10 mEq total) by mouth daily., Disp: 4 tablet, Rfl: 0 .  SUMAtriptan (IMITREX) 100 MG tablet, TAKE ONE TABLET BY MOUTH AT ONSET OF MIGRAINE; MAY  REPEAT ONE TABLET IN 2 HOURS IF NEEDED IF HEADACHE PERSISTS, Disp: 9 tablet, Rfl: 4  EXAM:  VITALS per patient if applicable:  GENERAL: alert, oriented, appears well and in no acute distress  HEENT: atraumatic, conjunttiva clear, no obvious abnormalities on inspection of external nose and ears  NECK: normal movements of the head and neck  LUNGS: on inspection no signs of respiratory distress, breathing rate appears normal, no obvious gross SOB, gasping or wheezing  CV: no obvious cyanosis  MS: moves all visible extremities without noticeable abnormality  PSYCH/NEURO: pleasant and cooperative, no obvious depression or anxiety, speech and thought processing grossly intact  ASSESSMENT AND PLAN:  Discussed the following assessment and plan:  History of chronic depression and anxiety.  -Continue olanzapine 10 mg once daily -We had placed previous order for hepatic and CBC and she will set that up -Discontinue alprazolam -Agreed to starting back low-dose Klonopin 1 mg 1/2 to 1 tablet once daily as needed for severe anxiety symptoms-#30 with 3 refills     I discussed the assessment and treatment plan with the patient. The patient was provided an opportunity to ask questions and all were answered. The patient agreed with the plan and demonstrated an understanding of the instructions.   The patient was advised to call back or seek an in-person evaluation if the symptoms worsen or if the condition fails to improve as anticipated.    [name], MD

## 2019-07-03 ENCOUNTER — Other Ambulatory Visit (INDEPENDENT_AMBULATORY_CARE_PROVIDER_SITE_OTHER): Payer: Self-pay

## 2019-07-03 DIAGNOSIS — Z79899 Other long term (current) drug therapy: Secondary | ICD-10-CM

## 2019-07-03 LAB — CBC WITH DIFFERENTIAL/PLATELET
Basophils Absolute: 0.1 10*3/uL (ref 0.0–0.1)
Basophils Relative: 0.9 % (ref 0.0–3.0)
Eosinophils Absolute: 0.2 10*3/uL (ref 0.0–0.7)
Eosinophils Relative: 2 % (ref 0.0–5.0)
HCT: 40.8 % (ref 36.0–46.0)
Hemoglobin: 14 g/dL (ref 12.0–15.0)
Lymphocytes Relative: 29.5 % (ref 12.0–46.0)
Lymphs Abs: 3.1 10*3/uL (ref 0.7–4.0)
MCHC: 34.2 g/dL (ref 30.0–36.0)
MCV: 94.6 fl (ref 78.0–100.0)
Monocytes Absolute: 0.7 10*3/uL (ref 0.1–1.0)
Monocytes Relative: 6.9 % (ref 3.0–12.0)
Neutro Abs: 6.3 10*3/uL (ref 1.4–7.7)
Neutrophils Relative %: 60.7 % (ref 43.0–77.0)
Platelets: 291 10*3/uL (ref 150.0–400.0)
RBC: 4.32 Mil/uL (ref 3.87–5.11)
RDW: 12.9 % (ref 11.5–15.5)
WBC: 10.4 10*3/uL (ref 4.0–10.5)

## 2019-07-03 LAB — HEPATIC FUNCTION PANEL
ALT: 17 U/L (ref 0–35)
AST: 18 U/L (ref 0–37)
Albumin: 4.9 g/dL (ref 3.5–5.2)
Alkaline Phosphatase: 56 U/L (ref 39–117)
Bilirubin, Direct: 0.1 mg/dL (ref 0.0–0.3)
Total Bilirubin: 0.3 mg/dL (ref 0.2–1.2)
Total Protein: 7.5 g/dL (ref 6.0–8.3)

## 2019-07-03 NOTE — Addendum Note (Signed)
Addended by: Philemon Kingdom on: 07/03/2019 08:06 AM   Modules accepted: Orders

## 2019-07-16 ENCOUNTER — Encounter: Payer: Self-pay | Admitting: Family Medicine

## 2019-07-16 ENCOUNTER — Other Ambulatory Visit: Payer: Self-pay | Admitting: Family Medicine

## 2019-07-16 MED ORDER — AMPHETAMINE-DEXTROAMPHETAMINE 20 MG PO TABS: ORAL_TABLET | ORAL | 0 refills | Status: DC

## 2019-07-16 MED ORDER — AMPHETAMINE-DEXTROAMPHETAMINE 20 MG PO TABS: 20.0000 mg | ORAL_TABLET | Freq: Two times a day (BID) | ORAL | 0 refills | Status: DC

## 2019-07-16 NOTE — Telephone Encounter (Signed)
This should not have been denied.   I just refilled her medication.

## 2019-08-08 ENCOUNTER — Other Ambulatory Visit: Payer: Self-pay | Admitting: Family Medicine

## 2019-08-28 ENCOUNTER — Encounter: Payer: Self-pay | Admitting: Family Medicine

## 2019-08-28 MED ORDER — TRAMADOL HCL 50 MG PO TABS: 50.0000 mg | ORAL_TABLET | Freq: Four times a day (QID) | ORAL | 0 refills | Status: DC | PRN

## 2019-08-28 NOTE — Telephone Encounter (Signed)
Send in refill of a few tramadol

## 2019-09-17 ENCOUNTER — Other Ambulatory Visit: Payer: Self-pay | Admitting: Family Medicine

## 2019-10-10 ENCOUNTER — Other Ambulatory Visit: Payer: Self-pay | Admitting: Family Medicine

## 2019-10-31 ENCOUNTER — Other Ambulatory Visit: Payer: Self-pay | Admitting: Family Medicine

## 2019-11-01 NOTE — Telephone Encounter (Signed)
Rx last filled 10/03/19

## 2019-11-04 ENCOUNTER — Other Ambulatory Visit: Payer: Self-pay | Admitting: Family Medicine

## 2019-11-04 MED ORDER — AMPHETAMINE-DEXTROAMPHETAMINE 20 MG PO TABS: 20.0000 mg | ORAL_TABLET | Freq: Two times a day (BID) | ORAL | 0 refills | Status: DC

## 2019-11-04 MED ORDER — AMPHETAMINE-DEXTROAMPHETAMINE 20 MG PO TABS: ORAL_TABLET | ORAL | 0 refills | Status: DC

## 2019-11-04 NOTE — Telephone Encounter (Signed)
Patient has an appointment 11/06/2019

## 2019-11-04 NOTE — Telephone Encounter (Signed)
Please advise 

## 2019-11-06 ENCOUNTER — Encounter: Payer: Self-pay | Admitting: Family Medicine

## 2019-11-06 ENCOUNTER — Other Ambulatory Visit: Payer: Self-pay

## 2019-11-06 ENCOUNTER — Ambulatory Visit (INDEPENDENT_AMBULATORY_CARE_PROVIDER_SITE_OTHER): Payer: Self-pay | Admitting: Family Medicine

## 2019-11-06 VITALS — BP 110/62 | HR 110 | Temp 98.4°F | Wt 113.8 lb

## 2019-11-06 DIAGNOSIS — F988 Other specified behavioral and emotional disorders with onset usually occurring in childhood and adolescence: Secondary | ICD-10-CM

## 2019-11-06 NOTE — Progress Notes (Signed)
Established Patient Office Visit  Subjective:  Patient ID: Leslie [name], female    DOB: 1990-06-01  Age: 29 y.o. MRN: [id number]  CC:  Chief Complaint  Patient presents with  . medication check    pt here for med check on adderall    HPI Gerry Heaphy presents for medication follow-up She has history of ADD and takes Adderall for that. Her appetite and weight have been stable. She had past history of chronic anxiety and recurrent depression and concern for bipolar disorder. She is seen psychiatrist in the past. She was more recently on Zyprexa but had weight gain of to 127 pounds and felt that with medication she felt too flat. She took her self off and states that she feels better at this time. She denies any current depression symptoms. No recent agitation. No increased pulse 70.  She needs to get follow-up Pap smear and is requesting female provider. She has taken oral contraceptives in the past.  Past Medical History:  Diagnosis Date  . ADD 04/12/2006  . Anxiety   . ASTHMA 12/24/2009  . Depression   . IBS (irritable bowel syndrome)    NEGATIVE COLONSCOPY AND ENDOSCOPY  . INSOMNIA, TRANSIENT 12/24/2009  . PANIC ATTACK 12/24/2009  . Smoker     Past Surgical History:  Procedure Laterality Date  . COLONOSCOPY    . UPPER GASTROINTESTINAL ENDOSCOPY  2009    Family History  Problem Relation Age of Onset  . Alcohol abuse Paternal Uncle   . Mental retardation Maternal Grandmother   . Hyperlipidemia Maternal Grandfather   . Diabetes Paternal Grandfather   . Hypertension Paternal Grandfather   . Heart disease Paternal Grandfather     Social History   Socioeconomic History  . Marital status: Single    Spouse name: Not on file  . Number of children: 0  . Years of education: Not on file  . Highest education level: Not on file  Occupational History  . Occupation: Research scientist (medical)    Comment: Health visitor  Tobacco Use  . Smoking status: Current Every Day Smoker     Packs/day: 0.50    Years: 7.00    Pack years: 3.50    Types: Cigarettes  . Smokeless tobacco: Never Used  Vaping Use  . Vaping Use: Never used  Substance and Sexual Activity  . Alcohol use: Yes    Comment: Episodic use  . Drug use: Not Currently  . Sexual activity: Not Currently    Birth control/protection: Pill  Other Topics Concern  . Not on file  Social History Narrative   ** Merged History Encounter **       Pt is a 29 year old female who lives with mother and father.  Employed at [employer] and also goes to school full-time at [employer]  Currently on leave of absence.   Social Determinants of Health   Financial Resource Strain:   . Difficulty of Paying Living Expenses:   Food Insecurity:   . Worried About Programme researcher, broadcasting/film/video in the Last Year:   . Barista in the Last Year:   Transportation Needs:   . Freight forwarder (Medical):   Marland Kitchen Lack of Transportation (Non-Medical):   Physical Activity:   . Days of Exercise per Week:   . Minutes of Exercise per Session:   Stress:   . Feeling of Stress :   Social Connections:   . Frequency of Communication with Friends and Family:   .  Frequency of Social Gatherings with Friends and Family:   . Attends Religious Services:   . Active Member of Clubs or Organizations:   . Attends Banker Meetings:   Marland Kitchen Marital Status:   Intimate Partner Violence:   . Fear of Current or Ex-Partner:   . Emotionally Abused:   Marland Kitchen Physically Abused:   . Sexually Abused:     Outpatient Medications Prior to Visit  Medication Sig Dispense Refill  . acetaminophen (TYLENOL) 500 MG tablet Take 500 mg by mouth every 6 (six) hours as needed for moderate pain.    Marland Kitchen albuterol (PROVENTIL HFA;VENTOLIN HFA) 108 (90 Base) MCG/ACT inhaler Inhale 2 puffs into the lungs every 6 (six) hours as needed for wheezing or shortness of breath.    . amphetamine-dextroamphetamine (ADDERALL) 20 MG tablet Take one tablet twice daily.  May refill in one  month. 60 tablet 0  . amphetamine-dextroamphetamine (ADDERALL) 20 MG tablet Take one tablet twice daily.  May refill in two months. 60 tablet 0  . amphetamine-dextroamphetamine (ADDERALL) 20 MG tablet Take 1 tablet (20 mg total) by mouth 2 (two) times daily. 60 tablet 0  . clonazePAM (KLONOPIN) 0.5 MG tablet Take 0.5 mg by mouth 2 (two) times daily as needed for anxiety.    . naproxen (NAPROSYN) 500 MG tablet Take 1 tablet (500 mg total) by mouth 2 (two) times daily as needed. 30 tablet 0  . Norethindrone Acetate-Ethinyl Estradiol (HAILEY 1.5/30) 1.5-30 MG-MCG tablet Take 1 tablet by mouth daily. 21 tablet 0  . SUMAtriptan (IMITREX) 100 MG tablet TAKE ONE TABLET BY MOUTH AT ONSET OF HEADACHE; MAY REPEAT ONE TABLET IN 2 HOURS IF NEEDED. 9 tablet 2  . clonazePAM (KLONOPIN) 1 MG tablet TAKE ONE TABLET BY MOUTH DAILY AS NEEDED FOR ANXIETY 30 tablet 2  . OLANZapine (ZYPREXA) 10 MG tablet TAKE ONE TABLET BY MOUTH EVERY NIGHT AT BEDTIME 30 tablet 4  . potassium chloride (K-DUR) 10 MEQ tablet Take 1 tablet (10 mEq total) by mouth daily. 4 tablet 0   No facility-administered medications prior to visit.    Allergies  Allergen Reactions  . Prednisone Anaphylaxis  . Buspirone     "made her feel weird"  . Carafate [Sucralfate] Hives  . Meloxicam Other (See Comments)    GI upset    ROS Review of Systems  Constitutional: Negative for fatigue.  Eyes: Negative for visual disturbance.  Respiratory: Negative for cough, chest tightness, shortness of breath and wheezing.   Cardiovascular: Negative for chest pain, palpitations and leg swelling.  Neurological: Negative for dizziness, seizures, syncope, weakness, light-headedness and headaches.      Objective:    Physical Exam Vitals reviewed.  Constitutional:      Appearance: Normal appearance.  Cardiovascular:     Rate and Rhythm: Regular rhythm.     Heart sounds: No murmur heard.  No gallop.   Pulmonary:     Effort: Pulmonary effort is  normal.     Breath sounds: Normal breath sounds.  Musculoskeletal:     Cervical back: Neck supple.     Right lower leg: No edema.     Left lower leg: No edema.  Lymphadenopathy:     Cervical: No cervical adenopathy.  Neurological:     Mental Status: She is alert.     BP (!) 110/62 (BP Location: Left Arm, Patient Position: Sitting, Cuff Size: Normal)   Pulse (!) 110   Temp 98.4 F (36.9 C) (Oral)   Wt 113 lb 12.8  oz (51.6 kg)   SpO2 99%   BMI 18.94 kg/m  Wt Readings from Last 3 Encounters:  11/06/19 113 lb 12.8 oz (51.6 kg)  05/20/19 113 lb 14.4 oz (51.7 kg)  01/08/19 98 lb 1.6 oz (44.5 kg)     Health Maintenance Due  Topic Date Due  . Hepatitis C Screening  Never done  . COVID-19 Vaccine (1) Never done  . PAP SMEAR-Modifier  02/24/2018    There are no preventive care reminders to display for this patient.  Lab Results  Component Value Date   TSH 0.97 06/13/2017   Lab Results  Component Value Date   WBC 10.4 07/03/2019   HGB 14.0 07/03/2019   HCT 40.8 07/03/2019   MCV 94.6 07/03/2019   PLT 291.0 07/03/2019   Lab Results  Component Value Date   NA 138 07/05/2018   K 2.7 (LL) 07/05/2018   CO2 21 (L) 07/05/2018   GLUCOSE 88 07/05/2018   BUN 6 07/05/2018   CREATININE 0.71 07/05/2018   BILITOT 0.3 07/03/2019   ALKPHOS 56 07/03/2019   AST 18 07/03/2019   ALT 17 07/03/2019   PROT 7.5 07/03/2019   ALBUMIN 4.9 07/03/2019   CALCIUM 10.2 07/05/2018   ANIONGAP 13 07/05/2018   GFR 95.00 08/14/2017   Lab Results  Component Value Date   CHOL 174 02/18/2015   Lab Results  Component Value Date   HDL 58.50 02/18/2015   Lab Results  Component Value Date   LDLCALC 97 02/18/2015   Lab Results  Component Value Date   TRIG 90.0 02/18/2015   Lab Results  Component Value Date   CHOLHDL 3 02/18/2015   No results found for: HGBA1C    Assessment & Plan:   Attention deficit disorder stable on Adderall. Her pulse was slightly elevated today but regular.  However, she had a Monster energy drink before she came which likely accounts for this.  -Continue Adderall 20 mg twice daily. Recent refills given  She is requesting repeat Pap smear and would like to set up with female provider. We gave her names of a couple of providers here  No orders of the defined types were placed in this encounter.   Follow-up: No follow-ups on file.    [name], MD

## 2019-11-14 ENCOUNTER — Other Ambulatory Visit: Payer: Self-pay | Admitting: Family Medicine

## 2019-11-15 NOTE — Telephone Encounter (Signed)
Please advise 

## 2019-11-18 ENCOUNTER — Encounter: Payer: Self-pay | Admitting: Family Medicine

## 2019-11-18 MED ORDER — TRAMADOL HCL 50 MG PO TABS: ORAL_TABLET | ORAL | 0 refills | Status: DC

## 2019-12-03 ENCOUNTER — Other Ambulatory Visit: Payer: Self-pay | Admitting: Family Medicine

## 2019-12-06 ENCOUNTER — Other Ambulatory Visit: Payer: Self-pay

## 2019-12-06 MED ORDER — NORETHINDRONE ACET-ETHINYL EST 1.5-30 MG-MCG PO TABS: 1.0000 | ORAL_TABLET | Freq: Every day | ORAL | 11 refills | Status: AC

## 2019-12-30 ENCOUNTER — Other Ambulatory Visit: Payer: Self-pay | Admitting: Family Medicine

## 2020-01-23 ENCOUNTER — Encounter: Payer: Self-pay | Admitting: Family Medicine

## 2020-01-27 ENCOUNTER — Other Ambulatory Visit: Payer: Self-pay | Admitting: Family Medicine

## 2020-01-28 ENCOUNTER — Encounter: Payer: Self-pay | Admitting: Family Medicine

## 2020-01-28 ENCOUNTER — Telehealth (INDEPENDENT_AMBULATORY_CARE_PROVIDER_SITE_OTHER): Payer: Self-pay | Admitting: Family Medicine

## 2020-01-28 VITALS — HR 115 | Ht 65.0 in | Wt 115.0 lb

## 2020-01-28 DIAGNOSIS — G43911 Migraine, unspecified, intractable, with status migrainosus: Secondary | ICD-10-CM

## 2020-01-28 MED ORDER — CLONAZEPAM 0.5 MG PO TABS: 0.5000 mg | ORAL_TABLET | Freq: Two times a day (BID) | ORAL | 3 refills | Status: DC

## 2020-01-28 MED ORDER — ONDANSETRON 4 MG PO TBDP
4.0000 mg | ORAL_TABLET | Freq: Three times a day (TID) | ORAL | 0 refills | Status: DC | PRN
Start: 2020-01-28 — End: 2020-02-19

## 2020-01-28 MED ORDER — HYDROCODONE-ACETAMINOPHEN 5-325 MG PO TABS: ORAL_TABLET | ORAL | 0 refills | Status: DC

## 2020-01-28 NOTE — Progress Notes (Signed)
Patient ID: Leslie [name], female   DOB: 02-25-1991, 29 y.o.   MRN: [id number]  This visit type was conducted due to national recommendations for restrictions regarding the COVID-19 pandemic in an effort to limit this patient's exposure and mitigate transmission in our community.   Virtual Visit via Video Note  I connected with Leslie [name] on 01/28/20 at  4:30 PM EDT by a video enabled telemedicine application and verified that I am speaking with the correct person using two identifiers.  Location patient: home Location provider:work or home office Persons participating in the virtual visit: patient, provider  I discussed the limitations of evaluation and management by telemedicine and the availability of in person appointments. The patient expressed understanding and agreed to proceed.   HPI: Leslie [name] has history of migraine headaches.  These are usually somewhat infrequent.  She states she had onset of headache typical of previous migraines last Monday.  She has tried multiple things including her usual Imitrex, Advil, Aleve, Tylenol, and Excedrin Migraine without much relief.  She has had some nausea.  Light sensitivity.  Headaches initially right retro-orbital and now somewhat generalized bifrontal.  No fever.  No sinusitis symptoms.  She had some difficulty sleeping.  Usually gets good relief with Imitrex but this time is not worked well for her.  She has reported history of allergy to prednisone.  She is taking this once and felt like her throat was closing up.  She is also tried some caffeine with minimal relief.  No recent focal neurologic symptoms.  No recent head injury.   ROS: See pertinent positives and negatives per HPI.  Past Medical History:  Diagnosis Date  . ADD 04/12/2006  . Anxiety   . ASTHMA 12/24/2009  . Depression   . IBS (irritable bowel syndrome)    NEGATIVE COLONSCOPY AND ENDOSCOPY  . INSOMNIA, TRANSIENT 12/24/2009  . PANIC ATTACK 12/24/2009  . Smoker      Past Surgical History:  Procedure Laterality Date  . COLONOSCOPY    . UPPER GASTROINTESTINAL ENDOSCOPY  2009    Family History  Problem Relation Age of Onset  . Alcohol abuse Paternal Uncle   . Mental retardation Maternal Grandmother   . Hyperlipidemia Maternal Grandfather   . Diabetes Paternal Grandfather   . Hypertension Paternal Grandfather   . Heart disease Paternal Grandfather     SOCIAL HX: Smoker   Current Outpatient Medications:  .  acetaminophen (TYLENOL) 500 MG tablet, Take 500 mg by mouth every 6 (six) hours as needed for moderate pain., Disp: , Rfl:  .  albuterol (PROVENTIL HFA;VENTOLIN HFA) 108 (90 Base) MCG/ACT inhaler, Inhale 2 puffs into the lungs every 6 (six) hours as needed for wheezing or shortness of breath., Disp: , Rfl:  .  amphetamine-dextroamphetamine (ADDERALL) 20 MG tablet, Take one tablet twice daily.  May refill in one month., Disp: 60 tablet, Rfl: 0 .  amphetamine-dextroamphetamine (ADDERALL) 20 MG tablet, Take one tablet twice daily.  May refill in two months., Disp: 60 tablet, Rfl: 0 .  amphetamine-dextroamphetamine (ADDERALL) 20 MG tablet, Take 1 tablet (20 mg total) by mouth 2 (two) times daily., Disp: 60 tablet, Rfl: 0 .  clonazePAM (KLONOPIN) 0.5 MG tablet, Take 1 tablet (0.5 mg total) by mouth 2 (two) times daily., Disp: 60 tablet, Rfl: 3 .  naproxen (NAPROSYN) 500 MG tablet, Take 1 tablet (500 mg total) by mouth 2 (two) times daily as needed., Disp: 30 tablet, Rfl: 0 .  Norethindrone Acetate-Ethinyl Estradiol (HAILEY 1.5/30) 1.5-30 MG-MCG  tablet, Take 1 tablet by mouth daily., Disp: 21 tablet, Rfl: 11 .  SUMAtriptan (IMITREX) 100 MG tablet, TAKE ONE TABLET BY MOUTH AT ONSET OF HEADACHE; MAY REPEAT ONE TABLET IN 2 HOURS IF NEEDED., Disp: 9 tablet, Rfl: 2 .  traMADol (ULTRAM) 50 MG tablet, TAKE ONE TABLET BY MOUTH EVERY 6 HOURS AS NEEDED FOR UP TO 5 DAYS, Disp: 20 tablet, Rfl: 0 .  HYDROcodone-acetaminophen (NORCO/VICODIN) 5-325 MG tablet, Take  one to two tablets every 6 hours as needed for severe migraine., Disp: 10 tablet, Rfl: 0 .  ondansetron (ZOFRAN ODT) 4 MG disintegrating tablet, Take 1 tablet (4 mg total) by mouth every 8 (eight) hours as needed for nausea or vomiting., Disp: 15 tablet, Rfl: 0  EXAM:  VITALS per patient if applicable:  GENERAL: alert, oriented, appears well and in no acute distress  HEENT: atraumatic, conjunttiva clear, no obvious abnormalities on inspection of external nose and ears  NECK: normal movements of the head and neck  LUNGS: on inspection no signs of respiratory distress, breathing rate appears normal, no obvious gross SOB, gasping or wheezing  CV: no obvious cyanosis  MS: moves all visible extremities without noticeable abnormality  PSYCH/NEURO: pleasant and cooperative, no obvious depression or anxiety, speech and thought processing grossly intact  ASSESSMENT AND PLAN:  Discussed the following assessment and plan:  Status migraine headache.  Symptoms of longer duration than usual.  Quality of symptoms is very similar to her typical migraines.  -She has already taken multiple doses of Imitrex and we have cautioned about regular repeated uses of analgesics-including triptans -We discussed that we frequently would use steroids but she has had intolerance to multiple steroids in the past -Zofran 4 mg ODT 1 every 8 hours as needed for nausea -We explained that pain medications such as opioids are a last resort generally.  She is tried multiple over-the-counter things as above.  She cannot take prednisone.  We agreed to a very limited number of hydrocodone 5 mg 1-2 every 6 hours as needed and be in touch if headache not relieved by tomorrow #10 with no refill -We also discussed that headaches are occurring more frequently consider preventative but at this point they are relatively infrequent     I discussed the assessment and treatment plan with the patient. The patient was provided an  opportunity to ask questions and all were answered. The patient agreed with the plan and demonstrated an understanding of the instructions.   The patient was advised to call back or seek an in-person evaluation if the symptoms worsen or if the condition fails to improve as anticipated.     [name], MD

## 2020-02-04 ENCOUNTER — Other Ambulatory Visit: Payer: Self-pay | Admitting: Family Medicine

## 2020-02-11 ENCOUNTER — Other Ambulatory Visit: Payer: Self-pay | Admitting: Family Medicine

## 2020-02-11 MED ORDER — AMPHETAMINE-DEXTROAMPHETAMINE 20 MG PO TABS
ORAL_TABLET | ORAL | 0 refills | Status: DC
Start: 2020-02-11 — End: 2020-07-27

## 2020-02-11 MED ORDER — AMPHETAMINE-DEXTROAMPHETAMINE 20 MG PO TABS: ORAL_TABLET | ORAL | 0 refills | Status: DC

## 2020-02-11 MED ORDER — AMPHETAMINE-DEXTROAMPHETAMINE 20 MG PO TABS: 20.0000 mg | ORAL_TABLET | Freq: Two times a day (BID) | ORAL | 0 refills | Status: DC

## 2020-02-19 ENCOUNTER — Encounter: Payer: Self-pay | Admitting: Family Medicine

## 2020-02-19 ENCOUNTER — Telehealth (INDEPENDENT_AMBULATORY_CARE_PROVIDER_SITE_OTHER): Payer: Self-pay | Admitting: Family Medicine

## 2020-02-19 ENCOUNTER — Other Ambulatory Visit: Payer: Self-pay | Admitting: Family Medicine

## 2020-02-19 VITALS — Ht 65.0 in | Wt 115.0 lb

## 2020-02-19 DIAGNOSIS — G43009 Migraine without aura, not intractable, without status migrainosus: Secondary | ICD-10-CM

## 2020-02-19 MED ORDER — HYDROCODONE-ACETAMINOPHEN 5-325 MG PO TABS: ORAL_TABLET | ORAL | 0 refills | Status: DC

## 2020-02-19 MED ORDER — ELETRIPTAN HYDROBROMIDE 40 MG PO TABS: ORAL_TABLET | ORAL | 2 refills | Status: DC

## 2020-02-19 MED ORDER — PROPRANOLOL HCL ER 60 MG PO CP24: 60.0000 mg | ORAL_CAPSULE | Freq: Every day | ORAL | 2 refills | Status: DC

## 2020-02-19 MED ORDER — ONDANSETRON 4 MG PO TBDP: 4.0000 mg | ORAL_TABLET | Freq: Three times a day (TID) | ORAL | 0 refills | Status: AC | PRN

## 2020-02-19 NOTE — Progress Notes (Signed)
Patient ID: Leslie [name], female   DOB: [birth year], 29 y.o.   MRN: [id number]  This visit type was conducted due to national recommendations for restrictions regarding the COVID-19 pandemic in an effort to limit this patient's exposure and mitigate transmission in our community.   Virtual Visit via Video Note  I connected with Aleeyah Bensen on 02/19/20 at  5:30 PM EST by a video enabled telemedicine application and verified that I am speaking with the correct person using two identifiers.  Location patient: home Location provider:work or home office Persons participating in the virtual visit: patient, provider  I discussed the limitations of evaluation and management by telemedicine and the availability of in person appointments. The patient expressed understanding and agreed to proceed.   HPI: Leslie [name] called to discuss migraine headaches. She has had these for several years. She is not for sure regarding triggers but these seem to occur frequently after her menses. She has been on Imitrex for couple years but recently it seems to not be working as well even when she takes this early in the course of migraine. Does usually get some relief but not always complete relief. She has frequent associated nausea. Onset of migraine earlier today. She would like to discuss possible prophylaxis. She has not taken prophylactic medications previously.  She estimates she is getting at least three migraines per month and sometimes many more. Not aware of specific food triggers.   ROS: See pertinent positives and negatives per HPI.  Past Medical History:  Diagnosis Date  . ADD 04/12/2006  . Anxiety   . ASTHMA 12/24/2009  . Depression   . IBS (irritable bowel syndrome)    NEGATIVE COLONSCOPY AND ENDOSCOPY  . INSOMNIA, TRANSIENT 12/24/2009  . PANIC ATTACK 12/24/2009  . Smoker     Past Surgical History:  Procedure Laterality Date  . COLONOSCOPY    . UPPER GASTROINTESTINAL ENDOSCOPY  2009     Family History  Problem Relation Age of Onset  . Alcohol abuse Paternal Uncle   . Mental retardation Maternal Grandmother   . Hyperlipidemia Maternal Grandfather   . Diabetes Paternal Grandfather   . Hypertension Paternal Grandfather   . Heart disease Paternal Grandfather     SOCIAL HX: current nicotine use.   Current Outpatient Medications:  .  acetaminophen (TYLENOL) 500 MG tablet, Take 500 mg by mouth every 6 (six) hours as needed for moderate pain., Disp: , Rfl:  .  albuterol (PROVENTIL HFA;VENTOLIN HFA) 108 (90 Base) MCG/ACT inhaler, Inhale 2 puffs into the lungs every 6 (six) hours as needed for wheezing or shortness of breath., Disp: , Rfl:  .  amphetamine-dextroamphetamine (ADDERALL) 20 MG tablet, Take 1 tablet (20 mg total) by mouth 2 (two) times daily., Disp: 60 tablet, Rfl: 0 .  amphetamine-dextroamphetamine (ADDERALL) 20 MG tablet, Take one tablet twice daily.  May refill in one month., Disp: 60 tablet, Rfl: 0 .  amphetamine-dextroamphetamine (ADDERALL) 20 MG tablet, Take one tablet twice daily.  May refill in two months., Disp: 60 tablet, Rfl: 0 .  clonazePAM (KLONOPIN) 0.5 MG tablet, Take 1 tablet (0.5 mg total) by mouth 2 (two) times daily., Disp: 60 tablet, Rfl: 3 .  HYDROcodone-acetaminophen (NORCO/VICODIN) 5-325 MG tablet, Take one to two tablets every 6 hours as needed for severe migraine., Disp: 6 tablet, Rfl: 0 .  naproxen (NAPROSYN) 500 MG tablet, Take 1 tablet (500 mg total) by mouth 2 (two) times daily as needed., Disp: 30 tablet, Rfl: 0 .  Norethindrone Acetate-Ethinyl Estradiol (  HAILEY 1.5/30) 1.5-30 MG-MCG tablet, Take 1 tablet by mouth daily., Disp: 21 tablet, Rfl: 11 .  ondansetron (ZOFRAN ODT) 4 MG disintegrating tablet, Take 1 tablet (4 mg total) by mouth every 8 (eight) hours as needed for nausea or vomiting., Disp: 15 tablet, Rfl: 0 .  traMADol (ULTRAM) 50 MG tablet, TAKE ONE TABLET BY MOUTH EVERY 6 HOURS AS NEEDED FOR UP TO 5 DAYS, Disp: 20 tablet, Rfl:  0 .  eletriptan (RELPAX) 40 MG tablet, May repeat in 2 hours if headache persists or recurs., Disp: 10 tablet, Rfl: 2 .  propranolol ER (INDERAL LA) 60 MG 24 hr capsule, Take 1 capsule (60 mg total) by mouth daily., Disp: 30 capsule, Rfl: 2  EXAM:  VITALS per patient if applicable:  GENERAL: alert, oriented, appears well and in no acute distress  HEENT: atraumatic, conjunttiva clear, no obvious abnormalities on inspection of external nose and ears  NECK: normal movements of the head and neck  LUNGS: on inspection no signs of respiratory distress, breathing rate appears normal, no obvious gross SOB, gasping or wheezing  CV: no obvious cyanosis  MS: moves all visible extremities without noticeable abnormality  PSYCH/NEURO: pleasant and cooperative, no obvious depression or anxiety, speech and thought processing grossly intact  ASSESSMENT AND PLAN:  Discussed the following assessment and plan:  Migraine headache without aura. Increased frequency. Triggers unclear.  -We discussed discontinuing Imitrex as this seems to not be working as well at this time. Consider trial of another triptan and will try Relpax 40 mg at onset of migraine and may repeat once in 2 hours as needed  -Discuss preventative medications. She is leery of using antidepressants (eg Effexor) because of her past history of possible bipolar. We discussed consideration for Inderal LA 60 mg once daily. She is aware this may take several weeks to see much effect  -Refill Zofran 4 mg ODT to use as needed for nausea. Wrote for very limited hydrocodone 5 mg number 6 tablets take 1-2 only for severe migraine if not relieved with the above     I discussed the assessment and treatment plan with the patient. The patient was provided an opportunity to ask questions and all were answered. The patient agreed with the plan and demonstrated an understanding of the instructions.   The patient was advised to call back or seek an  in-person evaluation if the symptoms worsen or if the condition fails to improve as anticipated.     Leslie Peat, MD

## 2020-02-20 NOTE — Telephone Encounter (Signed)
These were already addressed in visit yesterday

## 2020-02-24 ENCOUNTER — Encounter: Payer: Self-pay | Admitting: Family Medicine

## 2020-02-24 ENCOUNTER — Other Ambulatory Visit: Payer: Self-pay

## 2020-02-24 ENCOUNTER — Ambulatory Visit (INDEPENDENT_AMBULATORY_CARE_PROVIDER_SITE_OTHER): Payer: Self-pay | Admitting: Family Medicine

## 2020-02-24 VITALS — BP 130/80 | HR 102 | Temp 98.0°F | Ht 65.0 in | Wt 114.0 lb

## 2020-02-24 DIAGNOSIS — R42 Dizziness and giddiness: Secondary | ICD-10-CM

## 2020-02-24 DIAGNOSIS — R6889 Other general symptoms and signs: Secondary | ICD-10-CM

## 2020-02-24 NOTE — Patient Instructions (Signed)
Try to stay on the Inderal for another week- unless symptoms become intolerable.

## 2020-02-24 NOTE — Progress Notes (Signed)
Established Patient Office Visit  Subjective:  Patient ID: Leslie [name], female    DOB: 12/05/1990  Age: 29 y.o. MRN: [id number]  CC:  Chief Complaint  Patient presents with  . Altered Mental Status    HPI Leslie [name] presents for vague symptoms of some mild body aches and cold intolerance past couple days.  Chief complaint listed "altered mental status ".  She does not have any true altered mental status but states she still foggy in her thinking.  She was started recently on Inderal LA for migraine prophylaxis.  She is not describing any consistent orthostatic symptoms.  No fevers or chills.  No cough.  No other respiratory symptoms.  Past Medical History:  Diagnosis Date  . ADD 04/12/2006  . Anxiety   . ASTHMA 12/24/2009  . Depression   . IBS (irritable bowel syndrome)    NEGATIVE COLONSCOPY AND ENDOSCOPY  . INSOMNIA, TRANSIENT 12/24/2009  . PANIC ATTACK 12/24/2009  . Smoker     Past Surgical History:  Procedure Laterality Date  . COLONOSCOPY    . UPPER GASTROINTESTINAL ENDOSCOPY  2009    Family History  Problem Relation Age of Onset  . Alcohol abuse Paternal Uncle   . Mental retardation Maternal Grandmother   . Hyperlipidemia Maternal Grandfather   . Diabetes Paternal Grandfather   . Hypertension Paternal Grandfather   . Heart disease Paternal Grandfather     Social History   Socioeconomic History  . Marital status: Single    Spouse name: Not on file  . Number of children: 0  . Years of education: Not on file  . Highest education level: Not on file  Occupational History  . Occupation: Research scientist (medical)    Comment: Health visitor  Tobacco Use  . Smoking status: Current Every Day Smoker    Packs/day: 0.50    Years: 7.00    Pack years: 3.50    Types: Cigarettes  . Smokeless tobacco: Never Used  Vaping Use  . Vaping Use: Never used  Substance and Sexual Activity  . Alcohol use: Not Currently  . Drug use: Not Currently  . Sexual activity: Not  Currently    Birth control/protection: Pill  Other Topics Concern  . Not on file  Social History Narrative   ** Merged History Encounter **       Pt is a 29 year old female who lives with mother and father.  Employed at [employer] and also goes to school full-time at [employer]  Currently on leave of absence.   Social Determinants of Health   Financial Resource Strain:   . Difficulty of Paying Living Expenses: Not on file  Food Insecurity:   . Worried About Programme researcher, broadcasting/film/video in the Last Year: Not on file  . Ran Out of Food in the Last Year: Not on file  Transportation Needs:   . Lack of Transportation (Medical): Not on file  . Lack of Transportation (Non-Medical): Not on file  Physical Activity:   . Days of Exercise per Week: Not on file  . Minutes of Exercise per Session: Not on file  Stress:   . Feeling of Stress : Not on file  Social Connections:   . Frequency of Communication with Friends and Family: Not on file  . Frequency of Social Gatherings with Friends and Family: Not on file  . Attends Religious Services: Not on file  . Active Member of Clubs or Organizations: Not on file  . Attends Banker  Meetings: Not on file  . Marital Status: Not on file  Intimate Partner Violence:   . Fear of Current or Ex-Partner: Not on file  . Emotionally Abused: Not on file  . Physically Abused: Not on file  . Sexually Abused: Not on file    Outpatient Medications Prior to Visit  Medication Sig Dispense Refill  . acetaminophen (TYLENOL) 500 MG tablet Take 500 mg by mouth every 6 (six) hours as needed for moderate pain.    Marland Kitchen albuterol (PROVENTIL HFA;VENTOLIN HFA) 108 (90 Base) MCG/ACT inhaler Inhale 2 puffs into the lungs every 6 (six) hours as needed for wheezing or shortness of breath.    . amphetamine-dextroamphetamine (ADDERALL) 20 MG tablet Take 1 tablet (20 mg total) by mouth 2 (two) times daily. 60 tablet 0  . amphetamine-dextroamphetamine (ADDERALL) 20 MG tablet Take one  tablet twice daily.  May refill in one month. 60 tablet 0  . amphetamine-dextroamphetamine (ADDERALL) 20 MG tablet Take one tablet twice daily.  May refill in two months. 60 tablet 0  . clonazePAM (KLONOPIN) 0.5 MG tablet Take 1 tablet (0.5 mg total) by mouth 2 (two) times daily. 60 tablet 3  . eletriptan (RELPAX) 40 MG tablet May repeat in 2 hours if headache persists or recurs. 10 tablet 2  . HYDROcodone-acetaminophen (NORCO/VICODIN) 5-325 MG tablet Take one to two tablets every 6 hours as needed for severe migraine. 6 tablet 0  . naproxen (NAPROSYN) 500 MG tablet Take 1 tablet (500 mg total) by mouth 2 (two) times daily as needed. 30 tablet 0  . Norethindrone Acetate-Ethinyl Estradiol (HAILEY 1.5/30) 1.5-30 MG-MCG tablet Take 1 tablet by mouth daily. 21 tablet 11  . ondansetron (ZOFRAN ODT) 4 MG disintegrating tablet Take 1 tablet (4 mg total) by mouth every 8 (eight) hours as needed for nausea or vomiting. 15 tablet 0  . propranolol ER (INDERAL LA) 60 MG 24 hr capsule Take 1 capsule (60 mg total) by mouth daily. 30 capsule 2  . traMADol (ULTRAM) 50 MG tablet TAKE ONE TABLET BY MOUTH EVERY 6 HOURS AS NEEDED FOR UP TO 5 DAYS 20 tablet 0   No facility-administered medications prior to visit.    Allergies  Allergen Reactions  . Prednisone Anaphylaxis  . Buspirone     "made her feel weird"  . Carafate [Sucralfate] Hives  . Meloxicam Other (See Comments)    GI upset    ROS Review of Systems  Constitutional: Positive for fatigue.  Eyes: Negative for visual disturbance.  Respiratory: Negative for cough and shortness of breath.   Cardiovascular: Negative for chest pain.  Endocrine: Positive for cold intolerance.  Neurological: Negative for dizziness, seizures, syncope, speech difficulty, weakness and headaches.  Psychiatric/Behavioral: Negative for confusion.      Objective:    Physical Exam Vitals reviewed.  Constitutional:      Appearance: Normal appearance.  Cardiovascular:      Rate and Rhythm: Normal rate and regular rhythm.  Pulmonary:     Effort: Pulmonary effort is normal.     Breath sounds: Normal breath sounds.  Musculoskeletal:     Right lower leg: No edema.     Left lower leg: No edema.  Neurological:     General: No focal deficit present.     Mental Status: She is alert and oriented to person, place, and time.     Cranial Nerves: No cranial nerve deficit.     Motor: No weakness.     Coordination: Coordination normal.  Gait: Gait normal.     Pulse (!) 102   Temp 98 F (36.7 C)   Ht 5\' 5"  (1.651 m)   Wt 114 lb (51.7 kg)   SpO2 97%   BMI 18.97 kg/m  Wt Readings from Last 3 Encounters:  02/24/20 114 lb (51.7 kg)  02/19/20 115 lb (52.2 kg)  01/28/20 115 lb (52.2 kg)     Health Maintenance Due  Topic Date Due  . Hepatitis C Screening  Never done  . COVID-19 Vaccine (1) Never done  . PAP SMEAR-Modifier  02/24/2018    There are no preventive care reminders to display for this patient.  Lab Results  Component Value Date   TSH 0.97 06/13/2017   Lab Results  Component Value Date   WBC 10.4 07/03/2019   HGB 14.0 07/03/2019   HCT 40.8 07/03/2019   MCV 94.6 07/03/2019   PLT 291.0 07/03/2019   Lab Results  Component Value Date   NA 138 07/05/2018   K 2.7 (LL) 07/05/2018   CO2 21 (L) 07/05/2018   GLUCOSE 88 07/05/2018   BUN 6 07/05/2018   CREATININE 0.71 07/05/2018   BILITOT 0.3 07/03/2019   ALKPHOS 56 07/03/2019   AST 18 07/03/2019   ALT 17 07/03/2019   PROT 7.5 07/03/2019   ALBUMIN 4.9 07/03/2019   CALCIUM 10.2 07/05/2018   ANIONGAP 13 07/05/2018   GFR 95.00 08/14/2017   Lab Results  Component Value Date   CHOL 174 02/18/2015   Lab Results  Component Value Date   HDL 58.50 02/18/2015   Lab Results  Component Value Date   LDLCALC 97 02/18/2015   Lab Results  Component Value Date   TRIG 90.0 02/18/2015   Lab Results  Component Value Date   CHOLHDL 3 02/18/2015   No results found for: HGBA1C      Assessment & Plan:   Problem List Items Addressed This Visit    None    Visit Diagnoses    Cold intolerance    -  Primary   Relevant Orders   TSH   Dizziness       Relevant Orders   CBC with Differential/Platelet    Patient presents with some vague symptoms of dizziness and cold intolerance after starting recent beta-blocker for migraine prevention.  She does not have any evidence for Raynaud's which can sometimes be seen with beta-blockers.  No bradycardia on exam and no orthostatic changes.  Seated blood pressure 122/78 and standing 130/80  -Check TSH and CBC with differential -We recommend she stay well-hydrated -Give this another week and if still having symptoms at that point consider discontinuation of beta-blocker  No orders of the defined types were placed in this encounter.   Follow-up: No follow-ups on file.    13/12/2014, MD

## 2020-02-25 LAB — CBC WITH DIFFERENTIAL/PLATELET
Absolute Monocytes: 560 cells/uL (ref 200–950)
Basophils Absolute: 70 cells/uL (ref 0–200)
Basophils Relative: 0.7 %
Eosinophils Absolute: 250 cells/uL (ref 15–500)
Eosinophils Relative: 2.5 %
HCT: 42.2 % (ref 35.0–45.0)
Hemoglobin: 14.1 g/dL (ref 11.7–15.5)
Lymphs Abs: 3880 cells/uL (ref 850–3900)
MCH: 31.3 pg (ref 27.0–33.0)
MCHC: 33.4 g/dL (ref 32.0–36.0)
MCV: 93.6 fL (ref 80.0–100.0)
MPV: 11.9 fL (ref 7.5–12.5)
Monocytes Relative: 5.6 %
Neutro Abs: 5240 cells/uL (ref 1500–7800)
Neutrophils Relative %: 52.4 %
Platelets: 286 10*3/uL (ref 140–400)
RBC: 4.51 10*6/uL (ref 3.80–5.10)
RDW: 12 % (ref 11.0–15.0)
Total Lymphocyte: 38.8 %
WBC: 10 10*3/uL (ref 3.8–10.8)

## 2020-02-25 LAB — TSH: TSH: 1.43 mIU/L

## 2020-03-10 ENCOUNTER — Other Ambulatory Visit: Payer: Self-pay | Admitting: Family Medicine

## 2020-03-11 ENCOUNTER — Other Ambulatory Visit: Payer: Self-pay | Admitting: Family Medicine

## 2020-03-11 NOTE — Telephone Encounter (Signed)
Refill okay with 3 additional refills

## 2020-04-08 ENCOUNTER — Encounter: Payer: Self-pay | Admitting: Family Medicine

## 2020-04-08 ENCOUNTER — Telehealth (INDEPENDENT_AMBULATORY_CARE_PROVIDER_SITE_OTHER): Payer: Self-pay | Admitting: Family Medicine

## 2020-04-08 DIAGNOSIS — R0989 Other specified symptoms and signs involving the circulatory and respiratory systems: Secondary | ICD-10-CM

## 2020-04-08 MED ORDER — HYDROCODONE-HOMATROPINE 5-1.5 MG/5ML PO SYRP: 5.0000 mL | ORAL_SOLUTION | Freq: Four times a day (QID) | ORAL | 0 refills | Status: DC | PRN

## 2020-04-08 MED ORDER — ALBUTEROL SULFATE HFA 108 (90 BASE) MCG/ACT IN AERS: 2.0000 | INHALATION_SPRAY | Freq: Four times a day (QID) | RESPIRATORY_TRACT | 1 refills | Status: AC | PRN

## 2020-04-08 NOTE — Progress Notes (Signed)
Patient ID: Leslie [name], female   DOB: [birth year], 29 y.o.   MRN: [id number]  This visit type was conducted due to national recommendations for restrictions regarding the COVID-19 pandemic in an effort to limit this patient's exposure and mitigate transmission in our community.   Virtual Visit via Video Note  I connected with Saramarie Stinger on 04/08/20 at  3:15 PM EST by a video enabled telemedicine application and verified that I am speaking with the correct person using two identifiers.  Location patient: home Location provider:work or home office Persons participating in the virtual visit: patient, provider  I discussed the limitations of evaluation and management by telemedicine and the availability of in person appointments. The patient expressed understanding and agreed to proceed.   HPI:  Delbra called with onset couple days ago of some sinus congestion, sore throat, cough, headache and low-grade fever around 99.7.  Her father just tested positive for Covid.  She lives in the same house at this time.  She is not any nausea or vomiting.  She does have history of some mild intermittent wheezing and is requesting refill of albuterol.  She has been intolerant of prednisone previously.  Denies any active wheezing at this time.  She does have a history of smoking.  Also requesting cough suppressant for nighttime use.  Her cough has not been relieved with over-the-counter medications.  ROS: See pertinent positives and negatives per HPI.  Past Medical History:  Diagnosis Date   ADD 04/12/2006   Anxiety    ASTHMA 12/24/2009   Depression    IBS (irritable bowel syndrome)    NEGATIVE COLONSCOPY AND ENDOSCOPY   INSOMNIA, TRANSIENT 12/24/2009   PANIC ATTACK 12/24/2009   Smoker     Past Surgical History:  Procedure Laterality Date   COLONOSCOPY     UPPER GASTROINTESTINAL ENDOSCOPY  2009    Family History  Problem Relation Age of Onset   Alcohol abuse Paternal Uncle     Mental retardation Maternal Grandmother    Hyperlipidemia Maternal Grandfather    Diabetes Paternal Grandfather    Hypertension Paternal Grandfather    Heart disease Paternal Grandfather     SOCIAL HX: Current smoker   Current Outpatient Medications:    HYDROcodone-homatropine (HYCODAN) 5-1.5 MG/5ML syrup, Take 5 mLs by mouth every 6 (six) hours as needed for up to 10 days., Disp: 120 mL, Rfl: 0   acetaminophen (TYLENOL) 500 MG tablet, Take 500 mg by mouth every 6 (six) hours as needed for moderate pain., Disp: , Rfl:    albuterol (VENTOLIN HFA) 108 (90 Base) MCG/ACT inhaler, Inhale 2 puffs into the lungs every 6 (six) hours as needed for wheezing or shortness of breath., Disp: 8 g, Rfl: 1   amphetamine-dextroamphetamine (ADDERALL) 20 MG tablet, Take 1 tablet (20 mg total) by mouth 2 (two) times daily., Disp: 60 tablet, Rfl: 0   amphetamine-dextroamphetamine (ADDERALL) 20 MG tablet, Take one tablet twice daily.  May refill in one month., Disp: 60 tablet, Rfl: 0   amphetamine-dextroamphetamine (ADDERALL) 20 MG tablet, Take one tablet twice daily.  May refill in two months., Disp: 60 tablet, Rfl: 0   clonazePAM (KLONOPIN) 0.5 MG tablet, Take 1 tablet (0.5 mg total) by mouth 2 (two) times daily., Disp: 60 tablet, Rfl: 3   eletriptan (RELPAX) 40 MG tablet, May repeat in 2 hours if headache persists or recurs., Disp: 10 tablet, Rfl: 2   HYDROcodone-acetaminophen (NORCO/VICODIN) 5-325 MG tablet, Take one to two tablets every 6 hours as needed  for severe migraine., Disp: 6 tablet, Rfl: 0   naproxen (NAPROSYN) 500 MG tablet, Take 1 tablet (500 mg total) by mouth 2 (two) times daily as needed., Disp: 30 tablet, Rfl: 0   Norethindrone Acetate-Ethinyl Estradiol (HAILEY 1.5/30) 1.5-30 MG-MCG tablet, Take 1 tablet by mouth daily., Disp: 21 tablet, Rfl: 11   ondansetron (ZOFRAN ODT) 4 MG disintegrating tablet, Take 1 tablet (4 mg total) by mouth every 8 (eight) hours as needed for nausea  or vomiting., Disp: 15 tablet, Rfl: 0   propranolol ER (INDERAL LA) 60 MG 24 hr capsule, Take 1 capsule (60 mg total) by mouth daily., Disp: 30 capsule, Rfl: 2   SUMAtriptan (IMITREX) 100 MG tablet, TAKE 1 TABLET BY MOUTH AT ONSET OF HEADACHE. MAY REPEAT 1 TABLET IN 2 HOURS IF NEEDED, Disp: 9 tablet, Rfl: 3   traMADol (ULTRAM) 50 MG tablet, TAKE ONE TABLET BY MOUTH EVERY 6 HOURS AS NEEDED FOR UP TO 5 DAYS, Disp: 20 tablet, Rfl: 0  EXAM:  VITALS per patient if applicable:  GENERAL: alert, oriented, appears well and in no acute distress  HEENT: atraumatic, conjunttiva clear, no obvious abnormalities on inspection of external nose and ears  NECK: normal movements of the head and neck  LUNGS: on inspection no signs of respiratory distress, breathing rate appears normal, no obvious gross SOB, gasping or wheezing  CV: no obvious cyanosis  MS: moves all visible extremities without noticeable abnormality  PSYCH/NEURO: pleasant and cooperative, no obvious depression or anxiety, speech and thought processing grossly intact  ASSESSMENT AND PLAN:  Discussed the following assessment and plan:  Upper respiratory symptoms with cough.  Fairly strong clinical suspicion for Covid.  Patient is in no respiratory distress  -She attempted to get tested but unable to get scheduled locally -Recommend plenty of fluids and rest -If possible, monitor oxygen levels with pulse oximeter -Refill albuterol for as needed use -Hycodan cough syrup 1 teaspoon every 6 hours as needed for severe cough -Follow-up promptly for increased shortness of breath or other concerns     I discussed the assessment and treatment plan with the patient. The patient was provided an opportunity to ask questions and all were answered. The patient agreed with the plan and demonstrated an understanding of the instructions.   The patient was advised to call back or seek an in-person evaluation if the symptoms worsen or if the  condition fails to improve as anticipated.     [name], MD

## 2020-04-09 ENCOUNTER — Encounter: Payer: Self-pay | Admitting: Family Medicine

## 2020-04-13 MED ORDER — HYDROCODONE-HOMATROPINE 5-1.5 MG/5ML PO SYRP: 5.0000 mL | ORAL_SOLUTION | Freq: Four times a day (QID) | ORAL | 0 refills | Status: DC | PRN

## 2020-04-13 NOTE — Telephone Encounter (Signed)
I re-sent in rx to the [facility] on [address].

## 2020-04-14 MED ORDER — HYDROCODONE-HOMATROPINE 5-1.5 MG/5ML PO SYRP: 5.0000 mL | ORAL_SOLUTION | Freq: Four times a day (QID) | ORAL | 0 refills | Status: AC | PRN

## 2020-04-14 NOTE — Addendum Note (Signed)
Addended by: Kristian Covey on: 04/14/2020 08:26 PM   Modules accepted: Orders

## 2020-04-14 NOTE — Telephone Encounter (Signed)
I have sent this in (again- now 4th time).  If this pharmacy is out we will need to print out prescription and have her take with her .

## 2020-05-08 ENCOUNTER — Other Ambulatory Visit: Payer: Self-pay | Admitting: Family Medicine

## 2020-05-08 ENCOUNTER — Encounter: Payer: Self-pay | Admitting: Family Medicine

## 2020-05-08 MED ORDER — HYDROCODONE-ACETAMINOPHEN 5-325 MG PO TABS: ORAL_TABLET | ORAL | 0 refills | Status: DC

## 2020-05-08 NOTE — Telephone Encounter (Signed)
Pt is calling stating that her Rx Hydrocodone has gone to the incorrect pharmacy and she had requested it to go to [facility] at [facility] w/ the phone number of [phone] pt did not know the complete address and fax number but confirmed that it is only one there.

## 2020-05-08 NOTE — Telephone Encounter (Signed)
Called [facility] and cancelled the Hydrocodone prescription.

## 2020-05-11 ENCOUNTER — Other Ambulatory Visit: Payer: Self-pay | Admitting: Family Medicine

## 2020-05-12 MED ORDER — AMPHETAMINE-DEXTROAMPHETAMINE 20 MG PO TABS
20.0000 mg | ORAL_TABLET | Freq: Two times a day (BID) | ORAL | 0 refills | Status: DC
Start: 2020-05-12 — End: 2020-07-27

## 2020-05-13 ENCOUNTER — Other Ambulatory Visit: Payer: Self-pay | Admitting: Family Medicine

## 2020-05-20 ENCOUNTER — Other Ambulatory Visit: Payer: Self-pay | Admitting: Family Medicine

## 2020-06-03 ENCOUNTER — Other Ambulatory Visit: Payer: Self-pay | Admitting: Family Medicine

## 2020-06-09 ENCOUNTER — Other Ambulatory Visit: Payer: Self-pay | Admitting: Family Medicine

## 2020-06-10 ENCOUNTER — Other Ambulatory Visit: Payer: Self-pay | Admitting: Family Medicine

## 2020-06-10 NOTE — Telephone Encounter (Signed)
Last refill- 05/08/2020--10 tabs with no refills Last office visit- 04/08/2020  No future appointment scheduled

## 2020-07-05 ENCOUNTER — Other Ambulatory Visit: Payer: Self-pay | Admitting: Family Medicine

## 2020-07-06 NOTE — Telephone Encounter (Signed)
Last filled 05/08/2020 Last OV 02/23/2021 (acute)  Ok to fill?

## 2020-07-14 ENCOUNTER — Other Ambulatory Visit: Payer: Self-pay | Admitting: Family Medicine

## 2020-07-14 NOTE — Telephone Encounter (Signed)
Last filled 05/08/2020 Last OV 11/06/2019  Ok to fill?

## 2020-07-27 ENCOUNTER — Other Ambulatory Visit: Payer: Self-pay | Admitting: Family Medicine

## 2020-07-27 MED ORDER — AMPHETAMINE-DEXTROAMPHETAMINE 20 MG PO TABS: ORAL_TABLET | ORAL | 0 refills | Status: DC

## 2020-07-27 MED ORDER — AMPHETAMINE-DEXTROAMPHETAMINE 20 MG PO TABS: 20.0000 mg | ORAL_TABLET | Freq: Two times a day (BID) | ORAL | 0 refills | Status: DC

## 2020-07-27 NOTE — Telephone Encounter (Signed)
Last filled 05/12/2020 Last OV 04/08/2020  Ok to fill?

## 2020-08-04 ENCOUNTER — Encounter: Payer: Self-pay | Admitting: Family Medicine

## 2020-08-07 MED ORDER — AMPHETAMINE-DEXTROAMPHETAMINE 20 MG PO TABS: 20.0000 mg | ORAL_TABLET | Freq: Two times a day (BID) | ORAL | 0 refills | Status: DC

## 2020-08-07 NOTE — Telephone Encounter (Signed)
I sent in a one month supply  

## 2020-08-09 ENCOUNTER — Other Ambulatory Visit: Payer: Self-pay | Admitting: Family Medicine

## 2020-08-10 MED ORDER — AMPHETAMINE-DEXTROAMPHETAMINE 20 MG PO TABS: 20.0000 mg | ORAL_TABLET | Freq: Two times a day (BID) | ORAL | 0 refills | Status: DC

## 2020-08-10 NOTE — Addendum Note (Signed)
Addended by: Kristian Covey on: 08/10/2020 08:34 PM   Modules accepted: Orders

## 2020-08-10 NOTE — Telephone Encounter (Signed)
Patient has refills on file. Please deny this request to remove it from the basket.

## 2020-08-10 NOTE — Telephone Encounter (Signed)
Please advise.  I spoke with the pharmacy in [place], they stated the previous Rx was sent to [facility] and that we would have to send a prescription to their location in [facility] for the patient to get this filled.

## 2020-08-13 ENCOUNTER — Other Ambulatory Visit: Payer: Self-pay | Admitting: Family Medicine

## 2020-08-13 NOTE — Telephone Encounter (Signed)
Last filled 06/12/2020 Last OV 02/24/2020 Ok to fill?

## 2020-08-17 MED ORDER — TRAMADOL HCL 50 MG PO TABS: ORAL_TABLET | ORAL | 0 refills | Status: DC

## 2020-08-25 ENCOUNTER — Encounter: Payer: Self-pay | Admitting: Family Medicine

## 2020-08-25 NOTE — Telephone Encounter (Signed)
Last filled 05/20/2020 Last OV 04/08/2020 (acute)  Ok to fill?

## 2020-08-26 MED ORDER — CLONAZEPAM 0.5 MG PO TABS: 0.5000 mg | ORAL_TABLET | Freq: Two times a day (BID) | ORAL | 1 refills | Status: DC

## 2020-08-26 NOTE — Telephone Encounter (Signed)
Rx sent 

## 2020-10-19 ENCOUNTER — Encounter: Payer: Self-pay | Admitting: Family Medicine

## 2020-10-19 ENCOUNTER — Other Ambulatory Visit: Payer: Self-pay | Admitting: Family Medicine

## 2020-10-20 NOTE — Telephone Encounter (Signed)
Last filled 08/26/2020 Last OV 04/08/2020  Ok to fill?

## 2020-11-05 ENCOUNTER — Other Ambulatory Visit: Payer: Self-pay | Admitting: Family Medicine

## 2020-11-06 ENCOUNTER — Other Ambulatory Visit: Payer: Self-pay

## 2020-11-07 MED ORDER — AMPHETAMINE-DEXTROAMPHETAMINE 20 MG PO TABS: 20.0000 mg | ORAL_TABLET | Freq: Two times a day (BID) | ORAL | 0 refills | Status: DC

## 2020-11-07 MED ORDER — AMPHETAMINE-DEXTROAMPHETAMINE 20 MG PO TABS: ORAL_TABLET | ORAL | 0 refills | Status: DC

## 2020-11-09 NOTE — Telephone Encounter (Signed)
ATC, unable to leave a voice mail.  

## 2020-11-10 NOTE — Telephone Encounter (Signed)
Left message for patient to return call.

## 2020-11-11 NOTE — Telephone Encounter (Signed)
Left message for patient to call back. Unable to reach the patient.   Please deny this medication to remove it from the basket. Her Pharmacy will notify her when this is denied.

## 2021-01-20 ENCOUNTER — Other Ambulatory Visit: Payer: Self-pay | Admitting: Family Medicine

## 2021-01-21 NOTE — Telephone Encounter (Signed)
Last filled 10/20/2020 Last OV 02/23/2021  Ok to fill?

## 2021-04-26 ENCOUNTER — Telehealth: Payer: Self-pay | Admitting: Family Medicine

## 2021-04-26 NOTE — Telephone Encounter (Signed)
Patient is requesting a refill for clonazePAM (KLONOPIN) 0.5 MG tablet [[id number]] to be sent to her pharmacy.  Patient could be contacted at [phone].  Please advise.

## 2021-04-26 NOTE — Telephone Encounter (Signed)
Last filled 01/21/2021 Last OV 04/08/2020  Ok to fill?

## 2021-04-26 NOTE — Telephone Encounter (Signed)
Verify pharmacy 

## 2021-04-26 NOTE — Telephone Encounter (Signed)
Left message for patient to call back  

## 2021-04-27 NOTE — Telephone Encounter (Signed)
ATC, unable to leave a message.  

## 2021-04-28 NOTE — Telephone Encounter (Signed)
Left message for patient to call back  

## 2021-04-30 NOTE — Telephone Encounter (Signed)
Left message for patient to call back. Unable to reach the patient. Message will be closed.  

## 2021-05-04 ENCOUNTER — Other Ambulatory Visit: Payer: Self-pay | Admitting: Family Medicine

## 2021-05-05 NOTE — Telephone Encounter (Signed)
Last filled 01/21/2021 Last OV 02/24/2020  Ok to fill?

## 2021-05-05 NOTE — Telephone Encounter (Signed)
Patient called to follow up on refill for clonazePAM (KLONOPIN) 0.5 MG tablet. I asked patient if Dr.Burchette was still her pcp since she was in [place] now. She stated that she had went to a different office to establish care but they would not write her a prescription (note: When speaking with Dr.Parker earlier she stated she did not feel comfortable refilling it due to it being her first visit, and urged her to contact our office or psychiatry) and they should have called earlier to let us know. Patient states that she does not like how their office is set up because it is students in their residency and they cannot write the prescription so they have to take it to the head doctor to write it. They wouldn't even refill her birth control. She stated that she will be looking for a new primary care office and is looking into a psychiatry office in [place] as well, but wanted to know if there was any way Dr.Burchette would fill it until she can find a new provider because she is worried about stopping it abruptly.      Please advise

## 2021-05-05 NOTE — Telephone Encounter (Signed)
Please advise. Patient has a new primary care provider in [place] but she will not prescribe this.

## 2021-05-05 NOTE — Telephone Encounter (Signed)
Dr. Viviano Simas called because patient was seen yesterday in her office. Patient asked for refill on clonazePAM (KLONOPIN) 0.5 MG tablet, but Dr.Parker did not feel comfortable refilling it and advised for patient to contact our office. Dr.Parker is located in [place].      Dr.Parker's number is [phone]    Please advise

## 2021-05-06 NOTE — Telephone Encounter (Signed)
I refilled the Klonopin.   We did not have the Olanzepine on her med list.

## 2021-06-24 ENCOUNTER — Encounter: Payer: Self-pay | Admitting: Family Medicine

## 2021-06-28 ENCOUNTER — Telehealth: Payer: Self-pay | Admitting: Family Medicine

## 2021-06-28 NOTE — Telephone Encounter (Signed)
Lvm for patient to schedule an appt due to last appt being in 2021 ?

## 2021-07-28 ENCOUNTER — Encounter: Payer: Self-pay | Admitting: Family Medicine

## 2021-07-28 ENCOUNTER — Telehealth (INDEPENDENT_AMBULATORY_CARE_PROVIDER_SITE_OTHER): Payer: Self-pay | Admitting: Family Medicine

## 2021-07-28 VITALS — Ht 65.0 in | Wt 114.0 lb

## 2021-07-28 DIAGNOSIS — R87613 High grade squamous intraepithelial lesion on cytologic smear of cervix (HGSIL): Secondary | ICD-10-CM

## 2021-07-28 DIAGNOSIS — F988 Other specified behavioral and emotional disorders with onset usually occurring in childhood and adolescence: Secondary | ICD-10-CM

## 2021-07-28 MED ORDER — AMPHETAMINE-DEXTROAMPHETAMINE 10 MG PO TABS: 10.0000 mg | ORAL_TABLET | Freq: Two times a day (BID) | ORAL | 0 refills | Status: DC

## 2021-07-28 NOTE — Progress Notes (Signed)
Patient ID: Leslie [name], female   DOB: [birth year], 31 y.o.   MRN: [id number] ? ? ?This visit type was conducted due to national recommendations for restrictions regarding the COVID-19 pandemic in an effort to limit this patient's exposure and mitigate transmission in our community.  ? ?Virtual Visit via Video Note ? ?I connected with Leslie [name] on 07/28/21 at  5:00 PM EDT by a video enabled telemedicine application and verified that I am speaking with the correct person using two identifiers. ? Location patient: home ?Location provider:work or home office ?Persons participating in the virtual visit: patient, provider ? ?I discussed the limitations of evaluation and management by telemedicine and the availability of in person appointments. The patient expressed understanding and agreed to proceed. ? ? ?HPI: ? ?Leslie [name] called to discuss ADD medications.  She is still living up in [place].  She had difficulty establishing with primary up there.  She did get follow-up physical with GYN and had abnormal Pap smear in January.  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  She has pending follow-up with GYN oncologist for further evaluation. ? ?She has longstanding history of ADD.  Has previously taken Adderall 20 mg twice daily.  Appetite and weight stable.  She would like to consider lower dose of 10 mg.  She recently has been taking 10 mg and feels like that is helping her to get through her workday. ? ? ?ROS: See pertinent positives and negatives per HPI. ? ?Past Medical History:  ?Diagnosis Date  ? ADD 04/12/2006  ? Anxiety   ? ASTHMA 12/24/2009  ? Depression   ? IBS (irritable bowel syndrome)   ? NEGATIVE COLONSCOPY AND ENDOSCOPY  ? INSOMNIA, TRANSIENT 12/24/2009  ? PANIC ATTACK 12/24/2009  ? Smoker   ? ? ?Past Surgical History:  ?Procedure Laterality Date  ? COLONOSCOPY    ? UPPER GASTROINTESTINAL ENDOSCOPY  2009   ? ? ?Family History  ?Problem Relation Age of Onset  ? Alcohol abuse Paternal Uncle   ? Mental retardation Maternal Grandmother   ? Hyperlipidemia Maternal Grandfather   ? Diabetes Paternal Grandfather   ? Hypertension Paternal Grandfather   ? Heart disease Paternal Grandfather   ? ? ?SOCIAL HX: Ongoing nicotine use ? ? ?Current Outpatient Medications:  ?  acetaminophen (TYLENOL) 500 MG tablet, Take 500 mg by mouth every 6 (six) hours as needed for moderate pain., Disp: , Rfl:  ?  albuterol (VENTOLIN HFA) 108 (90 Base) MCG/ACT inhaler, Inhale 2 puffs into the lungs every 6 (six) hours as needed for wheezing or shortness of breath., Disp: 8 g, Rfl: 1 ?  amphetamine-dextroamphetamine (ADDERALL) 10 MG tablet, Take 1 tablet (10 mg total) by mouth 2 (two) times daily., Disp: 60 tablet, Rfl: 0 ?  clonazePAM (KLONOPIN) 0.5 MG tablet, TAKE ONE TABLET BY MOUTH TWICE A DAY, Disp: 60 tablet, Rfl: 2 ?  naproxen (NAPROSYN) 500 MG tablet, Take 1 tablet (500 mg total) by mouth 2 (two) times daily as needed., Disp: 30 tablet, Rfl: 0 ?  Norethindrone Acetate-Ethinyl Estradiol (HAILEY 1.5/30) 1.5-30 MG-MCG tablet, Take 1 tablet by mouth daily., Disp: 21 tablet, Rfl: 11 ?  ondansetron (ZOFRAN ODT) 4 MG disintegrating tablet, Take 1 tablet (4 mg total) by mouth every 8 (eight) hours as needed for nausea or vomiting., Disp: 15 tablet, Rfl: 0 ?  propranolol ER (INDERAL LA) 60 MG 24 hr capsule, Take 1 capsule (60 mg  total) by mouth daily., Disp: 30 capsule, Rfl: 2 ?  SUMAtriptan (IMITREX) 100 MG tablet, TAKE ONE TABLET BY MOUTH AT ONSET OF HEADACHE; MAY REPEAT ONE TABLET IN 2 HOURS IF NEEDED., Disp: 10 tablet, Rfl: 6 ?  traMADol (ULTRAM) 50 MG tablet, TK 1 TABLET BY MOUTH EVERY 6 HOURS AS NEEDED FOR UP TO 5 DAYS, Disp: 20 tablet, Rfl: 0 ? ?EXAM: ? ?VITALS per patient if applicable: ? ?GENERAL: alert, oriented, appears well and in no acute distress ? ?HEENT: atraumatic, conjunttiva clear, no obvious abnormalities on inspection of external  nose and ears ? ?NECK: normal movements of the head and neck ? ?LUNGS: on inspection no signs of respiratory distress, breathing rate appears normal, no obvious gross SOB, gasping or wheezing ? ?CV: no obvious cyanosis ? ?MS: moves all visible extremities without noticeable abnormality ? ?PSYCH/NEURO: pleasant and cooperative, no obvious depression or anxiety, speech and thought processing grossly intact ? ?ASSESSMENT AND PLAN: ? ?Discussed the following assessment and plan: ? ?Adult ADD.  Patient would like to try a lower dose of Adderall at 10 mg.  We wrote for Adderall 10 mg twice daily #60.  She will give some feedback in a month or so. ? ?She is strongly advised to follow closely with GYN regarding her abnormal Pap smear.  She is currently waiting to hear back regarding appointment with GYN oncologist. ? ? ?  ?I discussed the assessment and treatment plan with the patient. The patient was provided an opportunity to ask questions and all were answered. The patient agreed with the plan and demonstrated an understanding of the instructions. ?  ?The patient was advised to call back or seek an in-person evaluation if the symptoms worsen or if the condition fails to improve as anticipated. ? ? ? ? ?Leslie Peat, MD  ? ?

## 2021-07-30 MED ORDER — AMPHETAMINE-DEXTROAMPHETAMINE 10 MG PO TABS: 10.0000 mg | ORAL_TABLET | Freq: Two times a day (BID) | ORAL | 0 refills | Status: DC

## 2021-08-10 ENCOUNTER — Encounter: Payer: Self-pay | Admitting: Family Medicine

## 2021-08-10 MED ORDER — SUMATRIPTAN SUCCINATE 100 MG PO TABS: ORAL_TABLET | ORAL | 1 refills | Status: DC

## 2021-08-21 ENCOUNTER — Other Ambulatory Visit: Payer: Self-pay | Admitting: Family Medicine

## 2021-08-23 NOTE — Telephone Encounter (Signed)
Last VV- 07/28/2021 ?Last refill- 05/06/2021--60 tabs, 2 refills ? ?No future OV scheduled. ?

## 2021-09-22 ENCOUNTER — Other Ambulatory Visit: Payer: Self-pay | Admitting: Family Medicine

## 2021-09-23 MED ORDER — AMPHETAMINE-DEXTROAMPHETAMINE 10 MG PO TABS: 10.0000 mg | ORAL_TABLET | Freq: Two times a day (BID) | ORAL | 0 refills | Status: DC

## 2021-10-12 ENCOUNTER — Other Ambulatory Visit: Payer: Self-pay | Admitting: Family Medicine

## 2021-11-26 ENCOUNTER — Other Ambulatory Visit: Payer: Self-pay | Admitting: Family Medicine

## 2021-11-26 NOTE — Telephone Encounter (Signed)
Last VV-07/28/21 Last refill-08/23/21-60 tabs, 2 refills  No future OV scheduled.

## 2021-12-13 ENCOUNTER — Other Ambulatory Visit: Payer: Self-pay | Admitting: Family Medicine

## 2021-12-14 MED ORDER — AMPHETAMINE-DEXTROAMPHETAMINE 10 MG PO TABS: 10.0000 mg | ORAL_TABLET | Freq: Two times a day (BID) | ORAL | 0 refills | Status: DC

## 2021-12-30 ENCOUNTER — Encounter: Payer: Self-pay | Admitting: Family Medicine

## 2022-02-09 ENCOUNTER — Other Ambulatory Visit: Payer: Self-pay | Admitting: Family Medicine

## 2022-02-09 MED ORDER — AMPHETAMINE-DEXTROAMPHETAMINE 10 MG PO TABS: 10.0000 mg | ORAL_TABLET | Freq: Two times a day (BID) | ORAL | 0 refills | Status: DC

## 2022-03-27 ENCOUNTER — Other Ambulatory Visit: Payer: Self-pay | Admitting: Family Medicine

## 2022-03-29 MED ORDER — AMPHETAMINE-DEXTROAMPHETAMINE 10 MG PO TABS: 10.0000 mg | ORAL_TABLET | Freq: Two times a day (BID) | ORAL | 0 refills | Status: DC

## 2022-04-04 ENCOUNTER — Other Ambulatory Visit: Payer: Self-pay | Admitting: Family Medicine

## 2022-04-25 ENCOUNTER — Other Ambulatory Visit: Payer: Self-pay | Admitting: Family Medicine

## 2022-05-05 ENCOUNTER — Other Ambulatory Visit: Payer: Self-pay | Admitting: Family Medicine

## 2022-05-06 MED ORDER — AMPHETAMINE-DEXTROAMPHETAMINE 10 MG PO TABS: 10.0000 mg | ORAL_TABLET | Freq: Two times a day (BID) | ORAL | 0 refills | Status: DC

## 2022-05-22 ENCOUNTER — Other Ambulatory Visit: Payer: Self-pay | Admitting: Family Medicine

## 2022-06-14 ENCOUNTER — Telehealth (INDEPENDENT_AMBULATORY_CARE_PROVIDER_SITE_OTHER): Payer: Self-pay | Admitting: Family Medicine

## 2022-06-14 ENCOUNTER — Encounter: Payer: Self-pay | Admitting: Family Medicine

## 2022-06-14 VITALS — Ht 65.0 in | Wt 114.0 lb

## 2022-06-14 DIAGNOSIS — F988 Other specified behavioral and emotional disorders with onset usually occurring in childhood and adolescence: Secondary | ICD-10-CM

## 2022-06-14 DIAGNOSIS — F411 Generalized anxiety disorder: Secondary | ICD-10-CM

## 2022-06-14 DIAGNOSIS — G43009 Migraine without aura, not intractable, without status migrainosus: Secondary | ICD-10-CM

## 2022-06-14 MED ORDER — AMPHETAMINE-DEXTROAMPHETAMINE 10 MG PO TABS: 10.0000 mg | ORAL_TABLET | Freq: Two times a day (BID) | ORAL | 0 refills | Status: DC

## 2022-06-14 NOTE — Progress Notes (Signed)
Patient ID: Kennedie Jankowiak, female   DOB: October 07, 1990, 32 y.o.   MRN: [id number]   Virtual Visit via Video Note  I connected with Thelma Barge on 06/14/22 at  3:30 PM EST by a video enabled telemedicine application and verified that I am speaking with the correct person using two identifiers.  Location patient: home Location provider:work or home office Persons participating in the virtual visit: patient, provider  I discussed the limitations of evaluation and management by telemedicine and the availability of in person appointments. The patient expressed understanding and agreed to proceed.   HPI:  Jalyn set up virtual for follow-up.  She is currently living up in [place] just outside of [place].  Generally doing well.  She is working full-time and has been going to [employer] regularly.  Her past medical history significant for migraine without aura, ADD, recurrent depression, generalized anxiety.  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  She had LEEP procedure within the past year but has had follow-up Pap smears since then.  She is getting yearly Pap smear follow-up.  Regarding ADD she is on low-dose Adderall 10 mg twice daily.  Weight is stable.  No history of hypertension.  No chest pains.  She is pleased with current dosage.  Needs refills at this time.  Longstanding history of migraine headaches.  These seem to be more frequent and warm months.  She does recall trying other preventatives such as Effexor but had suicidal thoughts.  She took propranolol at 1 point without much improvement.  She specifically has interest in CGRP class but has not looked into insurance coverage.  She is not a great candidate for Topamax since she is childbearing age-even though she is currently on oral contraception  She has longstanding history of anxiety and is currently on low-dose Klonopin and has been on this  for years.  Anxiety symptoms stable.   ROS: See pertinent positives and negatives per HPI.  Past Medical History:  Diagnosis Date   ADD 04/12/2006   Anxiety    ASTHMA 12/24/2009   Depression    IBS (irritable bowel syndrome)    NEGATIVE COLONSCOPY AND ENDOSCOPY   INSOMNIA, TRANSIENT 12/24/2009   PANIC ATTACK 12/24/2009   Smoker     Past Surgical History:  Procedure Laterality Date   COLONOSCOPY     UPPER GASTROINTESTINAL ENDOSCOPY  2009    Family History  Problem Relation Age of Onset   Alcohol abuse Paternal Uncle    Mental retardation Maternal Grandmother    Hyperlipidemia Maternal Grandfather    Diabetes Paternal Grandfather    Hypertension Paternal Grandfather    Heart disease Paternal Grandfather     SOCIAL HX: Single.  Non-smoker.   Current Outpatient Medications:    acetaminophen (TYLENOL) 500 MG tablet, Take 500 mg by mouth every 6 (six) hours as needed for moderate pain., Disp: , Rfl:    albuterol (VENTOLIN HFA) 108 (90 Base) MCG/ACT inhaler, Inhale 2 puffs into the lungs every 6 (six) hours as needed for wheezing or shortness of breath., Disp: 8 g, Rfl: 1   amphetamine-dextroamphetamine (ADDERALL) 10 MG tablet, Take 1 tablet (10 mg total) by mouth 2 (two) times daily., Disp: 60 tablet, Rfl: 0   clonazePAM (KLONOPIN) 0.5 MG tablet, TAKE 1 TABLET BY MOUTH TWICE A DAY, Disp: 60 tablet, Rfl: 3   naproxen (NAPROSYN) 500 MG tablet, Take 1 tablet (500 mg total) by mouth 2 (two)  times daily as needed., Disp: 30 tablet, Rfl: 0   Norethindrone Acetate-Ethinyl Estradiol (HAILEY 1.5/30) 1.5-30 MG-MCG tablet, Take 1 tablet by mouth daily., Disp: 21 tablet, Rfl: 11   ondansetron (ZOFRAN ODT) 4 MG disintegrating tablet, Take 1 tablet (4 mg total) by mouth every 8 (eight) hours as needed for nausea or vomiting., Disp: 15 tablet, Rfl: 0   propranolol ER (INDERAL LA) 60 MG 24 hr capsule, Take 1 capsule (60 mg total) by mouth daily., Disp: 30 capsule, Rfl: 2   SUMAtriptan (IMITREX)  100 MG tablet, TAKE 1 TABLET BY MOUTH AT ONSET OF HEADACHE MAY REPEAT ONCE IN 2 HOURS IF NEEDED., Disp: 10 tablet, Rfl: 0   traMADol (ULTRAM) 50 MG tablet, TK 1 TABLET BY MOUTH EVERY 6 HOURS AS NEEDED FOR UP TO 5 DAYS, Disp: 20 tablet, Rfl: 0  EXAM:  VITALS per patient if applicable:  GENERAL: alert, oriented, appears well and in no acute distress  HEENT: atraumatic, conjunttiva clear, no obvious abnormalities on inspection of external nose and ears  NECK: normal movements of the head and neck  LUNGS: on inspection no signs of respiratory distress, breathing rate appears normal, no obvious gross SOB, gasping or wheezing  CV: no obvious cyanosis  MS: moves all visible extremities without noticeable abnormality  PSYCH/NEURO: pleasant and cooperative, no obvious depression or anxiety, speech and thought processing grossly intact  ASSESSMENT AND PLAN:  Discussed the following assessment and plan:  #1 history of migraine headaches without aura.  We had a long discussion regarding options.  She sometimes has up to 6-8 migraines per month but these vary considerably month-to-month.  She is aware of potential triggers.  Tried things previously including Effexor and propranolol without success.  She plans to explore CGRP class to see if she has coverage and will get back with [place]  #2 ADD.  Currently on Adderall 10 mg twice daily.  Refill sent.  Continue to monitor weight closely.  #3 history of chronic anxiety currently stable on Klonopin low-dose twice daily.  She tried multiple different SSRIs previously but had intolerance and suicidal ideation with some     I discussed the assessment and treatment plan with the patient. The patient was provided an opportunity to ask questions and all were answered. The patient agreed with the plan and demonstrated an understanding of the instructions.   The patient was advised to call back or seek an in-person evaluation if the symptoms worsen or if the  condition fails to improve as anticipated.     Carolann Littler, MD

## 2022-07-03 ENCOUNTER — Other Ambulatory Visit: Payer: Self-pay | Admitting: Family Medicine

## 2022-07-20 ENCOUNTER — Other Ambulatory Visit: Payer: Self-pay | Admitting: Family Medicine

## 2022-07-22 MED ORDER — AMPHETAMINE-DEXTROAMPHETAMINE 10 MG PO TABS: 10.0000 mg | ORAL_TABLET | Freq: Two times a day (BID) | ORAL | 0 refills | Status: DC

## 2022-08-07 ENCOUNTER — Other Ambulatory Visit: Payer: Self-pay | Admitting: Family Medicine

## 2022-08-24 ENCOUNTER — Other Ambulatory Visit: Payer: Self-pay | Admitting: Family Medicine

## 2022-08-25 MED ORDER — AMPHETAMINE-DEXTROAMPHETAMINE 10 MG PO TABS: 10.0000 mg | ORAL_TABLET | Freq: Two times a day (BID) | ORAL | 0 refills | Status: DC

## 2022-09-06 ENCOUNTER — Other Ambulatory Visit: Payer: Self-pay | Admitting: Family Medicine

## 2022-09-07 MED ORDER — CLONAZEPAM 0.5 MG PO TABS: 0.5000 mg | ORAL_TABLET | Freq: Two times a day (BID) | ORAL | 0 refills | Status: DC

## 2022-09-26 ENCOUNTER — Other Ambulatory Visit: Payer: Self-pay | Admitting: Family Medicine

## 2022-09-28 MED ORDER — SUMATRIPTAN SUCCINATE 100 MG PO TABS: ORAL_TABLET | ORAL | 0 refills | Status: DC

## 2022-10-06 ENCOUNTER — Other Ambulatory Visit: Payer: Self-pay | Admitting: Adult Health

## 2022-10-06 MED ORDER — CLONAZEPAM 0.5 MG PO TABS: 0.5000 mg | ORAL_TABLET | Freq: Two times a day (BID) | ORAL | 2 refills | Status: DC

## 2022-10-11 ENCOUNTER — Other Ambulatory Visit: Payer: Self-pay | Admitting: Family Medicine

## 2022-10-11 MED ORDER — AMPHETAMINE-DEXTROAMPHETAMINE 10 MG PO TABS: 10.0000 mg | ORAL_TABLET | Freq: Two times a day (BID) | ORAL | 0 refills | Status: DC

## 2022-10-25 ENCOUNTER — Other Ambulatory Visit: Payer: Self-pay | Admitting: Family Medicine

## 2022-11-23 ENCOUNTER — Other Ambulatory Visit: Payer: Self-pay | Admitting: Family Medicine

## 2022-11-24 MED ORDER — AMPHETAMINE-DEXTROAMPHETAMINE 10 MG PO TABS: 10.0000 mg | ORAL_TABLET | Freq: Two times a day (BID) | ORAL | 0 refills | Status: DC

## 2022-12-29 ENCOUNTER — Other Ambulatory Visit: Payer: Self-pay | Admitting: Family Medicine

## 2023-01-01 MED ORDER — AMPHETAMINE-DEXTROAMPHETAMINE 10 MG PO TABS: 10.0000 mg | ORAL_TABLET | Freq: Two times a day (BID) | ORAL | 0 refills | Status: DC

## 2023-01-01 NOTE — Telephone Encounter (Signed)
Refilled Adderall, but she really probably needs to establish primary care with someone up in [place] if she is going to remain there.  Kristian Covey MD [facility] at [facility]

## 2023-01-04 ENCOUNTER — Other Ambulatory Visit: Payer: Self-pay | Admitting: Family Medicine

## 2023-02-14 ENCOUNTER — Other Ambulatory Visit: Payer: Self-pay | Admitting: Family Medicine

## 2023-02-16 MED ORDER — AMPHETAMINE-DEXTROAMPHETAMINE 10 MG PO TABS: 10.0000 mg | ORAL_TABLET | Freq: Two times a day (BID) | ORAL | 0 refills | Status: DC

## 2023-03-24 ENCOUNTER — Other Ambulatory Visit: Payer: Self-pay | Admitting: Family Medicine

## 2023-03-24 MED ORDER — AMPHETAMINE-DEXTROAMPHETAMINE 10 MG PO TABS: 10.0000 mg | ORAL_TABLET | Freq: Two times a day (BID) | ORAL | 0 refills | Status: DC

## 2023-03-24 NOTE — Telephone Encounter (Signed)
I refilled once, but she will need office follow up prior to further refills.   We are no longer able to do virtual follow ups for out of state locations.

## 2023-04-10 ENCOUNTER — Other Ambulatory Visit: Payer: Self-pay | Admitting: Family Medicine

## 2023-04-11 NOTE — Telephone Encounter (Signed)
 I refilled once, but she will either need to set up office follow up (virtual no longer allowed since she is out of state--[place]) or establish with new primary provider in [place].    Kristian Covey MD [facility] at [facility]

## 2023-04-11 NOTE — Telephone Encounter (Signed)
 Okay for refill?

## 2023-05-16 ENCOUNTER — Encounter: Payer: Self-pay | Admitting: Family Medicine

## 2023-05-16 ENCOUNTER — Telehealth (HOSPITAL_COMMUNITY): Payer: Self-pay

## 2023-05-16 ENCOUNTER — Ambulatory Visit (INDEPENDENT_AMBULATORY_CARE_PROVIDER_SITE_OTHER): Payer: BC Managed Care – PPO | Admitting: Family Medicine

## 2023-05-16 VITALS — BP 116/74 | HR 97 | Temp 98.2°F | Ht 65.0 in | Wt 103.1 lb

## 2023-05-16 DIAGNOSIS — F909 Attention-deficit hyperactivity disorder, unspecified type: Secondary | ICD-10-CM

## 2023-05-16 DIAGNOSIS — F411 Generalized anxiety disorder: Secondary | ICD-10-CM | POA: Diagnosis not present

## 2023-05-16 DIAGNOSIS — F39 Unspecified mood [affective] disorder: Secondary | ICD-10-CM

## 2023-05-16 MED ORDER — AMPHETAMINE-DEXTROAMPHETAMINE 10 MG PO TABS: 10.0000 mg | ORAL_TABLET | Freq: Two times a day (BID) | ORAL | 0 refills | Status: DC

## 2023-05-16 MED ORDER — CLONAZEPAM 0.5 MG PO TABS: 0.5000 mg | ORAL_TABLET | Freq: Two times a day (BID) | ORAL | 5 refills | Status: DC

## 2023-05-16 NOTE — Telephone Encounter (Signed)
 Pharmacy Patient Advocate Encounter   Received notification from CoverMyMeds that prior authorization for  Amphetamine -Dextroamphetamine  10MG  tablets is required/requested.   Insurance verification completed.   The patient is insured through [id number] .   Per test claim: PA required; PA started via CoverMyMeds. KEY [id number] . Waiting for clinical questions to populate.

## 2023-05-16 NOTE — Progress Notes (Signed)
 Established Patient Office Visit  Subjective   Patient ID: Leslie [name], female    DOB: 05-27-1990  Age: 33 y.o. MRN: [id number]  Chief Complaint  Patient presents with   Medication Refill    HPI   Leslie [name] is seen for medical follow-up.  She is currently living up in [place]  and has tried to find a primary care provider there but has had some difficulties.  She states that most of the people she contacted were booking over a year in advance.  She does have a GYN that she is seeing.  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  She has history of migraine headaches currently not taking any medication.  She is on daily birth control and feels like that is helped her migraines significantly.  She does have history of ADD on low-dose Adderall 10 mg twice daily.  Needs refills.  Appetite and weight stable.  No cardiac issues.  She has longstanding history of recurrent depression.    At 1 point she was on mood stabilizer with Zyprexa  and recalls that that worked well for her mood swings.  Although no definitive diagnosis of bipolar disorder she did feel more agitated when taking SSRIs in isolation without mood stabilizer.  She feels like some of her depression is seasonal.  She had been on various SSRIs but felt like they increased her agitation at times (as above).  She usually notices significant improvement in her mood around spring time.  Past Medical History:  Diagnosis Date   ADD 04/12/2006   Anxiety    ASTHMA 12/24/2009   Depression    IBS (irritable bowel syndrome)    NEGATIVE COLONSCOPY AND ENDOSCOPY   INSOMNIA, TRANSIENT 12/24/2009   PANIC ATTACK 12/24/2009   Smoker    Past Surgical History:  Procedure Laterality Date   COLONOSCOPY     UPPER GASTROINTESTINAL ENDOSCOPY  2009    reports that she has been smoking cigarettes. She has a 3.5 pack-year smoking history. She has never used smokeless tobacco.  She reports that she does not currently use alcohol. She reports that she does not currently use drugs. family history includes Alcohol abuse in her paternal uncle; Diabetes in her paternal grandfather; Heart disease in her paternal grandfather; Hyperlipidemia in her maternal grandfather; Hypertension in her paternal grandfather; Mental retardation in her maternal grandmother. Allergies  Allergen Reactions   Prednisone  Anaphylaxis   Buspirone     made her feel weird   Carafate  [Sucralfate ] Hives   Meloxicam  Other (See Comments)    GI upset    Review of Systems  Constitutional:  Negative for malaise/fatigue.  Eyes:  Negative for blurred vision.  Respiratory:  Negative for shortness of breath.   Cardiovascular:  Negative for chest pain.  Neurological:  Negative for dizziness, weakness and headaches.  Psychiatric/Behavioral:  Negative for substance abuse and suicidal ideas.       Objective:     BP 116/74 (BP Location: Right Arm, Patient Position: Sitting, Cuff Size: Normal)   Pulse 97   Temp 98.2 F (36.8 C) (Oral)   Ht 5' 5 (1.651 m)   Wt 103 lb 1.6 oz (46.8 kg)   LMP  (LMP Unknown) Comment: patient has period every 3 mths with BC  SpO2 99%   BMI 17.16 kg/m  BP Readings from Last 3 Encounters:  05/16/23 116/74  02/24/20 130/80  11/06/19 (!) 110/62   Wt Readings from Last 3  Encounters:  05/16/23 103 lb 1.6 oz (46.8 kg)  06/14/22 114 lb (51.7 kg)  07/28/21 114 lb (51.7 kg)      Physical Exam Vitals reviewed.  Constitutional:      General: She is not in acute distress. Cardiovascular:     Rate and Rhythm: Normal rate and regular rhythm.     Heart sounds: No murmur heard. Pulmonary:     Effort: Pulmonary effort is normal.     Breath sounds: Normal breath sounds. No wheezing or rales.  Neurological:     Mental Status: She is alert.  Psychiatric:        Mood and Affect: Mood normal.        Thought Content: Thought content normal.      No results found for  any visits on 05/16/23.    The ASCVD Risk score (Arnett DK, et al., 2019) failed to calculate for the following reasons:   The 2019 ASCVD risk score is only valid for ages 37 to 4    Assessment & Plan:   #1 history of ADD.  Stable on low-dose Adderall 10 mg twice daily.  Refills provided.  Her appetite and weight are stable.  No adverse side effects.  No history of misuse.  #2 longstanding history of chronic anxiety.  She has been on Klonopin  for years.  Did not tolerate multiple SSRIs.  Refill medication for 6 months.  #3 history of recurrent depression.  Does have some mild depression currently but states this is very common and seasonal.  No suicidal ideation.  At 1 point had taken mood stabilizer with low-dose Zyprexa  but would like to see if she can wait through to spring which is around the corner.  Previous intolerance with multiple SSRIs although she did do fairly well with combination therapy with Zyprexa  and fluoxetine  at 1 point  Wolm Scarlet, MD

## 2023-05-19 ENCOUNTER — Other Ambulatory Visit (HOSPITAL_COMMUNITY): Payer: Self-pay

## 2023-05-19 NOTE — Telephone Encounter (Signed)
 Pharmacy Patient Advocate Encounter   Received notification from  [facility] Portal that prior authorization for Amphetamine -Dextroamphetamine  10MG  tablets is required/requested.   Insurance verification completed.   The patient is insured through  [employer] [place]   .   Per test claim: PA required; PA submitted to above mentioned insurance via CoverMyMeds Key/confirmation #/EOC [facility] Status is pending

## 2023-05-19 NOTE — Telephone Encounter (Signed)
 Pharmacy Patient Advocate Encounter  Received notification from  [employer] [place]   that Prior Authorization for Amphetamine -Dextroamphetamine  10MG  tablets has been APPROVED from 05/19/23 to 05/18/24. Ran test claim, Copay is $26.49. This test claim was processed through [facility]- copay amounts may vary at other pharmacies due to pharmacy/plan contracts, or as the patient moves through the different stages of their insurance plan.   PA #/Case ID/Reference #: [id number]

## 2023-05-22 ENCOUNTER — Other Ambulatory Visit (HOSPITAL_COMMUNITY): Payer: Self-pay

## 2023-06-21 ENCOUNTER — Other Ambulatory Visit: Payer: Self-pay | Admitting: Family Medicine

## 2023-06-22 MED ORDER — AMPHETAMINE-DEXTROAMPHETAMINE 10 MG PO TABS: 10.0000 mg | ORAL_TABLET | Freq: Two times a day (BID) | ORAL | 0 refills | Status: DC

## 2023-06-24 ENCOUNTER — Other Ambulatory Visit: Payer: Self-pay | Admitting: Family Medicine

## 2023-06-26 MED ORDER — SUMATRIPTAN SUCCINATE 100 MG PO TABS: ORAL_TABLET | ORAL | 0 refills | Status: DC

## 2023-07-23 ENCOUNTER — Other Ambulatory Visit: Payer: Self-pay | Admitting: Family Medicine

## 2023-07-26 MED ORDER — SUMATRIPTAN SUCCINATE 100 MG PO TABS: ORAL_TABLET | ORAL | 0 refills | Status: DC

## 2023-08-07 ENCOUNTER — Other Ambulatory Visit: Payer: Self-pay | Admitting: Family Medicine

## 2023-08-08 MED ORDER — AMPHETAMINE-DEXTROAMPHETAMINE 10 MG PO TABS: 10.0000 mg | ORAL_TABLET | Freq: Two times a day (BID) | ORAL | 0 refills | Status: DC

## 2023-08-19 ENCOUNTER — Other Ambulatory Visit: Payer: Self-pay | Admitting: Family Medicine

## 2023-09-13 ENCOUNTER — Telehealth: Payer: Self-pay

## 2023-09-13 NOTE — Telephone Encounter (Signed)
 Copied from CRM [id number]. Topic: Clinical - Medication Question >> Sep 13, 2023  4:08 PM Adonis Hoot wrote: Reason for CRM:  [id number] [place] - [address]  Phone: [phone] Fax: [phone]  Called in requesting a phone call regarding medication clonazePAM  (KLONOPIN ) 0.5 MG tablet with a few questions to clear a few red flags on patients file regarding controlled substance.

## 2023-09-13 NOTE — Telephone Encounter (Signed)
 I spoke with [facility] with [facility] and provided clinical information in regards to Clonazepam  prescription. Chelsea inquired of DX code for Clonazepam  and she wanted to make sure provider was aware patient was on both Adderall and Clonazepam  which she assumed provider was aware of as PCP prescribes both of these controlled substances. Chelsea reported that DX code is needed whenever sending controlled substance in the future.

## 2023-09-18 ENCOUNTER — Encounter: Payer: Self-pay | Admitting: Family Medicine

## 2023-09-19 MED ORDER — AMPHETAMINE-DEXTROAMPHETAMINE 10 MG PO TABS: 10.0000 mg | ORAL_TABLET | Freq: Two times a day (BID) | ORAL | 0 refills | Status: DC

## 2023-09-19 NOTE — Telephone Encounter (Signed)
 Adderall refill sent  Marquetta Sit MD [facility]

## 2023-10-29 ENCOUNTER — Encounter: Payer: Self-pay | Admitting: Family Medicine

## 2023-10-30 MED ORDER — AMPHETAMINE-DEXTROAMPHETAMINE 10 MG PO TABS: 10.0000 mg | ORAL_TABLET | Freq: Two times a day (BID) | ORAL | 0 refills | Status: DC

## 2023-11-09 ENCOUNTER — Other Ambulatory Visit: Payer: Self-pay | Admitting: Family Medicine

## 2023-12-12 MED ORDER — AMPHETAMINE-DEXTROAMPHETAMINE 10 MG PO TABS: 10.0000 mg | ORAL_TABLET | Freq: Two times a day (BID) | ORAL | 0 refills | Status: DC

## 2023-12-12 NOTE — Addendum Note (Signed)
 Addended by: MICHEAL WOLM ORN on: 12/12/2023 08:07 AM   Modules accepted: Orders

## 2023-12-12 NOTE — Addendum Note (Signed)
 Addended by: METTA KRISTEN CROME on: 12/12/2023 07:54 AM   Modules accepted: Orders

## 2024-01-23 ENCOUNTER — Encounter: Payer: Self-pay | Admitting: Family Medicine

## 2024-01-23 MED ORDER — AMPHETAMINE-DEXTROAMPHETAMINE 10 MG PO TABS: 10.0000 mg | ORAL_TABLET | Freq: Two times a day (BID) | ORAL | 0 refills | Status: AC
